# Patient Record
Sex: Male | Born: 1935 | Race: White | Hispanic: No | Smoking: Never smoker
Health system: Southern US, Community
[De-identification: ages and names within clinical notes are randomized; demographics above are authoritative.]

## PROBLEM LIST (undated history)

## (undated) DIAGNOSIS — Z951 Presence of aortocoronary bypass graft: Secondary | ICD-10-CM

## (undated) DIAGNOSIS — I639 Cerebral infarction, unspecified: Secondary | ICD-10-CM

## (undated) DIAGNOSIS — N400 Enlarged prostate without lower urinary tract symptoms: Secondary | ICD-10-CM

## (undated) DIAGNOSIS — M109 Gout, unspecified: Secondary | ICD-10-CM

## (undated) DIAGNOSIS — I4891 Unspecified atrial fibrillation: Secondary | ICD-10-CM

## (undated) DIAGNOSIS — I10 Essential (primary) hypertension: Secondary | ICD-10-CM

## (undated) DIAGNOSIS — K635 Polyp of colon: Secondary | ICD-10-CM

## (undated) DIAGNOSIS — M47812 Spondylosis without myelopathy or radiculopathy, cervical region: Secondary | ICD-10-CM

## (undated) DIAGNOSIS — W19XXXA Unspecified fall, initial encounter: Secondary | ICD-10-CM

## (undated) HISTORY — PX: CORONARY ARTERY BYPASS GRAFT: SHX141

## (undated) HISTORY — PX: TONSILLECTOMY: SUR1361

## (undated) HISTORY — PX: UPPER GASTROINTESTINAL ENDOSCOPY: SHX188

## (undated) HISTORY — PX: EYE MUSCLE SURGERY: SHX370

## (undated) HISTORY — PX: APPENDECTOMY: SHX54

## (undated) HISTORY — PX: COLONOSCOPY: SHX174

---

## 2001-07-04 ENCOUNTER — Inpatient Hospital Stay (HOSPITAL_COMMUNITY): Admission: EM | Admit: 2001-07-04 | Discharge: 2001-07-08 | Payer: Self-pay | Admitting: Emergency Medicine

## 2001-07-04 ENCOUNTER — Encounter: Payer: Self-pay | Admitting: Emergency Medicine

## 2013-12-19 ENCOUNTER — Encounter (HOSPITAL_COMMUNITY): Payer: Self-pay | Admitting: Emergency Medicine

## 2013-12-19 ENCOUNTER — Emergency Department (HOSPITAL_COMMUNITY): Payer: Medicare Other

## 2013-12-19 ENCOUNTER — Emergency Department (HOSPITAL_COMMUNITY)
Admission: EM | Admit: 2013-12-19 | Discharge: 2013-12-19 | Disposition: A | Discharge status: Home / Self Care | Payer: Medicare Other | Attending: Emergency Medicine | Admitting: Emergency Medicine

## 2013-12-19 ENCOUNTER — Inpatient Hospital Stay (HOSPITAL_COMMUNITY)
Admission: EM | Admit: 2013-12-19 | Discharge: 2013-12-23 | DRG: 378 | Disposition: A | Discharge status: Skilled Nursing Facility | Payer: Medicare Other | Attending: Internal Medicine | Admitting: Internal Medicine

## 2013-12-19 ENCOUNTER — Other Ambulatory Visit: Payer: Self-pay

## 2013-12-19 DIAGNOSIS — Z951 Presence of aortocoronary bypass graft: Secondary | ICD-10-CM | POA: Insufficient documentation

## 2013-12-19 DIAGNOSIS — R55 Syncope and collapse: Secondary | ICD-10-CM

## 2013-12-19 DIAGNOSIS — N400 Enlarged prostate without lower urinary tract symptoms: Secondary | ICD-10-CM | POA: Diagnosis present

## 2013-12-19 DIAGNOSIS — Z9089 Acquired absence of other organs: Secondary | ICD-10-CM

## 2013-12-19 DIAGNOSIS — Z860100 Personal history of colon polyps, unspecified: Secondary | ICD-10-CM

## 2013-12-19 DIAGNOSIS — R51 Headache: Secondary | ICD-10-CM | POA: Insufficient documentation

## 2013-12-19 DIAGNOSIS — I10 Essential (primary) hypertension: Secondary | ICD-10-CM | POA: Insufficient documentation

## 2013-12-19 DIAGNOSIS — W19XXXA Unspecified fall, initial encounter: Secondary | ICD-10-CM

## 2013-12-19 DIAGNOSIS — W010XXA Fall on same level from slipping, tripping and stumbling without subsequent striking against object, initial encounter: Secondary | ICD-10-CM | POA: Insufficient documentation

## 2013-12-19 DIAGNOSIS — E876 Hypokalemia: Secondary | ICD-10-CM | POA: Diagnosis not present

## 2013-12-19 DIAGNOSIS — I4891 Unspecified atrial fibrillation: Secondary | ICD-10-CM

## 2013-12-19 DIAGNOSIS — S0190XA Unspecified open wound of unspecified part of head, initial encounter: Secondary | ICD-10-CM | POA: Insufficient documentation

## 2013-12-19 DIAGNOSIS — I2510 Atherosclerotic heart disease of native coronary artery without angina pectoris: Secondary | ICD-10-CM | POA: Diagnosis present

## 2013-12-19 DIAGNOSIS — D62 Acute posthemorrhagic anemia: Secondary | ICD-10-CM | POA: Diagnosis present

## 2013-12-19 DIAGNOSIS — Z79899 Other long term (current) drug therapy: Secondary | ICD-10-CM | POA: Insufficient documentation

## 2013-12-19 DIAGNOSIS — Y92009 Unspecified place in unspecified non-institutional (private) residence as the place of occurrence of the external cause: Secondary | ICD-10-CM

## 2013-12-19 DIAGNOSIS — D689 Coagulation defect, unspecified: Secondary | ICD-10-CM | POA: Diagnosis present

## 2013-12-19 DIAGNOSIS — IMO0002 Reserved for concepts with insufficient information to code with codable children: Secondary | ICD-10-CM | POA: Diagnosis present

## 2013-12-19 DIAGNOSIS — E860 Dehydration: Secondary | ICD-10-CM | POA: Diagnosis present

## 2013-12-19 DIAGNOSIS — I959 Hypotension, unspecified: Secondary | ICD-10-CM | POA: Diagnosis present

## 2013-12-19 DIAGNOSIS — Y93E1 Activity, personal bathing and showering: Secondary | ICD-10-CM

## 2013-12-19 DIAGNOSIS — S0100XA Unspecified open wound of scalp, initial encounter: Secondary | ICD-10-CM | POA: Diagnosis present

## 2013-12-19 DIAGNOSIS — Z7901 Long term (current) use of anticoagulants: Secondary | ICD-10-CM

## 2013-12-19 DIAGNOSIS — Z888 Allergy status to other drugs, medicaments and biological substances status: Secondary | ICD-10-CM

## 2013-12-19 DIAGNOSIS — E785 Hyperlipidemia, unspecified: Secondary | ICD-10-CM | POA: Diagnosis present

## 2013-12-19 DIAGNOSIS — Z8673 Personal history of transient ischemic attack (TIA), and cerebral infarction without residual deficits: Secondary | ICD-10-CM

## 2013-12-19 DIAGNOSIS — K921 Melena: Principal | ICD-10-CM | POA: Diagnosis present

## 2013-12-19 DIAGNOSIS — M109 Gout, unspecified: Secondary | ICD-10-CM | POA: Diagnosis present

## 2013-12-19 DIAGNOSIS — S0191XA Laceration without foreign body of unspecified part of head, initial encounter: Secondary | ICD-10-CM

## 2013-12-19 DIAGNOSIS — D649 Anemia, unspecified: Secondary | ICD-10-CM

## 2013-12-19 DIAGNOSIS — K922 Gastrointestinal hemorrhage, unspecified: Secondary | ICD-10-CM

## 2013-12-19 DIAGNOSIS — E861 Hypovolemia: Secondary | ICD-10-CM | POA: Diagnosis present

## 2013-12-19 DIAGNOSIS — Z882 Allergy status to sulfonamides status: Secondary | ICD-10-CM

## 2013-12-19 DIAGNOSIS — Y9389 Activity, other specified: Secondary | ICD-10-CM | POA: Insufficient documentation

## 2013-12-19 HISTORY — DX: Benign prostatic hyperplasia without lower urinary tract symptoms: N40.0

## 2013-12-19 HISTORY — DX: Gout, unspecified: M10.9

## 2013-12-19 HISTORY — DX: Spondylosis without myelopathy or radiculopathy, cervical region: M47.812

## 2013-12-19 HISTORY — DX: Unspecified atrial fibrillation: I48.91

## 2013-12-19 HISTORY — DX: Presence of aortocoronary bypass graft: Z95.1

## 2013-12-19 HISTORY — DX: Essential (primary) hypertension: I10

## 2013-12-19 HISTORY — DX: Cerebral infarction, unspecified: I63.9

## 2013-12-19 HISTORY — DX: Polyp of colon: K63.5

## 2013-12-19 LAB — COMPREHENSIVE METABOLIC PANEL
ALK PHOS: 59 U/L (ref 39–117)
ALT: 15 U/L (ref 0–53)
AST: 17 U/L (ref 0–37)
Albumin: 2.9 g/dL — ABNORMAL LOW (ref 3.5–5.2)
BUN: 57 mg/dL — ABNORMAL HIGH (ref 6–23)
CHLORIDE: 106 meq/L (ref 96–112)
CO2: 21 mEq/L (ref 19–32)
Calcium: 8.4 mg/dL (ref 8.4–10.5)
Creatinine, Ser: 1.11 mg/dL (ref 0.50–1.35)
GFR calc Af Amer: 71 mL/min — ABNORMAL LOW (ref >90)
GFR calc non Af Amer: 61 mL/min — ABNORMAL LOW (ref >90)
GLUCOSE: 168 mg/dL — AB (ref 70–99)
POTASSIUM: 3.8 meq/L (ref 3.7–5.3)
Sodium: 142 mEq/L (ref 137–147)
TOTAL PROTEIN: 5.4 g/dL — AB (ref 6.0–8.3)
Total Bilirubin: 0.4 mg/dL (ref 0.3–1.2)

## 2013-12-19 LAB — POC OCCULT BLOOD, ED: FECAL OCCULT BLD: POSITIVE — AB

## 2013-12-19 LAB — I-STAT TROPONIN, ED: Troponin i, poc: 0.02 ng/mL (ref 0.00–0.08)

## 2013-12-19 LAB — URINALYSIS, ROUTINE W REFLEX MICROSCOPIC
Bilirubin Urine: NEGATIVE
Glucose, UA: NEGATIVE mg/dL
HGB URINE DIPSTICK: NEGATIVE
Ketones, ur: NEGATIVE mg/dL
Leukocytes, UA: NEGATIVE
Nitrite: NEGATIVE
Protein, ur: NEGATIVE mg/dL
SPECIFIC GRAVITY, URINE: 1.019 (ref 1.005–1.030)
Urobilinogen, UA: 0.2 mg/dL (ref 0.0–1.0)
pH: 5 (ref 5.0–8.0)

## 2013-12-19 LAB — CBC WITH DIFFERENTIAL/PLATELET
BASOS PCT: 0 % (ref 0–1)
Basophils Absolute: 0 10*3/uL (ref 0.0–0.1)
Eosinophils Absolute: 0 10*3/uL (ref 0.0–0.7)
Eosinophils Relative: 0 % (ref 0–5)
HCT: 13.9 % — ABNORMAL LOW (ref 39.0–52.0)
HEMOGLOBIN: 4.7 g/dL — AB (ref 13.0–17.0)
LYMPHS ABS: 0.5 10*3/uL — AB (ref 0.7–4.0)
Lymphocytes Relative: 5 % — ABNORMAL LOW (ref 12–46)
MCH: 32.6 pg (ref 26.0–34.0)
MCHC: 33.8 g/dL (ref 30.0–36.0)
MCV: 96.5 fL (ref 78.0–100.0)
Monocytes Absolute: 0.5 10*3/uL (ref 0.1–1.0)
Monocytes Relative: 6 % (ref 3–12)
NEUTROS ABS: 7.8 10*3/uL — AB (ref 1.7–7.7)
NEUTROS PCT: 89 % — AB (ref 43–77)
PLATELETS: 146 10*3/uL — AB (ref 150–400)
RBC: 1.44 MIL/uL — AB (ref 4.22–5.81)
RDW: 14.8 % (ref 11.5–15.5)
WBC: 8.8 10*3/uL (ref 4.0–10.5)

## 2013-12-19 LAB — MRSA PCR SCREENING: MRSA by PCR: NEGATIVE

## 2013-12-19 LAB — PROTIME-INR
INR: 1.51 — ABNORMAL HIGH (ref 0.00–1.49)
PROTHROMBIN TIME: 17.8 s — AB (ref 11.6–15.2)

## 2013-12-19 LAB — ABO/RH: ABO/RH(D): O POS

## 2013-12-19 LAB — PREPARE RBC (CROSSMATCH)

## 2013-12-19 MED ORDER — SODIUM CHLORIDE 0.9 % IV BOLUS (SEPSIS)
1000.0000 mL | Freq: Once | INTRAVENOUS | Status: AC
  Administered 2013-12-19: 1000 mL via INTRAVENOUS

## 2013-12-19 MED ORDER — TETANUS-DIPHTH-ACELL PERTUSSIS 5-2.5-18.5 LF-MCG/0.5 IM SUSP: 0.5000 mL | Freq: Once | INTRAMUSCULAR | Status: DC

## 2013-12-19 MED ORDER — PANTOPRAZOLE SODIUM 40 MG IV SOLR: 40.0000 mg | Freq: Two times a day (BID) | INTRAVENOUS | Status: DC

## 2013-12-19 MED ORDER — SODIUM CHLORIDE 0.9 % IV SOLN
80.0000 mg | Freq: Once | INTRAVENOUS | Status: AC
  Administered 2013-12-19: 80 mg via INTRAVENOUS
  Filled 2013-12-19: qty 80

## 2013-12-19 MED ORDER — SODIUM CHLORIDE 0.9 % IV SOLN
INTRAVENOUS | Status: DC
  Administered 2013-12-19: 19:00:00 via INTRAVENOUS

## 2013-12-19 MED ORDER — SODIUM CHLORIDE 0.9 % IV SOLN
8.0000 mg/h | INTRAVENOUS | Status: DC
  Administered 2013-12-19 – 2013-12-20 (×2): 8 mg/h via INTRAVENOUS
  Filled 2013-12-19 (×5): qty 80

## 2013-12-19 MED ORDER — PANTOPRAZOLE SODIUM 40 MG PO TBEC
80.0000 mg | DELAYED_RELEASE_TABLET | Freq: Once | ORAL | Status: DC
  Administered 2013-12-19: 80 mg via ORAL
  Filled 2013-12-19: qty 2

## 2013-12-19 NOTE — ED Notes (Signed)
Per ems, pt was seen here this fmorning for a fall, has four staples in the back of head. Per ems, pt was sitting on his walker, had a possible syncopal episode. Upon ems arrival, pt was pale, BP in the 70s. Upon arrival to ED, pt color vastly improved, denies pain, pt c/o genearlized weakness. Pt did not fall, but lost control of bladder. No EKG changes by ems. Pt is AAOX4. Dr. Silverio LayYao at bedside.

## 2013-12-19 NOTE — ED Notes (Signed)
Patient transported to CT 
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
## 2013-12-19 NOTE — ED Notes (Signed)
Dr. Silverio LayYao made aware pt had a black tarry stool and it was hemocculted.
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
## 2013-12-19 NOTE — ED Notes (Signed)
Assisted Mellody DanceKeith, EMT with placing pt on bedpan
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
## 2013-12-19 NOTE — Discharge Instructions (Signed)
Keep wound clean and dry.  Take tylenol for pain.   Staple removal in a week.   Follow up with your doctor.   Return to ER if you have severe headache, fever, purulent drainage from wound.

## 2013-12-19 NOTE — ED Notes (Signed)
GI at the bedside.

## 2013-12-19 NOTE — ED Notes (Signed)
Pt on bedpan attempting to have a BM

## 2013-12-19 NOTE — ED Notes (Signed)
Spoke with Dr.McQuaid for order clarification on blood and labs. Order to transfuse 2 units PRBCs and then draw labs.

## 2013-12-19 NOTE — ED Provider Notes (Signed)
CSN: [id number]     Arrival date & time 12/19/13  1336 History   First MD Initiated Contact with Patient 12/19/13 1345     Chief Complaint  Patient presents with  . Near Syncope     (Consider location/radiation/quality/duration/timing/severity/associated sxs/prior Treatment) The history is provided by the patient.  Mahlik Lenn is a 80 y.o. male hx of afib on eliquis, CAD s/p CABG, HTN here with near syncope. He was seen earlier by me for mechanical fall and had a scalp laceration that was stapled. He went home but didn't eat much. He was walking and suddenly appeared pale and almost passed out. He urinated on himself but does not have any seizure activity. He denies any chest pain or shortness of breath. Denies any fevers. Per EMS, his BP was in the 70s on arrival.    Past Medical History  Diagnosis Date  . Atrial fibrillation   . Hx of CABG   . Hypertension    Past Surgical History  Procedure Laterality Date  . Coronary artery bypass graft     No family history on file. History  Substance Use Topics  . Smoking status: Never Smoker   . Smokeless tobacco: Not on file  . Alcohol Use: No    Review of Systems  Cardiovascular: Positive for near-syncope.  Neurological: Positive for dizziness.       Near syncope   All other systems reviewed and are negative.      Allergies  Codeine and Sulfa antibiotics  Home Medications   Current Outpatient Rx  Name  Route  Sig  Dispense  Refill  . acetaminophen (TYLENOL) 650 MG CR tablet   Oral   Take 1,300 mg by mouth 2 (two) times daily.         Marland Kitchen alfuzosin (UROXATRAL) 10 MG 24 hr tablet   Oral   Take 10 mg by mouth daily with breakfast.         . allopurinol (ZYLOPRIM) 300 MG tablet   Oral   Take 300 mg by mouth daily.         Marland Kitchen apixaban (ELIQUIS) 5 MG TABS tablet   Oral   Take 5 mg by mouth 2 (two) times daily.         Marland Kitchen diltiazem (CARDIZEM CD) 240 MG 24 hr capsule   Oral   Take 240 mg by mouth daily.          . finasteride (PROSCAR) 5 MG tablet   Oral   Take 5 mg by mouth daily.         . Iron-FA-B Cmp-C-Biot-Probiotic (FUSION PLUS) CAPS   Oral   Take 1 capsule by mouth daily.         . montelukast (SINGULAIR) 10 MG tablet   Oral   Take 10 mg by mouth daily.         . Multiple Vitamin (MULTIVITAMIN WITH MINERALS) TABS tablet   Oral   Take 1 tablet by mouth daily.         . niacin (NIASPAN) 750 MG CR tablet   Oral   Take 1,500 mg by mouth at bedtime.         Marland Kitchen omeprazole (PRILOSEC) 40 MG capsule   Oral   Take 40 mg by mouth daily.         . ramipril (ALTACE) 5 MG capsule   Oral   Take 5 mg by mouth daily.          BP 91/35  Pulse 73  Temp(Src) 98.1 F (36.7 C) (Oral)  Resp 18  SpO2 100% Physical Exam  Nursing note and vitals reviewed. Constitutional:  Chronically ill, dehydrated   HENT:  Head: Normocephalic.  MM slightly dry   Eyes: Conjunctivae are normal. Pupils are equal, round, and reactive to light.  Neck: Normal range of motion. Neck supple.  Cardiovascular: Normal rate, regular rhythm and normal heart sounds.   Pulmonary/Chest: Effort normal and breath sounds normal. No respiratory distress. He has no wheezes. He has no rales.  Abdominal: Soft. Bowel sounds are normal. He exhibits no distension. There is no tenderness. There is no rebound.  Musculoskeletal: Normal range of motion. He exhibits no edema.  Neurological: He is alert.  4/5 strength throughout. CN 2-12 intact.   Skin: Skin is warm and dry.  L scalp laceration with staples in place. R elbow abrasion.   Psychiatric: He has a normal mood and affect. His behavior is normal. Judgment and thought content normal.    ED Course  Procedures (including critical care time)  CRITICAL CARE Performed by: Silverio LayYAO, Caswell Alvillar   Total critical care time: 30 min   Critical care time was exclusive of separately billable procedures and treating other patients.  Critical care was necessary to treat or  prevent imminent or life-threatening deterioration.  Critical care was time spent personally by me on the following activities: development of treatment plan with patient and/or surrogate as well as nursing, discussions with consultants, evaluation of patient's response to treatment, examination of patient, obtaining history from patient or surrogate, ordering and performing treatments and interventions, ordering and review of laboratory studies, ordering and review of radiographic studies, pulse oximetry and re-evaluation of patient's condition.   Labs Review Labs Reviewed  POC OCCULT BLOOD, ED - Abnormal; Notable for the following:    Fecal Occult Bld POSITIVE (*)    All other components within normal limits  URINALYSIS, ROUTINE W REFLEX MICROSCOPIC  CBC WITH DIFFERENTIAL  COMPREHENSIVE METABOLIC PANEL  PROTIME-INR  I-STAT TROPOININ, ED  TYPE AND SCREEN   Imaging Review Ct Head Wo Contrast  12/19/2013   CLINICAL DATA:  Fall with posterior head injury and occipital laceration. Also complaint of neck pain.  EXAM: CT HEAD WITHOUT CONTRAST  CT CERVICAL SPINE WITHOUT CONTRAST  TECHNIQUE: Multidetector CT imaging of the head and cervical spine was performed following the standard protocol without intravenous contrast. Multiplanar CT image reconstructions of the cervical spine were also generated.  COMPARISON:  None.  FINDINGS: CT HEAD FINDINGS  The brain shows evidence of mild atrophy and mild periventricular small vessel disease. The brain demonstrates no evidence of hemorrhage, infarction, edema, mass effect, extra-axial fluid collection, hydrocephalus or mass lesion. The skull is unremarkable and shows no evidence of fracture.  CT CERVICAL SPINE FINDINGS  The cervical spine shows normal alignment. There is no evidence of acute fracture or subluxation. No soft tissue swelling or hematoma is identified. Advanced degenerative changes are present throughout the cervical spine with near complete fused  disc spaces at C3-4, C4-5, C5-6 and C6-7. Associated proliferative changes are identified as well as probable significant bony foraminal stenosis at multiple levels. No bony or soft tissue lesions are seen. The visualized airway is normally patent.  IMPRESSION: 1. No acute findings by head CT. No evidence of skull fracture or intracranial hemorrhage. 2. Advanced degenerative changes of the cervical spine without evidence of cervical fracture or subluxation.   Electronically Signed   By: Irish LackGlenn  Yamagata M.D.   On: 12/19/2013  09:31   Ct Cervical Spine Wo Contrast  12/19/2013   CLINICAL DATA:  Fall with posterior head injury and occipital laceration. Also complaint of neck pain.  EXAM: CT HEAD WITHOUT CONTRAST  CT CERVICAL SPINE WITHOUT CONTRAST  TECHNIQUE: Multidetector CT imaging of the head and cervical spine was performed following the standard protocol without intravenous contrast. Multiplanar CT image reconstructions of the cervical spine were also generated.  COMPARISON:  None.  FINDINGS: CT HEAD FINDINGS  The brain shows evidence of mild atrophy and mild periventricular small vessel disease. The brain demonstrates no evidence of hemorrhage, infarction, edema, mass effect, extra-axial fluid collection, hydrocephalus or mass lesion. The skull is unremarkable and shows no evidence of fracture.  CT CERVICAL SPINE FINDINGS  The cervical spine shows normal alignment. There is no evidence of acute fracture or subluxation. No soft tissue swelling or hematoma is identified. Advanced degenerative changes are present throughout the cervical spine with near complete fused disc spaces at C3-4, C4-5, C5-6 and C6-7. Associated proliferative changes are identified as well as probable significant bony foraminal stenosis at multiple levels. No bony or soft tissue lesions are seen. The visualized airway is normally patent.  IMPRESSION: 1. No acute findings by head CT. No evidence of skull fracture or intracranial hemorrhage.  2. Advanced degenerative changes of the cervical spine without evidence of cervical fracture or subluxation.   Electronically Signed   By: Irish Lack M.D.   On: 12/19/2013 09:31   Dg Chest Portable 1 View  12/19/2013   CLINICAL DATA:  Near syncope.  EXAM: PORTABLE CHEST - 1 VIEW  COMPARISON:  None.  FINDINGS: Borderline enlarged cardiac silhouette. Post CABG changes. Clear lungs with normal vascularity. Mild central peribronchial thickening. Minimal scoliosis.  IMPRESSION: Borderline cardiomegaly and mild chronic bronchitic changes. No acute abnormality.   Electronically Signed   By: Gordan Payment M.D.   On: 12/19/2013 14:32     EKG Interpretation   Date/Time:  Saturday December 19 2013 13:53:49 EDT Ventricular Rate:  79 PR Interval:  148 QRS Duration: 84 QT Interval:  458 QTC Calculation: 525 R Axis:   39 Text Interpretation:  Sinus rhythm Abnrm T, consider ischemia,  anterolateral lds Prolonged QT interval TWI  new since previous Confirmed  by Cailynn Bodnar  MD, Brooke Payes ([id number]) on 12/19/2013 2:19:03 PM      MDM   Final diagnoses:  None   Sahil Milner is a 80 y.o. male here with near syncope, likely from dehydration. Will get labs, orthostatics. Will give IVF and likely need admission. No repeat head injury and will no do CT head again.   3 PM Patient has melena in the ED. Occ positive. Has hx of GI bleed and just moved from [place] so has no doctors here. Will call GI, transfuse if Hg low. Orthostatic and unable to stand up. Will hydrate and reassess.   3:27 PM Still hypotensive after 2.5 L. Critical care called and recommend starting protonix drip. Blood ordered. I called [facility] for consult.    Richardean Canal, MD 12/19/13 1556

## 2013-12-19 NOTE — ED Notes (Signed)
Patient returned from CT via CT tech, A. [employer]

## 2013-12-19 NOTE — ED Provider Notes (Addendum)
CSN: [id number]     Arrival date & time 12/19/13  [id number] History   First MD Initiated Contact with Patient 12/19/13 [phone]     Chief Complaint  Patient presents with  . Head Laceration  . Fall     (Consider location/radiation/quality/duration/timing/severity/associated sxs/prior Treatment) The history is provided by the patient and the EMS personnel.  Kristopher [name] is a 80 y.o. male hx of Afib on eliquis, CAD s/p CABG, HTN here with fall. He lives at independent living and this morning got up to go use the bathroom and slipped and fell. He says he hit the left side of his head on a And it and had a laceration afterwards. EMS noted that the bleeding stopped spontaneously. Has a mild headache. Denies syncope loss consciousness. He had some mild neck pain but he said he has chronic arthritis and is not getting worse. Denies any other extremity pain or chest pain or abdominal pain. Tetanus up to date.    Past Medical History  Diagnosis Date  . Atrial fibrillation   . Hx of CABG   . Hypertension    Past Surgical History  Procedure Laterality Date  . Coronary artery bypass graft     History reviewed. No pertinent family history. History  Substance Use Topics  . Smoking status: Never Smoker   . Smokeless tobacco: Not on file  . Alcohol Use: No    Review of Systems  Skin: Positive for wound.  Neurological: Positive for headaches.  All other systems reviewed and are negative.      Allergies  Codeine and Sulfa antibiotics  Home Medications  No current outpatient prescriptions on file. Pulse 85  Temp(Src) 98.4 F (36.9 C) (Oral)  Resp 21  SpO2 100% Physical Exam  Nursing note and vitals reviewed. Constitutional: He is oriented to person, place, and time.  Slightly uncomfortable   HENT:  Head: Normocephalic.  Mouth/Throat: Oropharynx is clear and moist.  2 cm laceration L scalp, no active bleeding. Well approximated  Eyes: Conjunctivae and EOM are normal. Pupils are equal,  round, and reactive to light.  Neck: Normal range of motion. Neck supple.  ? l sided neck tenderness   Cardiovascular: Normal rate, regular rhythm and normal heart sounds.   Pulmonary/Chest: Effort normal and breath sounds normal. No respiratory distress. He has no wheezes. He has no rales.  Abdominal: Soft. Bowel sounds are normal. He exhibits no distension. There is no tenderness. There is no rebound and no guarding.  Musculoskeletal:  Pelvis stable, no spinal tenderness. No extremity trauma   Neurological: He is alert and oriented to person, place, and time.  Skin: Skin is warm and dry.  Psychiatric: He has a normal mood and affect. His behavior is normal. Judgment and thought content normal.    ED Course  Procedures (including critical care time)  LACERATION REPAIR Performed by: Chaney MallingYAO, DAVID Authorized by: Chaney MallingYAO, DAVID Consent: Verbal consent obtained. Risks and benefits: risks, benefits and alternatives were discussed Consent given by: patient Patient identity confirmed: provided demographic data Prepped and Draped in normal sterile fashion Wound explored  Laceration Location: L scalp  Laceration Length: 2 cm  No Foreign Bodies seen or palpated  Anesthesia: none   Irrigation method: syringe Amount of cleaning: standard  Skin closure: staples  Number of staples- 4   Patient tolerance: Patient tolerated the procedure well with no immediate complications.   Labs Review Labs Reviewed - No data to display Imaging Review Ct Head Wo Contrast  12/19/2013  CLINICAL DATA:  Fall with posterior head injury and occipital laceration. Also complaint of neck pain.  EXAM: CT HEAD WITHOUT CONTRAST  CT CERVICAL SPINE WITHOUT CONTRAST  TECHNIQUE: Multidetector CT imaging of the head and cervical spine was performed following the standard protocol without intravenous contrast. Multiplanar CT image reconstructions of the cervical spine were also generated.  COMPARISON:  None.  FINDINGS:  CT HEAD FINDINGS  The brain shows evidence of mild atrophy and mild periventricular small vessel disease. The brain demonstrates no evidence of hemorrhage, infarction, edema, mass effect, extra-axial fluid collection, hydrocephalus or mass lesion. The skull is unremarkable and shows no evidence of fracture.  CT CERVICAL SPINE FINDINGS  The cervical spine shows normal alignment. There is no evidence of acute fracture or subluxation. No soft tissue swelling or hematoma is identified. Advanced degenerative changes are present throughout the cervical spine with near complete fused disc spaces at C3-4, C4-5, C5-6 and C6-7. Associated proliferative changes are identified as well as probable significant bony foraminal stenosis at multiple levels. No bony or soft tissue lesions are seen. The visualized airway is normally patent.  IMPRESSION: 1. No acute findings by head CT. No evidence of skull fracture or intracranial hemorrhage. 2. Advanced degenerative changes of the cervical spine without evidence of cervical fracture or subluxation.   Electronically Signed   By: Irish Lack M.D.   On: 12/19/2013 09:31   Ct Cervical Spine Wo Contrast  12/19/2013   CLINICAL DATA:  Fall with posterior head injury and occipital laceration. Also complaint of neck pain.  EXAM: CT HEAD WITHOUT CONTRAST  CT CERVICAL SPINE WITHOUT CONTRAST  TECHNIQUE: Multidetector CT imaging of the head and cervical spine was performed following the standard protocol without intravenous contrast. Multiplanar CT image reconstructions of the cervical spine were also generated.  COMPARISON:  None.  FINDINGS: CT HEAD FINDINGS  The brain shows evidence of mild atrophy and mild periventricular small vessel disease. The brain demonstrates no evidence of hemorrhage, infarction, edema, mass effect, extra-axial fluid collection, hydrocephalus or mass lesion. The skull is unremarkable and shows no evidence of fracture.  CT CERVICAL SPINE FINDINGS  The cervical  spine shows normal alignment. There is no evidence of acute fracture or subluxation. No soft tissue swelling or hematoma is identified. Advanced degenerative changes are present throughout the cervical spine with near complete fused disc spaces at C3-4, C4-5, C5-6 and C6-7. Associated proliferative changes are identified as well as probable significant bony foraminal stenosis at multiple levels. No bony or soft tissue lesions are seen. The visualized airway is normally patent.  IMPRESSION: 1. No acute findings by head CT. No evidence of skull fracture or intracranial hemorrhage. 2. Advanced degenerative changes of the cervical spine without evidence of cervical fracture or subluxation.   Electronically Signed   By: Irish Lack M.D.   On: 12/19/2013 09:31     EKG Interpretation None      Date: 12/19/2013  Rate: 85  Rhythm: normal sinus rhythm  QRS Axis: normal  Intervals: normal  ST/T Wave abnormalities: nonspecific ST changes  Conduction Disutrbances:none  Narrative Interpretation:   Old EKG Reviewed: none available    MDM   Final diagnoses:  None    Shiraz Bastyr is a 80 y.o. male here with fall. Will do CT given that he is on eliquis. Will staple laceration.   10:06 AM CT showed no bleed. Wound stapled. Will d/c home. Staple removal in a week.    Richardean Canal, MD 12/19/13 1007  Richardean Canal, MD 12/19/13 1018

## 2013-12-19 NOTE — ED Notes (Signed)
CCM at the bedside. °

## 2013-12-20 ENCOUNTER — Encounter (HOSPITAL_COMMUNITY)
Admission: EM | Disposition: A | Discharge status: Skilled Nursing Facility | Payer: Self-pay | Source: Home / Self Care | Attending: Pulmonary Disease

## 2013-12-20 DIAGNOSIS — I959 Hypotension, unspecified: Secondary | ICD-10-CM

## 2013-12-20 HISTORY — PX: ESOPHAGOGASTRODUODENOSCOPY: SHX5428

## 2013-12-20 LAB — CBC
HEMATOCRIT: 24.5 % — AB (ref 39.0–52.0)
Hemoglobin: 8.4 g/dL — ABNORMAL LOW (ref 13.0–17.0)
MCH: 31.8 pg (ref 26.0–34.0)
MCHC: 34.3 g/dL (ref 30.0–36.0)
MCV: 92.8 fL (ref 78.0–100.0)
PLATELETS: 107 10*3/uL — AB (ref 150–400)
RBC: 2.64 MIL/uL — ABNORMAL LOW (ref 4.22–5.81)
RDW: 15.8 % — AB (ref 11.5–15.5)
WBC: 7 10*3/uL (ref 4.0–10.5)

## 2013-12-20 LAB — HEMOGLOBIN AND HEMATOCRIT, BLOOD
HCT: 17.6 % — ABNORMAL LOW (ref 39.0–52.0)
Hemoglobin: 6.1 g/dL — CL (ref 13.0–17.0)

## 2013-12-20 LAB — COMPREHENSIVE METABOLIC PANEL
ALK PHOS: 65 U/L (ref 39–117)
ALT: 21 U/L (ref 0–53)
AST: 23 U/L (ref 0–37)
Albumin: 2.4 g/dL — ABNORMAL LOW (ref 3.5–5.2)
BUN: 41 mg/dL — ABNORMAL HIGH (ref 6–23)
CHLORIDE: 110 meq/L (ref 96–112)
CO2: 21 meq/L (ref 19–32)
Calcium: 7.7 mg/dL — ABNORMAL LOW (ref 8.4–10.5)
Creatinine, Ser: 1 mg/dL (ref 0.50–1.35)
GFR calc Af Amer: 80 mL/min — ABNORMAL LOW (ref >90)
GFR, EST NON AFRICAN AMERICAN: 69 mL/min — AB (ref >90)
GLUCOSE: 106 mg/dL — AB (ref 70–99)
POTASSIUM: 3.7 meq/L (ref 3.7–5.3)
SODIUM: 141 meq/L (ref 137–147)
Total Bilirubin: 0.6 mg/dL (ref 0.3–1.2)
Total Protein: 4.7 g/dL — ABNORMAL LOW (ref 6.0–8.3)

## 2013-12-20 LAB — BASIC METABOLIC PANEL
BUN: 35 mg/dL — AB (ref 6–23)
CO2: 19 mEq/L (ref 19–32)
CREATININE: 1.03 mg/dL (ref 0.50–1.35)
Calcium: 8 mg/dL — ABNORMAL LOW (ref 8.4–10.5)
Chloride: 109 mEq/L (ref 96–112)
GFR, EST AFRICAN AMERICAN: 78 mL/min — AB (ref >90)
GFR, EST NON AFRICAN AMERICAN: 67 mL/min — AB (ref >90)
Glucose, Bld: 89 mg/dL (ref 70–99)
Potassium: 3.6 mEq/L — ABNORMAL LOW (ref 3.7–5.3)
Sodium: 141 mEq/L (ref 137–147)

## 2013-12-20 LAB — LACTIC ACID, PLASMA: Lactic Acid, Venous: 0.9 mmol/L (ref 0.5–2.2)

## 2013-12-20 LAB — PREPARE RBC (CROSSMATCH)

## 2013-12-20 LAB — MAGNESIUM: Magnesium: 1.8 mg/dL (ref 1.5–2.5)

## 2013-12-20 LAB — PHOSPHORUS: Phosphorus: 3.1 mg/dL (ref 2.3–4.6)

## 2013-12-20 SURGERY — EGD (ESOPHAGOGASTRODUODENOSCOPY)
Anesthesia: Moderate Sedation

## 2013-12-20 MED ORDER — MIDAZOLAM HCL 5 MG/ML IJ SOLN
INTRAMUSCULAR | Status: AC
  Filled 2013-12-20: qty 1

## 2013-12-20 MED ORDER — BUTAMBEN-TETRACAINE-BENZOCAINE 2-2-14 % EX AERO
INHALATION_SPRAY | CUTANEOUS | Status: DC | PRN
  Administered 2013-12-20: 2 via TOPICAL

## 2013-12-20 MED ORDER — FENTANYL CITRATE 0.05 MG/ML IJ SOLN
INTRAMUSCULAR | Status: AC
  Filled 2013-12-20: qty 2

## 2013-12-20 MED ORDER — CHLORHEXIDINE GLUCONATE 0.12 % MT SOLN
15.0000 mL | Freq: Two times a day (BID) | OROMUCOSAL | Status: DC
  Administered 2013-12-20: 15 mL via OROMUCOSAL
  Filled 2013-12-20: qty 15

## 2013-12-20 MED ORDER — LIDOCAINE VISCOUS 2 % MT SOLN
15.0000 mL | Freq: Once | OROMUCOSAL | Status: AC
  Administered 2013-12-20: 15 mL via OROMUCOSAL
  Filled 2013-12-20: qty 15

## 2013-12-20 MED ORDER — ACETAMINOPHEN 325 MG PO TABS: 650.0000 mg | ORAL_TABLET | ORAL | Status: DC | PRN

## 2013-12-20 MED ORDER — POTASSIUM CHLORIDE 10 MEQ/100ML IV SOLN
10.0000 meq | INTRAVENOUS | Status: AC
  Administered 2013-12-20 (×4): 10 meq via INTRAVENOUS
  Filled 2013-12-20 (×4): qty 100

## 2013-12-20 MED ORDER — DIPHENHYDRAMINE HCL 50 MG/ML IJ SOLN: 25.0000 mg | Freq: Four times a day (QID) | INTRAMUSCULAR | Status: DC | PRN

## 2013-12-20 MED ORDER — MIDAZOLAM HCL 10 MG/2ML IJ SOLN
INTRAMUSCULAR | Status: DC | PRN
  Administered 2013-12-20 (×2): 2 mg via INTRAVENOUS

## 2013-12-20 MED ORDER — FENTANYL CITRATE 0.05 MG/ML IJ SOLN
INTRAMUSCULAR | Status: DC | PRN
  Administered 2013-12-20 (×2): 25 ug via INTRAVENOUS

## 2013-12-20 MED ORDER — BIOTENE DRY MOUTH MT LIQD
15.0000 mL | Freq: Two times a day (BID) | OROMUCOSAL | Status: DC
  Administered 2013-12-20: 15 mL via OROMUCOSAL

## 2013-12-20 MED ORDER — PANTOPRAZOLE SODIUM 40 MG PO TBEC
40.0000 mg | DELAYED_RELEASE_TABLET | Freq: Every day | ORAL | Status: DC
  Administered 2013-12-21 – 2013-12-23 (×3): 40 mg via ORAL
  Filled 2013-12-20 (×3): qty 1

## 2013-12-20 NOTE — Op Note (Signed)
[facility] [address] [place], [id number]   ENDOSCOPY PROCEDURE REPORT  PATIENT: Kristopher CitizenBecton, Lamere  MR#: [id number] BIRTHDATE: 1932-08-18 , 81  yrs. old GENDER: Male ENDOSCOPIST: Iva Booparl E Chelcey Caputo, MD, [facility] PROCEDURE DATE:  12/20/2013 PROCEDURE:  EGD, diagnostic ASA CLASS:     Class III INDICATIONS:  GI bleed on apixaban - melena. MEDICATIONS: Fentanyl 50 mcg IV and Versed 4 mg IV TOPICAL ANESTHETIC: Cetacaine Spray  DESCRIPTION OF PROCEDURE: After the risks benefits and alternatives of the procedure were thoroughly explained, informed consent was obtained.  The Pentax Gastroscope [id number] endoscope was introduced through the mouth and advanced to the second portion of the duodenum. Without limitations.  The instrument was slowly withdrawn as the mucosa was fully examined.      The upper, middle and distal third of the esophagus were carefully inspected and no abnormalities were noted.  The z-line was well seen at the GEJ.  The endoscope was pushed into the fundus which was normal including a retroflexed view.  The antrum, gastric body, first and second part of the duodenum were unremarkable. Retroflexed views revealed no abnormalities.     The scope was then withdrawn from the patient and the procedure completed.  COMPLICATIONS: There were no complications. ENDOSCOPIC IMPRESSION: Normal EGD  RECOMMENDATIONS: Feed [facility] when he had a "Coumadin bleed" If colonoscopy was truly negative then would do a capsule endoscopy of small bowel as opposed to repeating colonoscopy Stop PPI infusion - oral PPI reasonable Stay off anti-coagulant for now   eSigned:  Iva Booparl E Jasneet Schobert, MD, [facility] 12/20/2013 11:26 AM

## 2013-12-20 NOTE — Progress Notes (Signed)
eLink Physician-Brief Progress Note Patient Name: Eulah Citizendwin Boice DOB: 12-Jun-1932 MRN: [id number]  Date of Service  12/20/2013   HPI/Events of Note   Suspected upper GI hemorrhage on Eliquis Transfused 2 units PRBC  Recent Labs Lab 12/19/13 1401 12/20/13 0025  HGB 4.7* 6.1*     eICU Interventions   Transfuse 2 more units PRBC Place NG tube    Intervention Category Major Interventions: Hemorrhage - evaluation and management  Lorri Fukuhara 12/20/2013, 1:34 AM

## 2013-12-20 NOTE — Progress Notes (Signed)
Attempted to insert NGT using lidocaine viscous by two RNs without success. Pt did not tolerate well and refused another attempt. Educated pt on the need for NGT, pt continues to refuse at this time.

## 2013-12-20 NOTE — Progress Notes (Signed)
PULMONARY / CRITICAL CARE MEDICINE   Name: Kristopher [name] MRN: [id number] DOB: 01-Jul-1932    ADMISSION DATE:  12/19/2013 CONSULTATION DATE:  3/281/5   REFERRING MD :  EDP  PRIMARY SERVICE: PCCM   CHIEF COMPLAINT:  GIB and Hypotension   BRIEF PATIENT DESCRIPTION: 80 yo [facility] pt brought to ER for syncope found to be severely anemia w/ GIB (black tarry stools) on Eliquis for chronic atrial fib. PCCM 3/28 asked to admit .   SIGNIFICANT EVENTS / STUDIES:  3/28 GI consult for GIB  LINES / TUBES: PIV  CULTURES: None  ANTIBIOTICS: None  SUBJECTIVE: No events overnight.  Black stool.  VITAL SIGNS: Temp:  [97.9 F (36.6 C)-99.4 F (37.4 C)] 98.4 F (36.9 C) (03/29 0718) Pulse Rate:  [67-89] 67 (03/29 0700) Resp:  [12-24] 15 (03/29 0700) BP: (79-137)/(34-98) 127/58 mmHg (03/29 0700) SpO2:  [91 %-100 %] 91 % (03/29 0700) Weight:  [165 lb 12.6 oz (75.2 kg)-166 lb 3.6 oz (75.4 kg)] 166 lb 3.6 oz (75.4 kg) (03/29 0245) HEMODYNAMICS:   VENTILATOR SETTINGS:   INTAKE / OUTPUT: Intake/Output     03/28 0701 - 03/29 0700 03/29 0701 - 03/30 0700   I.V. (mL/kg) 14318 (189.9)    Blood 1299    Total Intake(mL/kg) 15617 (207.1)    Urine (mL/kg/hr) 1555    Stool 1    Total Output 1556     Net +14061           PHYSICAL EXAMINATION: General:  Frail , thin elderly male , very pale  Neuro:  Intact , a/o x 3 , f/c  HEENT:  Pale and dry mucosa Cardiovascular: RRR , no m/r/g Lungs:  CTA  Abdomen: mild gen tenderness, no guarding or rebound  Musculoskeletal:  inatct  Skin:  Pale , no rash   LABS:  CBC  Recent Labs Lab 12/19/13 1401 12/20/13 0025 12/20/13 0730  WBC 8.8  --  7.0  HGB 4.7* 6.1* 8.4*  HCT 13.9* 17.6* 24.5*  PLT 146*  --  107*   Coag's  Recent Labs Lab 12/19/13 1401  INR 1.51*   BMET  Recent Labs Lab 12/19/13 1401 12/20/13 0025 12/20/13 0730  NA 142 141 141  K 3.8 3.7 3.6*  CL 106 110 109  CO2 21 21 19   BUN 57* 41* 35*  CREATININE 1.11 1.00 1.03   GLUCOSE 168* 106* 89   Electrolytes  Recent Labs Lab 12/19/13 1401 12/20/13 0025 12/20/13 0730  CALCIUM 8.4 7.7* 8.0*  MG  --  1.8  --   PHOS  --  3.1  --    Sepsis Markers  Recent Labs Lab 12/20/13 0025  LATICACIDVEN 0.9   ABG No results found for this basename: PHART, PCO2ART, PO2ART,  in the last 168 hours Liver Enzymes  Recent Labs Lab 12/19/13 1401 12/20/13 0025  AST 17 23  ALT 15 21  ALKPHOS 59 65  BILITOT 0.4 0.6  ALBUMIN 2.9* 2.4*   Cardiac Enzymes No results found for this basename: TROPONINI, PROBNP,  in the last 168 hours Glucose No results found for this basename: GLUCAP,  in the last 168 hours  Imaging Dg Pelvis 1-2 Views  12/19/2013   CLINICAL DATA:  Bilateral hip pain following a fall today. Chronic bilateral hip pain, worse on the left.  EXAM: PELVIS - 1-2 VIEW  COMPARISON:  None.  FINDINGS: Marked superior left hip joint space narrowing and lateral subluxation of the femoral head with associated subarticular sclerosis and  cystic changes on both sides of the joint space. No fracture or dislocation is seen. Diffuse osteopenia. Lower lumbar spine degenerative changes and mild scoliosis.  IMPRESSION: 1. No fracture or dislocation. 2. Marked chronic left hip degenerative changes and subluxation.   Electronically Signed   By: Gordan Payment M.D.   On: 12/19/2013 14:35   Ct Head Wo Contrast  12/19/2013   CLINICAL DATA:  Fall with posterior head injury and occipital laceration. Also complaint of neck pain.  EXAM: CT HEAD WITHOUT CONTRAST  CT CERVICAL SPINE WITHOUT CONTRAST  TECHNIQUE: Multidetector CT imaging of the head and cervical spine was performed following the standard protocol without intravenous contrast. Multiplanar CT image reconstructions of the cervical spine were also generated.  COMPARISON:  None.  FINDINGS: CT HEAD FINDINGS  The brain shows evidence of mild atrophy and mild periventricular small vessel disease. The brain demonstrates no evidence of  hemorrhage, infarction, edema, mass effect, extra-axial fluid collection, hydrocephalus or mass lesion. The skull is unremarkable and shows no evidence of fracture.  CT CERVICAL SPINE FINDINGS  The cervical spine shows normal alignment. There is no evidence of acute fracture or subluxation. No soft tissue swelling or hematoma is identified. Advanced degenerative changes are present throughout the cervical spine with near complete fused disc spaces at C3-4, C4-5, C5-6 and C6-7. Associated proliferative changes are identified as well as probable significant bony foraminal stenosis at multiple levels. No bony or soft tissue lesions are seen. The visualized airway is normally patent.  IMPRESSION: 1. No acute findings by head CT. No evidence of skull fracture or intracranial hemorrhage. 2. Advanced degenerative changes of the cervical spine without evidence of cervical fracture or subluxation.   Electronically Signed   By: Irish Lack M.D.   On: 12/19/2013 09:31   Ct Cervical Spine Wo Contrast  12/19/2013   CLINICAL DATA:  Fall with posterior head injury and occipital laceration. Also complaint of neck pain.  EXAM: CT HEAD WITHOUT CONTRAST  CT CERVICAL SPINE WITHOUT CONTRAST  TECHNIQUE: Multidetector CT imaging of the head and cervical spine was performed following the standard protocol without intravenous contrast. Multiplanar CT image reconstructions of the cervical spine were also generated.  COMPARISON:  None.  FINDINGS: CT HEAD FINDINGS  The brain shows evidence of mild atrophy and mild periventricular small vessel disease. The brain demonstrates no evidence of hemorrhage, infarction, edema, mass effect, extra-axial fluid collection, hydrocephalus or mass lesion. The skull is unremarkable and shows no evidence of fracture.  CT CERVICAL SPINE FINDINGS  The cervical spine shows normal alignment. There is no evidence of acute fracture or subluxation. No soft tissue swelling or hematoma is identified. Advanced  degenerative changes are present throughout the cervical spine with near complete fused disc spaces at C3-4, C4-5, C5-6 and C6-7. Associated proliferative changes are identified as well as probable significant bony foraminal stenosis at multiple levels. No bony or soft tissue lesions are seen. The visualized airway is normally patent.  IMPRESSION: 1. No acute findings by head CT. No evidence of skull fracture or intracranial hemorrhage. 2. Advanced degenerative changes of the cervical spine without evidence of cervical fracture or subluxation.   Electronically Signed   By: Irish Lack M.D.   On: 12/19/2013 09:31   Dg Chest Portable 1 View  12/19/2013   CLINICAL DATA:  Near syncope.  EXAM: PORTABLE CHEST - 1 VIEW  COMPARISON:  None.  FINDINGS: Borderline enlarged cardiac silhouette. Post CABG changes. Clear lungs with normal vascularity. Mild central peribronchial  thickening. Minimal scoliosis.  IMPRESSION: Borderline cardiomegaly and mild chronic bronchitic changes. No acute abnormality.   Electronically Signed   By: Gordan PaymentSteve  Reid M.D.   On: 12/19/2013 14:32   CXR: Borderline cardiomegaly and mild chronic bronchitic changes. No  acute abnormality.    ASSESSMENT / PLAN:  PULMONARY A:No apparent acute issues /Sats adequate on RA and CXR -nad   P:   Cont to monitor. D/C O2.  CARDIOVASCULAR A: Hypotension secondary to hypovolemia/GIB  Atrial Fib  P:  Hold HTN rx as b/p low  KVO IVF D/C pressors. SCD  Hold eliquis   RENAL A:  BPH  P:   Hold proscar /uroxatral as may lower b/p  Replace K  GASTROINTESTINAL A:  GIB on Eliquis  P:   GI consulted, ?EGD this AM. Cont PPI drip x72 hr..  HEMATOLOGIC A:  Severe Anemia with GIB   P:  Transfused and HG is now stable. D/C H&H q6. NPO may eat after EGD. CBC q6.  INFECTIOUS A:  No apparent infection  P:   Tr wbc/temp   ENDOCRINE A:   P:   Monitor labs closely , add SSI if hyperglycemia occurs   NEUROLOGIC A:  Fall  CT head  3/28 w/ no acute findings   P:   Monitor closely as fall this am .   TODAY'S SUMMARY: UGI bleed on eliquis.  Hg stable.  GI saw yesterday, to determine ?EGD or not.  Continue PPI.  If no EGD then begin diet or begin after EGD will defer to GI.  [facility], [facility]M will sign off, please call back if needed.  I have personally obtained a history, examined the patient, evaluated laboratory and imaging results, formulated the assessment and plan and placed orders.  Alyson ReedyWesam G. Samanthia Howland, M.D. [facility]. Pager: [phone]. After hours pager: [phone].

## 2013-12-21 ENCOUNTER — Encounter (HOSPITAL_COMMUNITY): Payer: Self-pay | Admitting: Internal Medicine

## 2013-12-21 DIAGNOSIS — I4891 Unspecified atrial fibrillation: Secondary | ICD-10-CM

## 2013-12-21 LAB — BASIC METABOLIC PANEL
BUN: 20 mg/dL (ref 6–23)
CO2: 22 mEq/L (ref 19–32)
Calcium: 8.2 mg/dL — ABNORMAL LOW (ref 8.4–10.5)
Chloride: 109 mEq/L (ref 96–112)
Creatinine, Ser: 0.92 mg/dL (ref 0.50–1.35)
GFR, EST NON AFRICAN AMERICAN: 78 mL/min — AB (ref >90)
Glucose, Bld: 97 mg/dL (ref 70–99)
POTASSIUM: 3.8 meq/L (ref 3.7–5.3)
Sodium: 141 mEq/L (ref 137–147)

## 2013-12-21 LAB — CBC
HCT: 26.9 % — ABNORMAL LOW (ref 39.0–52.0)
Hemoglobin: 9.1 g/dL — ABNORMAL LOW (ref 13.0–17.0)
MCH: 31.7 pg (ref 26.0–34.0)
MCHC: 33.8 g/dL (ref 30.0–36.0)
MCV: 93.7 fL (ref 78.0–100.0)
PLATELETS: 113 10*3/uL — AB (ref 150–400)
RBC: 2.87 MIL/uL — ABNORMAL LOW (ref 4.22–5.81)
RDW: 16.9 % — AB (ref 11.5–15.5)
WBC: 8.1 10*3/uL (ref 4.0–10.5)

## 2013-12-21 LAB — MAGNESIUM: Magnesium: 2 mg/dL (ref 1.5–2.5)

## 2013-12-21 LAB — PHOSPHORUS: PHOSPHORUS: 3.2 mg/dL (ref 2.3–4.6)

## 2013-12-21 MED ORDER — MONTELUKAST SODIUM 10 MG PO TABS
10.0000 mg | ORAL_TABLET | Freq: Every day | ORAL | Status: DC
  Administered 2013-12-21 – 2013-12-23 (×3): 10 mg via ORAL
  Filled 2013-12-21 (×3): qty 1

## 2013-12-21 MED ORDER — PEG-KCL-NACL-NASULF-NA ASC-C 100 G PO SOLR
0.5000 | Freq: Once | ORAL | Status: AC
  Administered 2013-12-21: 100 g via ORAL
  Filled 2013-12-21: qty 1

## 2013-12-21 MED ORDER — PEG-KCL-NACL-NASULF-NA ASC-C 100 G PO SOLR: 1.0000 | Freq: Once | ORAL | Status: DC

## 2013-12-21 MED ORDER — PEG-KCL-NACL-NASULF-NA ASC-C 100 G PO SOLR
0.5000 | Freq: Once | ORAL | Status: AC
  Administered 2013-12-22: 100 g via ORAL
  Filled 2013-12-21: qty 1

## 2013-12-21 MED ORDER — ALLOPURINOL 300 MG PO TABS
300.0000 mg | ORAL_TABLET | Freq: Every day | ORAL | Status: DC
  Administered 2013-12-21 – 2013-12-23 (×3): 300 mg via ORAL
  Filled 2013-12-21 (×3): qty 1

## 2013-12-21 MED ORDER — FINASTERIDE 5 MG PO TABS
5.0000 mg | ORAL_TABLET | Freq: Every day | ORAL | Status: DC
  Administered 2013-12-21 – 2013-12-23 (×3): 5 mg via ORAL
  Filled 2013-12-21 (×3): qty 1

## 2013-12-21 MED ORDER — ALFUZOSIN HCL ER 10 MG PO TB24
10.0000 mg | ORAL_TABLET | Freq: Every day | ORAL | Status: DC
  Administered 2013-12-23: 10 mg via ORAL
  Filled 2013-12-21 (×3): qty 1

## 2013-12-21 NOTE — Progress Notes (Signed)
Report called to 5W RN, [name]. Pt transferred to 5W-06 via ambulation (13800ft) and wheelchair. Belongings, family at bedside. Meds in chart, bedside handoff to [name]. No questions or complaints at this time.  [name]
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
## 2013-12-21 NOTE — Progress Notes (Signed)
Daily Rounding Note  12/21/2013, 10:50 AM  LOS: 2 days   SUBJECTIVE:       BM last night was dark.  4 today have been loose and brown.  No blood.  Tolerating solids.  No abdominal pain or n/V.  Sinus rhythm since arrival to 2300.   OBJECTIVE:         Vital signs in last 24 hours:    Temp:  [97.6 F (36.4 C)-98.7 F (37.1 C)] 97.9 F (36.6 C) (03/30 0721) Pulse Rate:  [54-87] 80 (03/30 1000) Resp:  [13-24] 24 (03/30 1000) BP: (102-154)/(44-89) 136/77 mmHg (03/30 1000) SpO2:  [90 %-100 %] 97 % (03/30 1000) Weight:  [75.2 kg (165 lb 12.6 oz)] 75.2 kg (165 lb 12.6 oz) (03/30 0500) Last BM Date: 12/20/13 General: looks pale and weak.  comfortable   Heart: RRR Chest: clear bil.  No dyspnea Abdomen: soft, NT, ND.  Active BS  Extremities: no CCE Neuro/Psych:  Pleasant, oriented x 3.  No limb weakness.   Intake/Output from previous day: 03/29 0701 - 03/30 0700 In: 775 [P.O.:240; I.V.:135; IV Piggyback:400] Out: 2500 [Urine:2500]  Intake/Output this shift: Total I/O In: -  Out: 200 [Urine:200]  Lab Results:  Recent Labs  12/19/13 1401 12/20/13 0025 12/20/13 0730 12/21/13 0245  WBC 8.8  --  7.0 8.1  HGB 4.7* 6.1* 8.4* 9.1*  HCT 13.9* 17.6* 24.5* 26.9*  PLT 146*  --  107* 113*   BMET  Recent Labs  12/20/13 0025 12/20/13 0730 12/21/13 0813  NA 141 141 141  K 3.7 3.6* 3.8  CL 110 109 109  CO2 21 19 22   GLUCOSE 106* 89 97  BUN 41* 35* 20  CREATININE 1.00 1.03 0.92  CALCIUM 7.7* 8.0* 8.2*   LFT  Recent Labs  12/19/13 1401 12/20/13 0025  PROT 5.4* 4.7*  ALBUMIN 2.9* 2.4*  AST 17 23  ALT 15 21  ALKPHOS 59 65  BILITOT 0.4 0.6   PT/INR  Recent Labs  12/19/13 1401  LABPROT 17.8*  INR 1.51*   Hepatitis Panel No results found for this basename: HEPBSAG, HCVAB, HEPAIGM, HEPBIGM,  in the last 72 hours  Studies/Results: Dg Pelvis 1-2 Views 12/19/2013   FINDINGS: Marked superior left hip  joint space narrowing and lateral subluxation of the femoral head with associated subarticular sclerosis and cystic changes on both sides of the joint space. No fracture or dislocation is seen. Diffuse osteopenia. Lower lumbar spine degenerative changes and mild scoliosis.  IMPRESSION: 1. No fracture or dislocation. 2. Marked chronic left hip degenerative changes and subluxation.   Electronically Signed   By: Gordan Payment M.D.   On: 12/19/2013 14:35   Dg Chest Portable 1 View 12/19/2013   IMPRESSION: Borderline cardiomegaly and mild chronic bronchitic changes. No acute abnormality.   Electronically Signed   By: Gordan Payment M.D.   On: 12/19/2013 14:32   Scheduled Meds: . pantoprazole  40 mg Oral QAC breakfast   Continuous Infusions:  PRN Meds:.acetaminophen, diphenhydrAMINE  GI records for [facility] 09/2012.   Workup for iron def anemia. Required transfusion and switched from Coumadin to Eliquis then.   Colonoscopy 10/20/2012: by Dr Demetrius Charity Anne Shutter.  Cecal telangectasia, internal hemorrhoids. No polyps. 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    ASSESMENT:   * GI bleed with Heme + stool in setting of chronic Apixaban.  EGD 3/29: normal. On daily  Omeprazole 40 mg at home. Currently on daily oral Protonix.  Colonoscopy 09/2012:  Cecal telangectasia and int rrhoids.   *  ABL anemia. Normocytic.  S/p PRBC x 4. Hgb improved and stable.  On po iron supplement at admission for hx IDA anemia.    *  Atrial Fib.  Chronic anticoagulation.  Abixaban on hold.      PLAN   *  Next step for eval of anemia may be capsule endo an/or repeat colonoscopy with goal of ablating cecal AVMs?      Jennye MoccasinSarah Gribbin  12/21/2013, 10:50 AM Pager: [phone]  ________________________________________________________________________  [facility] MD note:  I personally examined the patient, reviewed the data and agree with the assessment and plan described  above.  He has had 4 units blood transufion.  Appears to have stopped bleeding.  Cecal AVM described on outside colonoscoyp 09/2012, I would like to repeat colonoscopy tomorrow to treat AVMs, these may have caused his bleeding (eloquix of course, contributes).     Rob Buntinganiel Justun Anaya, MD [facility] Pager [phone]

## 2013-12-22 ENCOUNTER — Encounter (HOSPITAL_COMMUNITY)
Admission: EM | Disposition: A | Discharge status: Skilled Nursing Facility | Payer: Self-pay | Source: Home / Self Care | Attending: Pulmonary Disease

## 2013-12-22 ENCOUNTER — Encounter (HOSPITAL_COMMUNITY): Payer: Medicare Other | Admitting: Anesthesiology

## 2013-12-22 ENCOUNTER — Inpatient Hospital Stay (HOSPITAL_COMMUNITY): Payer: Medicare Other | Admitting: Anesthesiology

## 2013-12-22 ENCOUNTER — Encounter (HOSPITAL_COMMUNITY): Payer: Self-pay | Admitting: *Deleted

## 2013-12-22 HISTORY — PX: COLONOSCOPY: SHX5424

## 2013-12-22 HISTORY — PX: GIVENS CAPSULE STUDY: SHX5432

## 2013-12-22 LAB — BASIC METABOLIC PANEL
BUN: 14 mg/dL (ref 6–23)
CO2: 21 meq/L (ref 19–32)
CREATININE: 0.91 mg/dL (ref 0.50–1.35)
Calcium: 8.4 mg/dL (ref 8.4–10.5)
Chloride: 108 mEq/L (ref 96–112)
GFR calc Af Amer: >90 mL/min (ref >90)
GFR calc non Af Amer: 78 mL/min — ABNORMAL LOW (ref >90)
Glucose, Bld: 84 mg/dL (ref 70–99)
Potassium: 3.5 mEq/L — ABNORMAL LOW (ref 3.7–5.3)
Sodium: 143 mEq/L (ref 137–147)

## 2013-12-22 LAB — CBC
HEMATOCRIT: 28.1 % — AB (ref 39.0–52.0)
Hemoglobin: 9.4 g/dL — ABNORMAL LOW (ref 13.0–17.0)
MCH: 31.5 pg (ref 26.0–34.0)
MCHC: 33.5 g/dL (ref 30.0–36.0)
MCV: 94.3 fL (ref 78.0–100.0)
Platelets: 136 10*3/uL — ABNORMAL LOW (ref 150–400)
RBC: 2.98 MIL/uL — AB (ref 4.22–5.81)
RDW: 15.9 % — AB (ref 11.5–15.5)
WBC: 6 10*3/uL (ref 4.0–10.5)

## 2013-12-22 LAB — GLUCOSE, CAPILLARY: Glucose-Capillary: 95 mg/dL (ref 70–99)

## 2013-12-22 SURGERY — COLONOSCOPY
Anesthesia: General

## 2013-12-22 SURGERY — IMAGING PROCEDURE, GI TRACT, INTRALUMINAL, VIA CAPSULE
Anesthesia: LOCAL

## 2013-12-22 MED ORDER — ONDANSETRON HCL 4 MG/2ML IJ SOLN: 4.0000 mg | Freq: Once | INTRAMUSCULAR | Status: AC | PRN

## 2013-12-22 MED ORDER — POTASSIUM CHLORIDE 10 MEQ/100ML IV SOLN
10.0000 meq | INTRAVENOUS | Status: AC
  Administered 2013-12-22 (×2): 10 meq via INTRAVENOUS
  Filled 2013-12-22 (×2): qty 100

## 2013-12-22 MED ORDER — PROPOFOL INFUSION 10 MG/ML OPTIME
INTRAVENOUS | Status: DC | PRN
  Administered 2013-12-22: 50 ug/kg/min via INTRAVENOUS

## 2013-12-22 MED ORDER — PROPOFOL 10 MG/ML IV BOLUS
INTRAVENOUS | Status: DC | PRN
  Administered 2013-12-22 (×2): 20 mg via INTRAVENOUS

## 2013-12-22 MED ORDER — FENTANYL CITRATE 0.05 MG/ML IJ SOLN: 25.0000 ug | INTRAMUSCULAR | Status: DC | PRN

## 2013-12-22 MED ORDER — SODIUM CHLORIDE 0.9 % IV SOLN
INTRAVENOUS | Status: DC
  Administered 2013-12-22: 250 mL via INTRAVENOUS

## 2013-12-22 MED ORDER — POTASSIUM CHLORIDE 10 MEQ/100ML IV SOLN
10.0000 meq | INTRAVENOUS | Status: AC
  Administered 2013-12-22: 10 meq via INTRAVENOUS
  Filled 2013-12-22 (×3): qty 100

## 2013-12-22 MED ORDER — APIXABAN 5 MG PO TABS
5.0000 mg | ORAL_TABLET | Freq: Two times a day (BID) | ORAL | Status: DC
  Administered 2013-12-22 – 2013-12-23 (×2): 5 mg via ORAL
  Filled 2013-12-22 (×3): qty 1

## 2013-12-22 MED ORDER — SODIUM CHLORIDE 0.9 % IV SOLN
INTRAVENOUS | Status: DC | PRN
  Administered 2013-12-22: 14:00:00 via INTRAVENOUS

## 2013-12-22 SURGICAL SUPPLY — 1 items: TOWEL COTTON PACK 4EA (MISCELLANEOUS) ×4 IMPLANT

## 2013-12-22 NOTE — Anesthesia Preprocedure Evaluation (Addendum)
Anesthesia Evaluation  Patient identified by MRN, date of birth, ID band Patient awake    Reviewed: Allergy & Precautions, H&P , NPO status , Patient's Chart, lab work & pertinent test results  Airway Mallampati: I TM Distance: >3 FB Neck ROM: Full    Dental  (+) Upper Dentures, Lower Dentures, Dental Advisory Given   Pulmonary  breath sounds clear to auscultation        Cardiovascular hypertension, Pt. on medications + dysrhythmias Atrial Fibrillation Rhythm:Regular     Neuro/Psych    GI/Hepatic   Endo/Other    Renal/GU      Musculoskeletal   Abdominal   Peds  Hematology   Anesthesia Other Findings Hx of CABG in past. No angina or SOB now.   Reproductive/Obstetrics                         Anesthesia Physical Anesthesia Plan  ASA: III  Anesthesia Plan: General   Post-op Pain Management:    Induction: Intravenous  Airway Management Planned: LMA  Additional Equipment:   Intra-op Plan:   Post-operative Plan: Extubation in OR  Informed Consent:   Dental advisory given  Plan Discussed with: CRNA, Anesthesiologist and Surgeon  Anesthesia Plan Comments:         Anesthesia Quick Evaluation

## 2013-12-22 NOTE — Progress Notes (Signed)
          Daily Rounding Note  12/22/2013, 8:55 AM  LOS: 3 days   SUBJECTIVE:       Completed prep, no problems except the taste.  Up with assistance to bathroom, sink.  Not dizzy, feels well  OBJECTIVE:         Vital signs in last 24 hours:    Temp:  [98 F (36.7 C)-100.1 F (37.8 C)] 98.5 F (36.9 C) (03/31 0625) Pulse Rate:  [66-82] 66 (03/31 0625) Resp:  [17-24] 20 (03/31 0625) BP: (135-178)/(57-77) 152/70 mmHg (03/31 0625) SpO2:  [92 %-97 %] 92 % (03/31 0625) Weight:  [69.174 kg (152 lb 8 oz)] 69.174 kg (152 lb 8 oz) (03/31 0609) Last BM Date: 12/21/13 General: looks better, comfortable and not ill appearig   Heart: RRR Chest: clear bil.  Abdomen: soft, NT, ND, active BS  Extremities: no CCE Neuro/Psych:  Oriented x 3.  No tremor, no limb weakness.  In good spirits.   Intake/Output from previous day: 03/30 0701 - 03/31 0700 In: 960 [P.O.:960] Out: 1350 [Urine:1350]  Intake/Output this shift: Total I/O In: -  Out: 100 [Urine:100]  Lab Results:  Recent Labs  12/20/13 0730 12/21/13 0245 12/22/13 0600  WBC 7.0 8.1 6.0  HGB 8.4* 9.1* 9.4*  HCT 24.5* 26.9* 28.1*  PLT 107* 113* 136*   BMET  Recent Labs  12/20/13 0730 12/21/13 0813 12/22/13 0600  NA 141 141 143  K 3.6* 3.8 3.5*  CL 109 109 108  CO2 19 22 21   GLUCOSE 89 97 84  BUN 35* 20 14  CREATININE 1.03 0.92 0.91  CALCIUM 8.0* 8.2* 8.4   LFT  Recent Labs  12/19/13 1401 12/20/13 0025  PROT 5.4* 4.7*  ALBUMIN 2.9* 2.4*  AST 17 23  ALT 15 21  ALKPHOS 59 65  BILITOT 0.4 0.6   PT/INR  Recent Labs  12/19/13 1401  LABPROT 17.8*  INR 1.51*   Hepatitis Panel No results found for this basename: HEPBSAG, HCVAB, HEPAIGM, HEPBIGM,  in the last 72 hours  Studies/Results: No results found.  ASSESMENT:   * GI bleed with Heme + stool in setting of chronic Apixaban.  EGD 3/29: normal. On daily Omeprazole 40 mg at home. Currently on daily  oral Protonix.  Colonoscopy 09/2012: Cecal telangectasia and int rrhoids.  * ABL anemia. Normocytic. S/p PRBC x 4. Hgb improved and stable. On po iron supplement at admission for hx IDA anemia.  * Atrial Fib. Chronic anticoagulation. Abixaban on hold.      PLAN   *  Colonoscopy today. No problems with Prep    Kristopher [name]  12/22/2013, 8:55 AM Pager: [phone]

## 2013-12-22 NOTE — Op Note (Signed)
[facility] [address] [place], [id number]   COLONOSCOPY PROCEDURE REPORT  PATIENT: Kristopher CitizenBecton, Elonzo  MR#: [id number] BIRTHDATE: 04-28-1932 , 81  yrs. old GENDER: Male ENDOSCOPIST: Rachael Feeaniel P Jacobs, MD PROCEDURE DATE:  12/22/2013 PROCEDURE:   Colonoscopy, diagnostic First Screening Colonoscopy - Avg.  risk and is 50 yrs.  old or older - No.  Prior Negative Screening - Now for repeat screening. N/A  History of Adenoma - Now for follow-up colonoscopy & has been > or = to 3 yrs.  N/A  Polyps Removed Today? No.  Recommend repeat exam, <10 yrs? No. ASA CLASS:   Class III INDICATIONS:overt GI bleeding, anemia; EGD Dr.  Leone PayorGessner 2 days ago was normal; [facility] report stated AVM in cecum; on eloquis prior to admission. MEDICATIONS: MAC sedation, administered by CRNA  DESCRIPTION OF PROCEDURE:   After the risks benefits and alternatives of the procedure were thoroughly explained, informed consent was obtained.  A digital rectal exam revealed no abnormalities of the rectum.   The Pentax Ped Colon [id number] endoscope was introduced through the anus and advanced to the cecum, which was identified by both the appendix and ileocecal valve. No adverse events experienced.   The quality of the prep was good.  The instrument was then slowly withdrawn as the colon was fully examined.   COLON FINDINGS: There were small internal hemorhroids.  The examination was otherwise normal.  Thoroughly examined cecum shows no AVMs.  Retroflexed views revealed no abnormalities. The time to cecum=3 minutes 00 seconds.  Withdrawal time=8 minutes 00 seconds. The scope was withdrawn and the procedure completed. COMPLICATIONS: There were no complications.  ENDOSCOPIC IMPRESSION: There were small internal hemorhroids.  The examination was otherwise normal.  Thoroughly examined cecum shows no AVMs.  RECOMMENDATIONS: Will restart eloquis today.  Will set up small bowel  capsule study to be done tomorrow.   eSigned:  Rachael Feeaniel P Jacobs, MD 12/22/2013 2:29 PM

## 2013-12-22 NOTE — Interval H&P Note (Signed)
History and Physical Interval Note:  12/22/2013 1:41 PM  Kristopher [name]  has presented today for surgery, with the diagnosis of anemia.  The various methods of treatment have been discussed with the patient and family. After consideration of risks, benefits and other options for treatment, the patient has consented to  Procedure(s): COLONOSCOPY (N/A) as a surgical intervention .  The patient's history has been reviewed, patient examined, no change in status, stable for surgery.  I have reviewed the patient's chart and labs.  Questions were answered to the patient's satisfaction.     Rachael FeeJacobs, Daniel P
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
## 2013-12-22 NOTE — Preoperative (Signed)
Beta Blockers   Reason not to administer Beta Blockers:Not Applicable 

## 2013-12-22 NOTE — H&P (View-Only) (Signed)
Daily Rounding Note  12/21/2013, 10:50 AM  LOS: 2 days   SUBJECTIVE:       BM last night was dark.  4 today have been loose and brown.  No blood.  Tolerating solids.  No abdominal pain or n/V.  Sinus rhythm since arrival to 2300.   OBJECTIVE:         Vital signs in last 24 hours:    Temp:  [97.6 F (36.4 C)-98.7 F (37.1 C)] 97.9 F (36.6 C) (03/30 0721) Pulse Rate:  [54-87] 80 (03/30 1000) Resp:  [13-24] 24 (03/30 1000) BP: (102-154)/(44-89) 136/77 mmHg (03/30 1000) SpO2:  [90 %-100 %] 97 % (03/30 1000) Weight:  [75.2 kg (165 lb 12.6 oz)] 75.2 kg (165 lb 12.6 oz) (03/30 0500) Last BM Date: 12/20/13 General: looks pale and weak.  comfortable   Heart: RRR Chest: clear bil.  No dyspnea Abdomen: soft, NT, ND.  Active BS  Extremities: no CCE Neuro/Psych:  Pleasant, oriented x 3.  No limb weakness.   Intake/Output from previous day: 03/29 0701 - 03/30 0700 In: 775 [P.O.:240; I.V.:135; IV Piggyback:400] Out: 2500 [Urine:2500]  Intake/Output this shift: Total I/O In: -  Out: 200 [Urine:200]  Lab Results:  Recent Labs  12/19/13 1401 12/20/13 0025 12/20/13 0730 12/21/13 0245  WBC 8.8  --  7.0 8.1  HGB 4.7* 6.1* 8.4* 9.1*  HCT 13.9* 17.6* 24.5* 26.9*  PLT 146*  --  107* 113*   BMET  Recent Labs  12/20/13 0025 12/20/13 0730 12/21/13 0813  NA 141 141 141  K 3.7 3.6* 3.8  CL 110 109 109  CO2 21 19 22   GLUCOSE 106* 89 97  BUN 41* 35* 20  CREATININE 1.00 1.03 0.92  CALCIUM 7.7* 8.0* 8.2*   LFT  Recent Labs  12/19/13 1401 12/20/13 0025  PROT 5.4* 4.7*  ALBUMIN 2.9* 2.4*  AST 17 23  ALT 15 21  ALKPHOS 59 65  BILITOT 0.4 0.6   PT/INR  Recent Labs  12/19/13 1401  LABPROT 17.8*  INR 1.51*   Hepatitis Panel No results found for this basename: HEPBSAG, HCVAB, HEPAIGM, HEPBIGM,  in the last 72 hours  Studies/Results: Dg Pelvis 1-2 Views 12/19/2013   FINDINGS: Marked superior left hip  joint space narrowing and lateral subluxation of the femoral head with associated subarticular sclerosis and cystic changes on both sides of the joint space. No fracture or dislocation is seen. Diffuse osteopenia. Lower lumbar spine degenerative changes and mild scoliosis.  IMPRESSION: 1. No fracture or dislocation. 2. Marked chronic left hip degenerative changes and subluxation.   Electronically Signed   By: Gordan Payment M.D.   On: 12/19/2013 14:35   Dg Chest Portable 1 View 12/19/2013   IMPRESSION: Borderline cardiomegaly and mild chronic bronchitic changes. No acute abnormality.   Electronically Signed   By: Gordan Payment M.D.   On: 12/19/2013 14:32   Scheduled Meds: . pantoprazole  40 mg Oral QAC breakfast   Continuous Infusions:  PRN Meds:.acetaminophen, diphenhydrAMINE  GI records for [facility] 09/2012.   Workup for iron def anemia. Required transfusion and switched from Coumadin to Eliquis then.   Colonoscopy 10/20/2012: by Dr Demetrius Charity Anne Shutter.  Cecal telangectasia, internal hemorrhoids. No polyps. 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    ASSESMENT:   * GI bleed with Heme + stool in setting of chronic Apixaban.  EGD 3/29: normal. On daily  Omeprazole 40 mg at home. Currently on daily oral Protonix.  Colonoscopy 09/2012:  Cecal telangectasia and int rrhoids.   *  ABL anemia. Normocytic.  S/p PRBC x 4. Hgb improved and stable.  On po iron supplement at admission for hx IDA anemia.    *  Atrial Fib.  Chronic anticoagulation.  Abixaban on hold.      PLAN   *  Next step for eval of anemia may be capsule endo an/or repeat colonoscopy with goal of ablating cecal AVMs?      Jennye MoccasinSarah Gribbin  12/21/2013, 10:50 AM Pager: [phone]  ________________________________________________________________________  [facility] MD note:  I personally examined the patient, reviewed the data and agree with the assessment and plan described  above.  He has had 4 units blood transufion.  Appears to have stopped bleeding.  Cecal AVM described on outside colonoscoyp 09/2012, I would like to repeat colonoscopy tomorrow to treat AVMs, these may have caused his bleeding (eloquix of course, contributes).     Rob Buntinganiel Jacobs, MD [facility] Pager [phone]

## 2013-12-22 NOTE — Transfer of Care (Signed)
Immediate Anesthesia Transfer of Care Note  Patient: Kristopher [name]  Procedure(s) Performed: Procedure(s): COLONOSCOPY (N/A)  Patient Location: PACU and Endoscopy Unit  Anesthesia Type:MAC  Level of Consciousness: lethargic and responds to stimulation  Airway & Oxygen Therapy: Patient Spontanous Breathing and Patient connected to nasal cannula oxygen  Post-op Assessment: Report given to PACU RN and Post -op Vital signs reviewed and stable  Post vital signs: Reviewed and stable  Complications: No apparent anesthesia complications

## 2013-12-22 NOTE — Anesthesia Postprocedure Evaluation (Signed)
  Anesthesia Post-op Note  Patient: Kristopher [name]  Procedure(s) Performed: Procedure(s): COLONOSCOPY (N/A)  Patient Location: PACU  Anesthesia Type:MAC  Level of Consciousness: awake  Airway and Oxygen Therapy: Patient Spontanous Breathing  Post-op Pain: mild  Post-op Assessment: Post-op Vital signs reviewed  Post-op Vital Signs: Reviewed  Complications: No apparent anesthesia complications

## 2013-12-23 ENCOUNTER — Encounter (HOSPITAL_COMMUNITY): Payer: Self-pay | Admitting: Gastroenterology

## 2013-12-23 ENCOUNTER — Telehealth: Payer: Self-pay

## 2013-12-23 DIAGNOSIS — D649 Anemia, unspecified: Secondary | ICD-10-CM

## 2013-12-23 LAB — CBC
HEMATOCRIT: 26.4 % — AB (ref 39.0–52.0)
Hemoglobin: 8.9 g/dL — ABNORMAL LOW (ref 13.0–17.0)
MCH: 31.4 pg (ref 26.0–34.0)
MCHC: 33.7 g/dL (ref 30.0–36.0)
MCV: 93.3 fL (ref 78.0–100.0)
Platelets: 140 10*3/uL — ABNORMAL LOW (ref 150–400)
RBC: 2.83 MIL/uL — ABNORMAL LOW (ref 4.22–5.81)
RDW: 15.4 % (ref 11.5–15.5)
WBC: 6 10*3/uL (ref 4.0–10.5)

## 2013-12-23 LAB — TYPE AND SCREEN
ABO/RH(D): O POS
ANTIBODY SCREEN: NEGATIVE
UNIT DIVISION: 0
UNIT DIVISION: 0
UNIT DIVISION: 0
Unit division: 0
Unit division: 0

## 2013-12-23 LAB — BASIC METABOLIC PANEL
BUN: 13 mg/dL (ref 6–23)
CALCIUM: 7.8 mg/dL — AB (ref 8.4–10.5)
CO2: 23 mEq/L (ref 19–32)
Chloride: 104 mEq/L (ref 96–112)
Creatinine, Ser: 1.02 mg/dL (ref 0.50–1.35)
GFR calc Af Amer: 79 mL/min — ABNORMAL LOW (ref >90)
GFR calc non Af Amer: 68 mL/min — ABNORMAL LOW (ref >90)
GLUCOSE: 91 mg/dL (ref 70–99)
Potassium: 3.9 mEq/L (ref 3.7–5.3)
Sodium: 139 mEq/L (ref 137–147)

## 2013-12-23 NOTE — Progress Notes (Addendum)
Kristopher [name]    Since last GI [name]: Colonoscopy yesterday, no AVMs in cecum as was described on colonoscopy report 2014.  No blood in colon. He consented for SB Capsule study afterwards and that was done.  Has had no overt GI bleeding since admission,   Objective: Vital signs in last 24 hours: Temp:  [98 F (36.7 C)-98.5 F (36.9 C)] 98.2 F (36.8 C) (04/01 0620) Pulse Rate:  [65-80] 69 (04/01 0620) Resp:  [15-23] 20 (04/01 0620) BP: (108-171)/(45-91) 160/69 mmHg (04/01 0620) SpO2:  [91 %-100 %] 95 % (04/01 0620) Weight:  [147 lb 3.2 oz (66.769 kg)] 147 lb 3.2 oz (66.769 kg) (04/01 0703) Last BM Date: 12/22/13 General: alert and oriented times 3 Heart: regular rate and rythm Abdomen: soft, non-tender, non-distended, normal bowel sounds   Lab Results:  Recent Labs  12/21/13 0245 12/22/13 0600 12/23/13 0525  WBC 8.1 6.0 6.0  HGB 9.1* 9.4* 8.9*  PLT 113* 136* 140*  MCV 93.7 94.3 93.3    Recent Labs  12/21/13 0813 12/22/13 0600 12/23/13 0525  NA 141 143 139  K 3.8 3.5* 3.9  CL 109 108 104  CO2 22 21 23   GLUCOSE 97 84 91  BUN 20 14 13   CREATININE 0.92 0.91 1.02  CALCIUM 8.2* 8.4 7.8*   Medications: Scheduled Meds: . alfuzosin  10 mg Oral Q breakfast  . allopurinol  300 mg Oral Daily  . apixaban  5 mg Oral Q12H  . finasteride  5 mg Oral Daily  . montelukast  10 mg Oral Daily  . pantoprazole  40 mg Oral QAC breakfast   Continuous Infusions: . sodium chloride 250 mL (12/22/13 1815)   PRN Meds:.acetaminophen, diphenhydrAMINE, fentaNYL    Assessment/Plan: 80 y.o. male GI bleed; unclear source despite EGD, colonoscopy this admission  He received 4 units PRBC initially and has not required more, Hb stable, HD stable and no recurrent overt bleeding. We restarted his eloquis yesterday.  [facility]should have [facility] report by early afternoon today).  As long as there is no active bleeding noted, he is OK to d/c  home on his usual blood thinner.   Rachael FeeJacobs, Kristopher P, MD  12/23/2013, 7:17 AM [facility] Pager [phone]    SB capsule preliminary reading: "No active bleeding, no blood in lumen... There is an area in MID small bowel with inflammation and small ulcers, ? Ischemia." He restarted eloquis yesterday and has had NO overt rebleeding.  I think it is safe for him to go home today.  He knows to call my ofifce if he has overt bleeding again, dark stools and to also stop eloquis immediately.  IF that occurs, would have to decide about ever restarting the blood thinner vs. Sending for small bowel endoscopic evaluation [facility]  My office will get in touch with him about return visit in 6-7 weeks to see how he is doing, will plan to check cbc the day prior as well.
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
## 2013-12-23 NOTE — Clinical Social Work Placement (Signed)
Clinical Social Work Department CLINICAL SOCIAL WORK PLACEMENT NOTE 12/23/2013  Patient:  Kristopher [name],Esgar  Account Number:  [id number] Admit date:  12/19/2013  Clinical Social Worker:  Cherre BlancJOSEPH BRYANT [name], [place]  Date/time:  12/23/2013 07:44 PM  Clinical Social Work is seeking post-discharge placement for this patient at the following level of care:   SKILLED NURSING   (*CSW will update this form in Epic as items are completed)   12/23/2013  Patient/family provided with [facility] Department of Clinical Social Work's list of facilities offering this level of care within the geographic area requested by the patient (or if unable, by the patient's family).  12/23/2013  Patient/family informed of their freedom to choose among providers that offer the needed level of care, that participate in Medicare, Medicaid or managed care program needed by the patient, have an available bed and are willing to accept the patient.  12/23/2013  Patient/family informed of [facility]' ownership interest in [facility], as well as of the fact that they are under no obligation to receive care at this facility.  PASARR submitted to EDS on 12/23/2013 PASARR number received from EDS on 12/23/2013  FL2 transmitted to all facilities in geographic area requested by pt/family on  12/23/2013 FL2 transmitted to all facilities within larger geographic area on   Patient informed that his/her managed care company has contracts with or will negotiate with  certain facilities, including the following:     Patient/family informed of bed offers received:  12/23/2013 [facility] Physician recommends and patient chooses bed at    [facility]  12/23/2013 Patient to be transferred to facility by Daughter's vehicle  The following physician request were entered in Epic:   Additional Comments: Per MD patient ready to DC  to [facility]. RN, patient, and facility aware of DC. Patient's daughter will transport patient to facility. DC packet given to RN along with number for report. CSW signing off this time.   Roddie McBryant Thaddius Manes MSW, [place], [place], [phone]

## 2013-12-23 NOTE — Telephone Encounter (Signed)
Message copied by Donata DuffLEWIS, Joran Kallal L on Wed Dec 23, 2013  2:32 PM ------      Message from: Rob BuntingJACOBS, DANIEL P      Created: Wed Dec 23, 2013  2:18 PM       He needs rov with me in 6-7 weeks, cbc the day prior thanks            [facility] today or tomorrow likely ------

## 2013-12-23 NOTE — Care Management Note (Signed)
    Page 1 of 1   12/23/2013     3:33:04 PM   CARE MANAGEMENT NOTE 12/23/2013  Patient:  Kristopher [name],Kristopher [name]   Account Number:  [id number]  Date Initiated:  12/23/2013  Documentation initiated by:  Letha CapeAYLOR,Emmakate Hypes  Subjective/Objective Assessment:   dx afib  admit- from [facility].     Action/Plan:   Anticipated DC Date:  12/23/2013   Anticipated DC Plan:  SKILLED NURSING FACILITY  In-house referral  Clinical Social Worker      DC Planning Services  CM consult      Choice offered to / List presented to:             Status of service:  Completed, signed off Medicare Important Message given?   (If response is "NO", the following Medicare IM given date fields will be blank) Date Medicare IM given:   Date Additional Medicare IM given:    Discharge Disposition:  SKILLED NURSING FACILITY  Per UR Regulation:  Reviewed for med. necessity/level of care/duration of stay  If discussed at Long Length of Stay Meetings, dates discussed:    Comments:

## 2013-12-23 NOTE — Telephone Encounter (Signed)
Letter mailed to the pt. 

## 2013-12-23 NOTE — Clinical Social Work Psychosocial (Signed)
Clinical Social Work Department BRIEF PSYCHOSOCIAL ASSESSMENT 12/23/2013  Patient:  Kristopher [name], Kristopher [name]     Account Number:  [id number]     Admit date:  12/19/2013  Clinical Social Worker:  Lovey Newcomer  Date/Time:  12/23/2013 02:00 PM  Referred by:  Physician  Date Referred:  12/23/2013 Referred for  SNF Placement   Other Referral:   Interview type:  Patient Other interview type:   Patient alert and oriented at time of assessment.    PSYCHOSOCIAL DATA Living Status:  ALONE Admitted from facility:   Level of care:  Independent Living Primary support name:  Judeen Hammans and Benjamine Mola Primary support relationship to patient:  CHILD, ADULT Degree of support available:   Support is good.    CURRENT CONCERNS Current Concerns  Post-Acute Placement   Other Concerns:    SOCIAL WORK ASSESSMENT / PLAN CSW received call from [facility] admission coordinator Claiborne Billings stating that patient is supposed to be coming to SNF at [facility]. Claiborne Billings stated that patient's daughter has "set it up."  CSW met with patient at bedside. Patient stated that he was not aware of this and that his daughter had not discussed this with him. CSW explained that patient could return to his independent living with HHPT or could go to SNF for rehab and that the choice is his to make. Patient states that he just moved to [place] [facility] and likes it. Patient stated that he needed some time to think about it and discuss SNF at [facility] with his daughter before making his decision. CSW explained that CSW would go ahead and complete necessary documents and send to [facility] in case he decides to go for short term rehab. Patient was agreeable to this.   Assessment/plan status:  Psychosocial Support/Ongoing Assessment of Needs Other assessment/ plan:   Complete FL2, Fax, PASRR   Information/referral to community resources:   CSW contact information given to patient.    PATIENT'S/FAMILY'S RESPONSE TO  PLAN OF CARE: Patient seems indifferent about going to [facility]. He states, "I'm not opposed to going, I just haven't thought about it." Patient was pleasant, appropriate, and appreciative of CSW contact. CSW will assist with DC to SNF if patient chooses this.          Liz Beach MSW, [place], [place], [phone]

## 2013-12-23 NOTE — Progress Notes (Signed)
Patient was discharged to SNF by MD order; discharged instructions  review and give to patient and his daughter with care notes and prescriptions; IV DIC; skin intact; [facility] facility called and given report;  patient will be escorted to the car by nurse tech via wheelchair.

## 2014-01-10 ENCOUNTER — Emergency Department (HOSPITAL_COMMUNITY): Payer: Medicare Other

## 2014-01-10 ENCOUNTER — Encounter (HOSPITAL_COMMUNITY): Payer: Self-pay | Admitting: Emergency Medicine

## 2014-01-10 ENCOUNTER — Emergency Department (HOSPITAL_COMMUNITY)
Admission: EM | Admit: 2014-01-10 | Discharge: 2014-01-10 | Disposition: A | Discharge status: Home / Self Care | Payer: Medicare Other | Attending: Emergency Medicine | Admitting: Emergency Medicine

## 2014-01-10 DIAGNOSIS — Z8673 Personal history of transient ischemic attack (TIA), and cerebral infarction without residual deficits: Secondary | ICD-10-CM | POA: Insufficient documentation

## 2014-01-10 DIAGNOSIS — Z860100 Personal history of colon polyps, unspecified: Secondary | ICD-10-CM | POA: Insufficient documentation

## 2014-01-10 DIAGNOSIS — N400 Enlarged prostate without lower urinary tract symptoms: Secondary | ICD-10-CM | POA: Insufficient documentation

## 2014-01-10 DIAGNOSIS — Z79899 Other long term (current) drug therapy: Secondary | ICD-10-CM | POA: Insufficient documentation

## 2014-01-10 DIAGNOSIS — I10 Essential (primary) hypertension: Secondary | ICD-10-CM | POA: Insufficient documentation

## 2014-01-10 DIAGNOSIS — K5900 Constipation, unspecified: Secondary | ICD-10-CM

## 2014-01-10 DIAGNOSIS — I4891 Unspecified atrial fibrillation: Secondary | ICD-10-CM | POA: Insufficient documentation

## 2014-01-10 DIAGNOSIS — Z7902 Long term (current) use of antithrombotics/antiplatelets: Secondary | ICD-10-CM | POA: Insufficient documentation

## 2014-01-10 DIAGNOSIS — I2510 Atherosclerotic heart disease of native coronary artery without angina pectoris: Secondary | ICD-10-CM | POA: Insufficient documentation

## 2014-01-10 DIAGNOSIS — M109 Gout, unspecified: Secondary | ICD-10-CM | POA: Insufficient documentation

## 2014-01-10 DIAGNOSIS — Z951 Presence of aortocoronary bypass graft: Secondary | ICD-10-CM | POA: Insufficient documentation

## 2014-01-10 DIAGNOSIS — M5030 Other cervical disc degeneration, unspecified cervical region: Secondary | ICD-10-CM | POA: Insufficient documentation

## 2014-01-10 DIAGNOSIS — R319 Hematuria, unspecified: Secondary | ICD-10-CM | POA: Insufficient documentation

## 2014-01-10 LAB — COMPREHENSIVE METABOLIC PANEL
ALT: 17 U/L (ref 0–53)
AST: 21 U/L (ref 0–37)
Albumin: 3.5 g/dL (ref 3.5–5.2)
Alkaline Phosphatase: 112 U/L (ref 39–117)
BUN: 11 mg/dL (ref 6–23)
CALCIUM: 9.5 mg/dL (ref 8.4–10.5)
CHLORIDE: 101 meq/L (ref 96–112)
CO2: 27 meq/L (ref 19–32)
Creatinine, Ser: 1.04 mg/dL (ref 0.50–1.35)
GFR calc Af Amer: 77 mL/min — ABNORMAL LOW (ref >90)
GFR, EST NON AFRICAN AMERICAN: 66 mL/min — AB (ref >90)
Glucose, Bld: 119 mg/dL — ABNORMAL HIGH (ref 70–99)
Potassium: 4 mEq/L (ref 3.7–5.3)
Sodium: 141 mEq/L (ref 137–147)
Total Bilirubin: 0.7 mg/dL (ref 0.3–1.2)
Total Protein: 7.1 g/dL (ref 6.0–8.3)

## 2014-01-10 LAB — URINALYSIS, ROUTINE W REFLEX MICROSCOPIC
Bilirubin Urine: NEGATIVE
Glucose, UA: NEGATIVE mg/dL
Ketones, ur: NEGATIVE mg/dL
Leukocytes, UA: NEGATIVE
Nitrite: NEGATIVE
PROTEIN: NEGATIVE mg/dL
SPECIFIC GRAVITY, URINE: 1.01 (ref 1.005–1.030)
UROBILINOGEN UA: 0.2 mg/dL (ref 0.0–1.0)
pH: 6 (ref 5.0–8.0)

## 2014-01-10 LAB — CBC WITH DIFFERENTIAL/PLATELET
BASOS PCT: 0 % (ref 0–1)
Basophils Absolute: 0 10*3/uL (ref 0.0–0.1)
EOS PCT: 0 % (ref 0–5)
Eosinophils Absolute: 0 10*3/uL (ref 0.0–0.7)
HEMATOCRIT: 31 % — AB (ref 39.0–52.0)
HEMOGLOBIN: 10 g/dL — AB (ref 13.0–17.0)
Lymphocytes Relative: 11 % — ABNORMAL LOW (ref 12–46)
Lymphs Abs: 0.7 10*3/uL (ref 0.7–4.0)
MCH: 30 pg (ref 26.0–34.0)
MCHC: 32.3 g/dL (ref 30.0–36.0)
MCV: 93.1 fL (ref 78.0–100.0)
MONOS PCT: 5 % (ref 3–12)
Monocytes Absolute: 0.3 10*3/uL (ref 0.1–1.0)
NEUTROS ABS: 5.4 10*3/uL (ref 1.7–7.7)
Neutrophils Relative %: 84 % — ABNORMAL HIGH (ref 43–77)
Platelets: 237 10*3/uL (ref 150–400)
RBC: 3.33 MIL/uL — ABNORMAL LOW (ref 4.22–5.81)
RDW: 15.1 % (ref 11.5–15.5)
WBC: 6.4 10*3/uL (ref 4.0–10.5)

## 2014-01-10 LAB — URINE MICROSCOPIC-ADD ON

## 2014-01-10 LAB — TROPONIN I

## 2014-01-10 MED ORDER — CEPHALEXIN 500 MG PO CAPS: 500.0000 mg | ORAL_CAPSULE | Freq: Four times a day (QID) | ORAL | Status: DC

## 2014-01-10 MED ORDER — IOHEXOL 300 MG/ML  SOLN
50.0000 mL | Freq: Once | INTRAMUSCULAR | Status: AC | PRN
  Administered 2014-01-10: 50 mL via ORAL

## 2014-01-10 MED ORDER — POLYETHYLENE GLYCOL 3350 17 G PO PACK: 17.0000 g | PACK | Freq: Every day | ORAL | Status: DC | PRN

## 2014-01-10 MED ORDER — IOHEXOL 300 MG/ML  SOLN
100.0000 mL | Freq: Once | INTRAMUSCULAR | Status: AC | PRN
  Administered 2014-01-10: 100 mL via INTRAVENOUS

## 2014-01-10 NOTE — Discharge Instructions (Signed)
1. Medications: keflex is your antibiotic, Miralax for constipation, usual home medications 2. Treatment: rest, drink plenty of fluids,  3. Follow Up: Please followup with Dr. Pete GlatterStoneking tomorrow for discussion of your diagnoses and further evaluation after today's visit; and the urologist for further evaluation of your prostate  Constipation, Adult Constipation is when a person has fewer than 3 bowel movements a week; has difficulty having a bowel movement; or has stools that are dry, hard, or larger than normal. As people grow older, constipation is more common. If you try to fix constipation with medicines that make you have a bowel movement (laxatives), the problem may get worse. Long-term laxative use may cause the muscles of the colon to become weak. A low-fiber diet, not taking in enough fluids, and taking certain medicines may make constipation worse. CAUSES   Certain medicines, such as antidepressants, pain medicine, iron supplements, antacids, and water pills.   Certain diseases, such as diabetes, irritable bowel syndrome (IBS), thyroid disease, or depression.   Not drinking enough water.   Not eating enough fiber-rich foods.   Stress or travel.  Lack of physical activity or exercise.  Not going to the restroom when there is the urge to have a bowel movement.  Ignoring the urge to have a bowel movement.  Using laxatives too much. SYMPTOMS   Having fewer than 3 bowel movements a week.   Straining to have a bowel movement.   Having hard, dry, or larger than normal stools.   Feeling full or bloated.   Pain in the lower abdomen.  Not feeling relief after having a bowel movement. DIAGNOSIS  Your caregiver will take a medical history and perform a physical exam. Further testing may be done for severe constipation. Some tests may include:   A barium enema X-ray to examine your rectum, colon, and sometimes, your small intestine.  A sigmoidoscopy to examine your  lower colon.  A colonoscopy to examine your entire colon. TREATMENT  Treatment will depend on the severity of your constipation and what is causing it. Some dietary treatments include drinking more fluids and eating more fiber-rich foods. Lifestyle treatments may include regular exercise. If these diet and lifestyle recommendations do not help, your caregiver may recommend taking over-the-counter laxative medicines to help you have bowel movements. Prescription medicines may be prescribed if over-the-counter medicines do not work.  HOME CARE INSTRUCTIONS   Increase dietary fiber in your diet, such as fruits, vegetables, whole grains, and beans. Limit high-fat and processed sugars in your diet, such as Jamaica[place] fries, hamburgers, cookies, candies, and soda.   A fiber supplement may be added to your diet if you cannot get enough fiber from foods.   Drink enough fluids to keep your urine clear or pale yellow.   Exercise regularly or as directed by your caregiver.   Go to the restroom when you have the urge to go. Do not hold it.  Only take medicines as directed by your caregiver. Do not take other medicines for constipation without talking to your caregiver first. SEEK IMMEDIATE MEDICAL CARE IF:   You have bright red blood in your stool.   Your constipation lasts for more than 4 days or gets worse.   You have abdominal or rectal pain.   You have thin, pencil-like stools.  You have unexplained weight loss. MAKE SURE YOU:   Understand these instructions.  Will watch your condition.  Will get help right away if you are not doing well or get worse. Document  Released: 06/08/2004 Document Revised: 12/03/2011 Document Reviewed: 06/22/2013 [facility] Patient Information 2014 [place], [place].

## 2014-01-10 NOTE — ED Provider Notes (Signed)
CSN: [id number]     Arrival date & time 01/10/14  1611 History   First MD Initiated Contact with Patient 01/10/14 1614     Chief Complaint  Patient presents with  . Abdominal Pain     (Consider location/radiation/quality/duration/timing/severity/associated sxs/prior Treatment) Patient is a 80 y.o. male presenting with abdominal pain. The history is provided by the patient, medical records and a relative. No language interpreter was used.  Abdominal Pain Associated symptoms: nausea and vomiting   Associated symptoms: no chest pain, no constipation, no cough, no diarrhea, no dysuria, no fatigue, no fever, no hematuria and no shortness of breath     Eulah Citizendwin Cuny is a [age] y.o. male  with a hx of afib on eliquis, CAD s/p CABG, HTN presents to the Emergency Department complaining of gradual, persistent, progressively worsening mid abdominal pain with associated nausea and vomiting x1 onset this AM.  He reports the symptoms began with the urge to defecate, but he was only able to have a very small BM.  He denies melena or hematochezia with the BM.  He reports he drank a glass of ice water this afternoon which caused the episode of vomiting, but has otherwise had no associated symptoms.  Pt reports Dr. Pete GlatterStoneking recommended that he come to the ED for evaluation.  Pt denies taking OTC medications for the pain.  He reports he does take iron supplements for many years that often cause constipation and sometimes diarrhea, but never abd pain.  He reports Hx of appendectomy, but no other abd surgeries.  Pt denies fever, chills, headache, neck pain, chest pain, SOB, diarrhea, weakness, dizziness, syncope, dysuria, hematuria.    Pt's daughter in the room reports that he was admitted to the hospital 2 weeks ago with severe anemia requiring a blood transfusion.  She reports he subsequently underwent, EGD, colonoscopy and camera evaluation of the bowels without evidence of a GI bleed.  He did not receive abx during  thsi visit and has been well at home ever since.      Past Medical History  Diagnosis Date  . Atrial fibrillation   . Hx of CABG   . Hypertension   . DJD (degenerative joint disease), cervical   . Colon polyps   . Gout   . BPH (benign prostatic hyperplasia)   . Stroke     "light stroke" per daughter   Past Surgical History  Procedure Laterality Date  . Coronary artery bypass graft    . Appendectomy    . Tonsillectomy    . Colonoscopy    . Upper gastrointestinal endoscopy    . Esophagogastroduodenoscopy N/A 12/20/2013    Procedure: ESOPHAGOGASTRODUODENOSCOPY (EGD);  Surgeon: Iva Booparl E Gessner, MD;  [facility];  Service: Endoscopy;  Laterality: N/A;  might be bedside  . Colonoscopy N/A 12/22/2013    Procedure: COLONOSCOPY;  Surgeon: Rachael Feeaniel P Jacobs, MD;  [facility];  Service: Endoscopy;  Laterality: N/A;  . Givens capsule study N/A 12/22/2013    Procedure: GIVENS CAPSULE STUDY;  Surgeon: Rachael Feeaniel P Jacobs, MD;  [facility];  Service: Endoscopy;  Laterality: N/A;   No family history on file. History  Substance Use Topics  . Smoking status: Never Smoker   . Smokeless tobacco: Not on file  . Alcohol Use: No    Review of Systems  Constitutional: Negative for fever, diaphoresis, appetite change, fatigue and unexpected weight change.  HENT: Negative for mouth sores and trouble swallowing.   Respiratory: Negative for cough, chest tightness,  shortness of breath, wheezing and stridor.   Cardiovascular: Negative for chest pain and palpitations.  Gastrointestinal: Positive for nausea, vomiting and abdominal pain. Negative for diarrhea, constipation, blood in stool, abdominal distention and rectal pain.  Genitourinary: Negative for dysuria, urgency, frequency, hematuria, flank pain and difficulty urinating.  Musculoskeletal: Negative for back pain, neck pain and neck stiffness.  Skin: Negative for rash.  Neurological: Negative for weakness.  Hematological:  Negative for adenopathy.  Psychiatric/Behavioral: Negative for confusion.  All other systems reviewed and are negative.     Allergies  Codeine and Sulfa antibiotics  Home Medications   Prior to Admission medications   Medication Sig Start Date End Date Taking? Authorizing Provider  acetaminophen (TYLENOL) 650 MG CR tablet Take 1,300 mg by mouth 2 (two) times daily.    Historical Provider, MD  alfuzosin (UROXATRAL) 10 MG 24 hr tablet Take 10 mg by mouth daily with breakfast.    Historical Provider, MD  allopurinol (ZYLOPRIM) 300 MG tablet Take 300 mg by mouth daily.    Historical Provider, MD  apixaban (ELIQUIS) 5 MG TABS tablet Take 5 mg by mouth 2 (two) times daily.    Historical Provider, MD  diltiazem (CARDIZEM CD) 240 MG 24 hr capsule Take 240 mg by mouth daily.    Historical Provider, MD  finasteride (PROSCAR) 5 MG tablet Take 5 mg by mouth daily. 10/15/13   Historical Provider, MD  Iron-FA-B Cmp-C-Biot-Probiotic (FUSION PLUS) CAPS Take 1 capsule by mouth daily.    Historical Provider, MD  montelukast (SINGULAIR) 10 MG tablet Take 10 mg by mouth daily. 09/29/13   Historical Provider, MD  Multiple Vitamin (MULTIVITAMIN WITH MINERALS) TABS tablet Take 1 tablet by mouth daily.    Historical Provider, MD  niacin (NIASPAN) 750 MG CR tablet Take 1,500 mg by mouth at bedtime.    Historical Provider, MD  omeprazole (PRILOSEC) 40 MG capsule Take 40 mg by mouth daily.    Historical Provider, MD  ramipril (ALTACE) 5 MG capsule Take 5 mg by mouth daily. 10/30/13   Historical Provider, MD   BP 147/7  Pulse 69  Temp(Src) 98.2 F (36.8 C) (Oral)  Resp 20  SpO2 95% Physical Exam  Nursing note and vitals reviewed. Constitutional: He is oriented to person, place, and time. He appears well-developed and well-nourished.  HENT:  Head: Normocephalic and atraumatic.  Mouth/Throat: Oropharynx is clear and moist.  Eyes: Conjunctivae are normal. No scleral icterus.  Cardiovascular: Normal rate, regular  rhythm and intact distal pulses.   Pulmonary/Chest: Effort normal and breath sounds normal. No respiratory distress. He has no wheezes.  Abdominal: Soft. Normal appearance and bowel sounds are normal. He exhibits no distension and no mass. There is tenderness in the suprapubic area and left lower quadrant. There is no rebound, no guarding and no CVA tenderness.  LLQ and suprapubic abd discomfort without guarding or rebound NO CVA tenderness NO abd distension or evidence of ascites  Neurological: He is alert and oriented to person, place, and time. He exhibits normal muscle tone. Coordination normal.  Skin: Skin is warm and dry.  Psychiatric: He has a normal mood and affect.    ED Course  Procedures (including critical care time) Labs Review Labs Reviewed  CBC WITH DIFFERENTIAL - Abnormal; Notable for the following:    RBC 3.33 (*)    Hemoglobin 10.0 (*)    HCT 31.0 (*)    Neutrophils Relative % 84 (*)    Lymphocytes Relative 11 (*)    All  other components within normal limits  COMPREHENSIVE METABOLIC PANEL - Abnormal; Notable for the following:    Glucose, Bld 119 (*)    GFR calc non Af Amer 66 (*)    GFR calc Af Amer 77 (*)    All other components within normal limits  URINALYSIS, ROUTINE W REFLEX MICROSCOPIC - Abnormal; Notable for the following:    Hgb urine dipstick LARGE (*)    All other components within normal limits  URINE CULTURE  TROPONIN I  URINE MICROSCOPIC-ADD ON    Imaging Review Ct Abdomen Pelvis W Contrast  01/10/2014   CLINICAL DATA:  Lower abdominal pain  EXAM: CT ABDOMEN AND PELVIS WITH CONTRAST  TECHNIQUE: Multidetector CT imaging of the abdomen and pelvis was performed using the standard protocol following bolus administration of intravenous contrast. Oral contrast was also administered.  CONTRAST:  OMNIPAQUE IOHEXOL 300 MG/ML  SOLN  COMPARISON:  None.  FINDINGS: There is scarring in the lung bases. There is extensive coronary artery calcification.  There is mild left base atelectasis.  No focal liver lesions are identified. There is no biliary duct dilatation. Gallbladder wall does not appear thickened.  Spleen, pancreas, and adrenals appear normal.  There is a 2 mm nonobstructing calculus in the upper pole the left kidney. A second 2 mm calculus is noted on the right in the upper to midpole region. A 2 mm calculus is noted in the midpole region more laterally. There is a 4 mm calculus in the lower pole the right kidney. In the left kidney, there is a 3 mm calculus with a nearby 7 mm calculus in the upper pole region. There are several tiny calculi in the mid kidney. There is a 1 mm calculus in the mid to lower pole region as well as two, 2 mm calculi in the lower pole region. There is no renal mass or hydronephrosis on either side. There is no appreciable ureteral calculus or ureterectasis on either side.  In the pelvis, the prostate is enlarged and irregular, impressing on the inferior aspect of the urinary bladder. The urinary bladder wall is mildly thickened. There is no pelvic mass outside of the prostate. There is no pelvic fluid collection. There are sigmoid diverticula without diverticulitis.  Appendix is absent.  There is dilatation of the at ascending and transverse colon regions without a well-defined focus of obstruction. Suspect a degree of colonic ileus. There is no small bowel obstruction. No free air or portal venous air.  There is no ascites, adenopathy, or abscess in the abdomen or pelvis. The aorta is tortuous. There is mild aneurysmal dilatation of the distal abdominal aorta with a maximum transverse diameter of 3.0 x 3.1 cm. There is no periaortic fluid.  There is extensive lumbar arthropathy. There is advanced osteoarthritis in the left hip joint region. There are no well-defined blastic or lytic bone lesions.  IMPRESSION: The prostate gland is enlarged and irregular in contour with impression on the urinary bladder inferiorly. The  appearance of the prostate raises concern for prostate neoplasia. Appropriate clinical and laboratory evaluation advised.  There may be mild cystitis.  No appreciable adenopathy.  Small distal abdominal aortic aneurysm.  Evidence of a degree of colonic ileus. No bowel obstruction. No abscess. No mesenteric inflammation.  Advanced osteoarthritis in left hip joint. Moderate generalized osteoarthritis in the lumbar spine. Bones are somewhat osteoporotic, particularly in the periarticular regions.  Multiple nonobstructing calculi in each kidney. No hydronephrosis. No ureteral calculus.  Extensive atherosclerotic change with  extensive coronary artery calcification noted.   Electronically Signed   By: Bretta BangWilliam  Woodruff M.D.   On: 01/10/2014 18:46     EKG Interpretation None      ECG:  Date: 01/10/2014  Rate: 70  Rhythm: normal sinus rhythm  QRS Axis: normal  Intervals: normal  ST/T Wave abnormalities: normal  Conduction Disutrbances:none  Narrative Interpretation: Patient to normal sinus rhythm today, nonischemic, unchanged from previous Old EKG Reviewed: unchanged    MDM   Final diagnoses:  Hematuria  Enlarged prostate  Constipation   Eulah CitizenEdwin Wartman presents with LLQ abd pain, 1 episode of nausea and vomiting and c/o constipation this afternoon.  Pt with soft and nondistended abd.  NO rigidity or guarding. Pt refusing pain medication at this time.  Pt denies CP, SOB, diaphoresis.  Will obtain labs, ECG and imaging.    8:12 PM Patient has remained alert, oriented, nontoxic and nonseptic appearing. Emergency department. He has not required any pain medicine for his abdominal pain.  Urinalysis with large amount hemoglobin but no other evidence of urinary tract infection. CBC with anemia at 10.0 up from previous in the 8 range. CMP reassuring.  Troponin negative and ECG nonischemic without evidence of A. fib today.  CT scan with enlarged and irregular prostate gland as well as possible cystitis.  Patient also with dilation of the ascending and transverse colon without evidence of obstruction. Possible small non-of ileus.  Discussed all these findings with the patient and family. We will begin patient on an antibiotic for urinary tract infection and send urine culture. Recommend close followup by primary care with reevaluation tomorrow. Also recommend close followup with urology for patient's prostate findings.  Patient is to return to the emergency department for intractable vomiting, high fevers or increasing abdominal pain.  He is tolerating by mouth here in the Department without emesis or difficulty.  It has been determined that no acute conditions requiring further emergency intervention are present at this time. The patient/guardian have been advised of the diagnosis and plan. We have discussed signs and symptoms that warrant return to the ED, such as changes or worsening in symptoms.   Vital signs are stable at discharge.   BP 147/7  Pulse 69  Temp(Src) 98.2 F (36.8 C) (Oral)  Resp 20  SpO2 95%  Patient/guardian has voiced understanding and agreed to follow-up with the PCP or specialist.  The patient was discussed with and seen by Dr. Deretha EmoryZackowski who agrees with the treatment plan.       Dahlia ClientHannah Kahari Critzer, PA-C 01/10/14 2016

## 2014-01-10 NOTE — ED Notes (Signed)
Pt sent by Dr. Pete GlatterStoneking.  Pt c/o mid abd pain, vomiting since this morning.  No diarrhea.

## 2014-01-10 NOTE — ED Provider Notes (Signed)
Medical screening examination/treatment/procedure(s) were conducted as a shared visit with non-physician practitioner(s) and myself.  I personally evaluated the patient during the encounter.   EKG Interpretation None      Results for orders placed during the hospital encounter of 01/10/14  CBC WITH DIFFERENTIAL      Result Value Ref Range   WBC 6.4  4.0 - 10.5 K/uL   RBC 3.33 (*) 4.22 - 5.81 MIL/uL   Hemoglobin 10.0 (*) 13.0 - 17.0 g/dL   HCT 16.1 (*) 09.6 - 04.5 %   MCV 93.1  78.0 - 100.0 fL   MCH 30.0  26.0 - 34.0 pg   MCHC 32.3  30.0 - 36.0 g/dL   RDW 40.9  81.1 - 91.4 %   Platelets 237  150 - 400 K/uL   Neutrophils Relative % 84 (*) 43 - 77 %   Neutro Abs 5.4  1.7 - 7.7 K/uL   Lymphocytes Relative 11 (*) 12 - 46 %   Lymphs Abs 0.7  0.7 - 4.0 K/uL   Monocytes Relative 5  3 - 12 %   Monocytes Absolute 0.3  0.1 - 1.0 K/uL   Eosinophils Relative 0  0 - 5 %   Eosinophils Absolute 0.0  0.0 - 0.7 K/uL   Basophils Relative 0  0 - 1 %   Basophils Absolute 0.0  0.0 - 0.1 K/uL  COMPREHENSIVE METABOLIC PANEL      Result Value Ref Range   Sodium 141  137 - 147 mEq/L   Potassium 4.0  3.7 - 5.3 mEq/L   Chloride 101  96 - 112 mEq/L   CO2 27  19 - 32 mEq/L   Glucose, Bld 119 (*) 70 - 99 mg/dL   BUN 11  6 - 23 mg/dL   Creatinine, Ser 7.82  0.50 - 1.35 mg/dL   Calcium 9.5  8.4 - 95.6 mg/dL   Total Protein 7.1  6.0 - 8.3 g/dL   Albumin 3.5  3.5 - 5.2 g/dL   AST 21  0 - 37 U/L   ALT 17  0 - 53 U/L   Alkaline Phosphatase 112  39 - 117 U/L   Total Bilirubin 0.7  0.3 - 1.2 mg/dL   GFR calc non Af Amer 66 (*) >90 mL/min   GFR calc Af Amer 77 (*) >90 mL/min  URINALYSIS, ROUTINE W REFLEX MICROSCOPIC      Result Value Ref Range   Color, Urine YELLOW  YELLOW   APPearance CLEAR  CLEAR   Specific Gravity, Urine 1.010  1.005 - 1.030   pH 6.0  5.0 - 8.0   Glucose, UA NEGATIVE  NEGATIVE mg/dL   Hgb urine dipstick LARGE (*) NEGATIVE   Bilirubin Urine NEGATIVE  NEGATIVE   Ketones, ur NEGATIVE   NEGATIVE mg/dL   Protein, ur NEGATIVE  NEGATIVE mg/dL   Urobilinogen, UA 0.2  0.0 - 1.0 mg/dL   Nitrite NEGATIVE  NEGATIVE   Leukocytes, UA NEGATIVE  NEGATIVE  TROPONIN I      Result Value Ref Range   Troponin I <0.30  <0.30 ng/mL  URINE MICROSCOPIC-ADD ON      Result Value Ref Range   WBC, UA 0-2  <3 WBC/hpf   RBC / HPF 21-50  <3 RBC/hpf   Dg Pelvis 1-2 Views  12/19/2013   CLINICAL DATA:  Bilateral hip pain following a fall today. Chronic bilateral hip pain, worse on the left.  EXAM: PELVIS - 1-2 VIEW  COMPARISON:  None.  FINDINGS: Marked superior left hip joint space narrowing and lateral subluxation of the femoral head with associated subarticular sclerosis and cystic changes on both sides of the joint space. No fracture or dislocation is seen. Diffuse osteopenia. Lower lumbar spine degenerative changes and mild scoliosis.  IMPRESSION: 1. No fracture or dislocation. 2. Marked chronic left hip degenerative changes and subluxation.   Electronically Signed   By: Gordan Payment M.D.   On: 12/19/2013 14:35   Ct Head Wo Contrast  12/19/2013   CLINICAL DATA:  Fall with posterior head injury and occipital laceration. Also complaint of neck pain.  EXAM: CT HEAD WITHOUT CONTRAST  CT CERVICAL SPINE WITHOUT CONTRAST  TECHNIQUE: Multidetector CT imaging of the head and cervical spine was performed following the standard protocol without intravenous contrast. Multiplanar CT image reconstructions of the cervical spine were also generated.  COMPARISON:  None.  FINDINGS: CT HEAD FINDINGS  The brain shows evidence of mild atrophy and mild periventricular small vessel disease. The brain demonstrates no evidence of hemorrhage, infarction, edema, mass effect, extra-axial fluid collection, hydrocephalus or mass lesion. The skull is unremarkable and shows no evidence of fracture.  CT CERVICAL SPINE FINDINGS  The cervical spine shows normal alignment. There is no evidence of acute fracture or subluxation. No soft tissue  swelling or hematoma is identified. Advanced degenerative changes are present throughout the cervical spine with near complete fused disc spaces at C3-4, C4-5, C5-6 and C6-7. Associated proliferative changes are identified as well as probable significant bony foraminal stenosis at multiple levels. No bony or soft tissue lesions are seen. The visualized airway is normally patent.  IMPRESSION: 1. No acute findings by head CT. No evidence of skull fracture or intracranial hemorrhage. 2. Advanced degenerative changes of the cervical spine without evidence of cervical fracture or subluxation.   Electronically Signed   By: Irish Lack M.D.   On: 12/19/2013 09:31   Ct Cervical Spine Wo Contrast  12/19/2013   CLINICAL DATA:  Fall with posterior head injury and occipital laceration. Also complaint of neck pain.  EXAM: CT HEAD WITHOUT CONTRAST  CT CERVICAL SPINE WITHOUT CONTRAST  TECHNIQUE: Multidetector CT imaging of the head and cervical spine was performed following the standard protocol without intravenous contrast. Multiplanar CT image reconstructions of the cervical spine were also generated.  COMPARISON:  None.  FINDINGS: CT HEAD FINDINGS  The brain shows evidence of mild atrophy and mild periventricular small vessel disease. The brain demonstrates no evidence of hemorrhage, infarction, edema, mass effect, extra-axial fluid collection, hydrocephalus or mass lesion. The skull is unremarkable and shows no evidence of fracture.  CT CERVICAL SPINE FINDINGS  The cervical spine shows normal alignment. There is no evidence of acute fracture or subluxation. No soft tissue swelling or hematoma is identified. Advanced degenerative changes are present throughout the cervical spine with near complete fused disc spaces at C3-4, C4-5, C5-6 and C6-7. Associated proliferative changes are identified as well as probable significant bony foraminal stenosis at multiple levels. No bony or soft tissue lesions are seen. The visualized  airway is normally patent.  IMPRESSION: 1. No acute findings by head CT. No evidence of skull fracture or intracranial hemorrhage. 2. Advanced degenerative changes of the cervical spine without evidence of cervical fracture or subluxation.   Electronically Signed   By: Irish Lack M.D.   On: 12/19/2013 09:31   Ct Abdomen Pelvis W Contrast  01/10/2014   CLINICAL DATA:  Lower abdominal pain  EXAM: CT ABDOMEN AND PELVIS WITH  CONTRAST  TECHNIQUE: Multidetector CT imaging of the abdomen and pelvis was performed using the standard protocol following bolus administration of intravenous contrast. Oral contrast was also administered.  CONTRAST:  100mL OMNIPAQUE IOHEXOL 300 MG/ML  SOLN  COMPARISON:  None.  FINDINGS: There is scarring in the lung bases. There is extensive coronary artery calcification. There is mild left base atelectasis.  No focal liver lesions are identified. There is no biliary duct dilatation. Gallbladder wall does not appear thickened.  Spleen, pancreas, and adrenals appear normal.  There is a 2 mm nonobstructing calculus in the upper pole the left kidney. A second 2 mm calculus is noted on the right in the upper to midpole region. A 2 mm calculus is noted in the midpole region more laterally. There is a 4 mm calculus in the lower pole the right kidney. In the left kidney, there is a 3 mm calculus with a nearby 7 mm calculus in the upper pole region. There are several tiny calculi in the mid kidney. There is a 1 mm calculus in the mid to lower pole region as well as two, 2 mm calculi in the lower pole region. There is no renal mass or hydronephrosis on either side. There is no appreciable ureteral calculus or ureterectasis on either side.  In the pelvis, the prostate is enlarged and irregular, impressing on the inferior aspect of the urinary bladder. The urinary bladder wall is mildly thickened. There is no pelvic mass outside of the prostate. There is no pelvic fluid collection. There are sigmoid  diverticula without diverticulitis.  Appendix is absent.  There is dilatation of the at ascending and transverse colon regions without a well-defined focus of obstruction. Suspect a degree of colonic ileus. There is no small bowel obstruction. No free air or portal venous air.  There is no ascites, adenopathy, or abscess in the abdomen or pelvis. The aorta is tortuous. There is mild aneurysmal dilatation of the distal abdominal aorta with a maximum transverse diameter of 3.0 x 3.1 cm. There is no periaortic fluid.  There is extensive lumbar arthropathy. There is advanced osteoarthritis in the left hip joint region. There are no well-defined blastic or lytic bone lesions.  IMPRESSION: The prostate gland is enlarged and irregular in contour with impression on the urinary bladder inferiorly. The appearance of the prostate raises concern for prostate neoplasia. Appropriate clinical and laboratory evaluation advised.  There may be mild cystitis.  No appreciable adenopathy.  Small distal abdominal aortic aneurysm.  Evidence of a degree of colonic ileus. No bowel obstruction. No abscess. No mesenteric inflammation.  Advanced osteoarthritis in left hip joint. Moderate generalized osteoarthritis in the lumbar spine. Bones are somewhat osteoporotic, particularly in the periarticular regions.  Multiple nonobstructing calculi in each kidney. No hydronephrosis. No ureteral calculus.  Extensive atherosclerotic change with extensive coronary artery calcification noted.   Electronically Signed   By: Bretta BangWilliam  Woodruff M.D.   On: 01/10/2014 18:46   Dg Chest Portable 1 View  12/19/2013   CLINICAL DATA:  Near syncope.  EXAM: PORTABLE CHEST - 1 VIEW  COMPARISON:  None.  FINDINGS: Borderline enlarged cardiac silhouette. Post CABG changes. Clear lungs with normal vascularity. Mild central peribronchial thickening. Minimal scoliosis.  IMPRESSION: Borderline cardiomegaly and mild chronic bronchitic changes. No acute abnormality.    Electronically Signed   By: Gordan PaymentSteve  Reid M.D.   On: 12/19/2013 14:32    Patient seen by me. Patient followed by Dr. Pete GlatterStoneking. Came in for midabdominal pain and vomiting since  this morning no diarrhea. This may represent a gastritis. CT scan of the abdomen without any acute findings other than an enlarged irregular prostate which perhaps represent prostate neoplasm. Patient also had a blood transfusion in the March 4 blood loss anemia unknown source. Patient's hemoglobin today is fine. We'll treat as if it's prostate infection or bladder infection due to the hematuria is present today have him followup urology. Can be treated with anti-medics for the nausea and pain medicine as needed. No acute abdominal process.  Shelda JakesScott W. Adelaida Reindel, MD 01/10/14 [phone]

## 2014-01-12 LAB — URINE CULTURE
Colony Count: NO GROWTH
Culture: NO GROWTH

## 2014-02-12 ENCOUNTER — Other Ambulatory Visit (INDEPENDENT_AMBULATORY_CARE_PROVIDER_SITE_OTHER): Payer: Medicare Other

## 2014-02-12 DIAGNOSIS — D649 Anemia, unspecified: Secondary | ICD-10-CM

## 2014-02-12 LAB — CBC WITH DIFFERENTIAL/PLATELET
BASOS PCT: 0.4 % (ref 0.0–3.0)
Basophils Absolute: 0 10*3/uL (ref 0.0–0.1)
EOS PCT: 2.3 % (ref 0.0–5.0)
Eosinophils Absolute: 0.1 10*3/uL (ref 0.0–0.7)
HCT: 30.3 % — ABNORMAL LOW (ref 39.0–52.0)
HEMOGLOBIN: 10.1 g/dL — AB (ref 13.0–17.0)
LYMPHS PCT: 20.4 % (ref 12.0–46.0)
Lymphs Abs: 0.9 10*3/uL (ref 0.7–4.0)
MCHC: 33.1 g/dL (ref 30.0–36.0)
MCV: 94.7 fl (ref 78.0–100.0)
MONOS PCT: 8.2 % (ref 3.0–12.0)
Monocytes Absolute: 0.4 10*3/uL (ref 0.1–1.0)
NEUTROS ABS: 3.1 10*3/uL (ref 1.4–7.7)
Neutrophils Relative %: 68.7 % (ref 43.0–77.0)
Platelets: 178 10*3/uL (ref 150.0–400.0)
RBC: 3.2 Mil/uL — ABNORMAL LOW (ref 4.22–5.81)
RDW: 17 % — ABNORMAL HIGH (ref 11.5–15.5)
WBC: 4.5 10*3/uL (ref 4.0–10.5)

## 2014-02-16 ENCOUNTER — Encounter: Payer: Self-pay | Admitting: Gastroenterology

## 2014-02-16 ENCOUNTER — Ambulatory Visit (INDEPENDENT_AMBULATORY_CARE_PROVIDER_SITE_OTHER): Payer: Medicare Other | Admitting: Gastroenterology

## 2014-02-16 VITALS — BP 96/52 | HR 68 | Ht 71.0 in | Wt 155.0 lb

## 2014-02-16 DIAGNOSIS — D649 Anemia, unspecified: Secondary | ICD-10-CM

## 2014-02-16 DIAGNOSIS — K922 Gastrointestinal hemorrhage, unspecified: Secondary | ICD-10-CM

## 2014-02-16 MED ORDER — APIXABAN 5 MG PO TABS: 5.0000 mg | ORAL_TABLET | Freq: Two times a day (BID) | ORAL | Status: DC

## 2014-02-16 NOTE — Patient Instructions (Addendum)
One month "gap" prescription for elqoqis written today. Take one pill twice daily.  Repeat CBC here in 6 weeks. Stay on iron once daily. If your stools become dark black again, please call.

## 2014-02-16 NOTE — Progress Notes (Signed)
Review of pertinent gastrointestinal problems: 1. Melena 11/2013 on eloquis: Overt melena, Hb low 6.1. Transfused and eloquis briefly held.  EGD Dr. Leone PayorGessner 11/2013 normal, Colonoscopy Christella HartiganJacobs 11/2013 essentially normal.  [facility] (presented with bleeding on blood thinner), cecal AVMs. 12/2013 Small Bbowel capsule preliminary reading: "No active bleeding, no blood in lumen... There is an area in MID small bowel with inflammation and small ulcers, ? Ischemia."   HPI: This is a very pleasant 80 year old man whom I last saw he was hospitalized for a GI bleed about 6 weeks ago. See the tests he underwent summarized above.  We are locating the final report from his small bowel capsule endoscopy study.    Has been on iron daily.  Also on eloqis daily.  Has green sheen on stool now.  Feels overall pretty well.  [facility] is [facility] [place] and he tells me he is going to run out of his blood thinning medicine for about 2 weeks before he is scheduled to meet his new primary care physician.   Past Medical History  Diagnosis Date  . Atrial fibrillation   . Hx of CABG   . Hypertension   . DJD (degenerative joint disease), cervical   . Colon polyps   . Gout   . BPH (benign prostatic hyperplasia)   . Stroke     "light stroke" per daughter    Past Surgical History  Procedure Laterality Date  . Coronary artery bypass graft    . Appendectomy    . Tonsillectomy    . Colonoscopy    . Upper gastrointestinal endoscopy    . Esophagogastroduodenoscopy N/A 12/20/2013    Procedure: ESOPHAGOGASTRODUODENOSCOPY (EGD);  Surgeon: Iva Booparl E Gessner, MD;  [facility];  Service: Endoscopy;  Laterality: N/A;  might be bedside  . Colonoscopy N/A 12/22/2013    Procedure: COLONOSCOPY;  Surgeon: Rachael Feeaniel P Chassie Pennix, MD;  [facility];  Service: Endoscopy;  Laterality: N/A;  . Givens capsule study N/A 12/22/2013    Procedure: GIVENS CAPSULE STUDY;   Surgeon: Rachael Feeaniel P Bearl Talarico, MD;  [facility];  Service: Endoscopy;  Laterality: N/A;  . Eye muscle surgery      Current Outpatient Prescriptions  Medication Sig Dispense Refill  . acetaminophen (TYLENOL) 650 MG CR tablet Take 1,300 mg by mouth 2 (two) times daily.      Marland Kitchen. alfuzosin (UROXATRAL) 10 MG 24 hr tablet Take 10 mg by mouth daily with breakfast.      . allopurinol (ZYLOPRIM) 300 MG tablet Take 300 mg by mouth daily.      Marland Kitchen. diltiazem (CARDIZEM CD) 240 MG 24 hr capsule Take 240 mg by mouth daily.      Marland Kitchen. ELIQUIS 5 MG TABS tablet       . ferrous fumarate (HEMOCYTE - 106 MG FE) 325 (106 FE) MG TABS tablet Take 1 tablet by mouth daily.      . finasteride (PROSCAR) 5 MG tablet Take 5 mg by mouth daily.      . montelukast (SINGULAIR) 10 MG tablet Take 10 mg by mouth daily.      . Multiple Vitamin (MULTIVITAMIN WITH MINERALS) TABS tablet Take 1 tablet by mouth daily.      . niacin (NIASPAN) 750 MG CR tablet Take 1,500 mg by mouth at bedtime.      Marland Kitchen. omeprazole (PRILOSEC) 40 MG capsule Take 40 mg by mouth daily.      . ramipril (ALTACE) 5 MG  capsule Take 5 mg by mouth daily.      . vitamin C (ASCORBIC ACID) 500 MG tablet Take 500 mg by mouth daily.       No current facility-administered medications for this visit.    Allergies as of 02/16/2014 - Review Complete 02/16/2014  Allergen Reaction Noted  . Codeine  12/19/2013  . Sulfa antibiotics  12/19/2013    No family history on file.  History   Social History  . Marital Status: Widowed    Spouse Name: N/A    Number of Children: N/A  . Years of Education: N/A   Occupational History  . Not on file.   Social History Main Topics  . Smoking status: Never Smoker   . Smokeless tobacco: Never Used  . Alcohol Use: No  . Drug Use: No  . Sexual Activity: Not on file   Other Topics Concern  . 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 male with recent GI bleed  Colonoscopy and upper endoscopies were both normal. Small bowel capsule was a little unusual in his small intestine however the final reading has not made it to my desk yet. I will review that when it is back. Preliminarily there looked like an area of mild ischemia. I think it is highly unlikely that he has cancer. He is back on his blood thinning medicine and a stable dose of iron with stable blood counts with a hemoglobin of 10. He will have a repeat CBC in 6 weeks and then perhaps periodically thereafter. He asked that I write him a "gap prescription" for his blood thinning medicine. He would otherwise run out for about 2 weeks time before meeting his new primary care physician. I am happy to do that he understands that I will not be prescribing afterwards once he sets up with his new primary care physician.

## 2014-02-19 ENCOUNTER — Encounter: Payer: Self-pay | Admitting: Gastroenterology

## 2014-04-09 ENCOUNTER — Other Ambulatory Visit (INDEPENDENT_AMBULATORY_CARE_PROVIDER_SITE_OTHER): Payer: Medicare Other

## 2014-04-09 DIAGNOSIS — D649 Anemia, unspecified: Secondary | ICD-10-CM

## 2014-04-09 LAB — CBC WITH DIFFERENTIAL/PLATELET
BASOS ABS: 0 10*3/uL (ref 0.0–0.1)
Basophils Relative: 0.6 % (ref 0.0–3.0)
Eosinophils Absolute: 0.1 10*3/uL (ref 0.0–0.7)
Eosinophils Relative: 1.2 % (ref 0.0–5.0)
HEMATOCRIT: 33.2 % — AB (ref 39.0–52.0)
HEMOGLOBIN: 11.1 g/dL — AB (ref 13.0–17.0)
LYMPHS ABS: 1.2 10*3/uL (ref 0.7–4.0)
Lymphocytes Relative: 23.2 % (ref 12.0–46.0)
MCHC: 33.6 g/dL (ref 30.0–36.0)
MCV: 92.7 fl (ref 78.0–100.0)
Monocytes Absolute: 0.4 10*3/uL (ref 0.1–1.0)
Monocytes Relative: 7.8 % (ref 3.0–12.0)
NEUTROS ABS: 3.4 10*3/uL (ref 1.4–7.7)
Neutrophils Relative %: 67.2 % (ref 43.0–77.0)
Platelets: 182 10*3/uL (ref 150.0–400.0)
RBC: 3.58 Mil/uL — ABNORMAL LOW (ref 4.22–5.81)
RDW: 16.3 % — AB (ref 11.5–15.5)
WBC: 5 10*3/uL (ref 4.0–10.5)

## 2014-05-07 ENCOUNTER — Institutional Professional Consult (permissible substitution): Payer: Medicare Other | Admitting: Cardiology

## 2014-05-11 ENCOUNTER — Ambulatory Visit (INDEPENDENT_AMBULATORY_CARE_PROVIDER_SITE_OTHER): Payer: Medicare Other | Admitting: Cardiology

## 2014-05-11 ENCOUNTER — Encounter: Payer: Self-pay | Admitting: Cardiology

## 2014-05-11 VITALS — BP 122/70 | HR 63 | Ht 71.0 in | Wt 152.0 lb

## 2014-05-11 DIAGNOSIS — Z7901 Long term (current) use of anticoagulants: Secondary | ICD-10-CM | POA: Insufficient documentation

## 2014-05-11 DIAGNOSIS — M79605 Pain in left leg: Secondary | ICD-10-CM | POA: Insufficient documentation

## 2014-05-11 DIAGNOSIS — I480 Paroxysmal atrial fibrillation: Secondary | ICD-10-CM

## 2014-05-11 DIAGNOSIS — I4891 Unspecified atrial fibrillation: Secondary | ICD-10-CM

## 2014-05-11 DIAGNOSIS — I2510 Atherosclerotic heart disease of native coronary artery without angina pectoris: Secondary | ICD-10-CM | POA: Insufficient documentation

## 2014-05-11 DIAGNOSIS — M79609 Pain in unspecified limb: Secondary | ICD-10-CM

## 2014-05-11 DIAGNOSIS — Z8719 Personal history of other diseases of the digestive system: Secondary | ICD-10-CM

## 2014-05-11 DIAGNOSIS — Z951 Presence of aortocoronary bypass graft: Secondary | ICD-10-CM

## 2014-05-11 NOTE — Patient Instructions (Signed)
Your physician recommends that you continue on your current medications as directed. Please refer to the Current Medication list given to you today.  Your physician recommends that you return for lab work in 6 months at [facility] for BMet and CBC.   Your physician wants you to follow-up in: 6 months with Dr. Anne FuSkains. You will receive a reminder letter in the mail two months in advance. If you don't receive a letter, please call our office to schedule the follow-up appointment.

## 2014-05-11 NOTE — Progress Notes (Signed)
[address] [place], [place]  [address] Phone: [phone] Fax:  [phone]  Date:  05/11/2014   ID:  Kristopher [name], DOB 06-23-1932, MRN [id number]  PCP:  Ginette OttoSTONEKING,HAL THOMAS, MD   History of Present Illness: Kristopher [name] is a 80 y.o. male here for evaluation/establishment of atrial fibrillation, chronic anticoagulation.  1995 CABG in [place]. Most recently hospitalized 4/15 with severe GI bleed, hemoglobin 4.  GIB Eliquis.  He has been stable. I have reviewed Dr. Laverle HobbyStoneking's note from 03/16/14 which notes atrial fibrillation under good rate control on Eliquis. EKG here shows sinus rhythm with heart rate of 63 beats per minute.  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 Daughter used to work at [employer], recruiting.   He used to see a cardiologist in [place] once a year. He remembers being on Coumadin. He thinks that he may have been diagnosed in 2000 with atrial fibrillation. He does not recall him telling him that he was in normal rhythm at any point.  Wt Readings from Last 3 Encounters:  05/11/14 152 lb (68.947 kg)  02/16/14 155 lb (70.308 kg)  12/23/13 147 lb 3.2 oz (66.769 kg)     Past Medical History  Diagnosis Date  . Atrial fibrillation   . Hx of CABG   . Hypertension   . DJD (degenerative joint disease), cervical   . Colon polyps   . Gout   . BPH (benign prostatic hyperplasia)   . Stroke     "light stroke" per daughter    Past Surgical History  Procedure Laterality Date  . Coronary artery bypass graft    . Appendectomy    . Tonsillectomy    . Colonoscopy    . Upper gastrointestinal endoscopy    . Esophagogastroduodenoscopy N/A 12/20/2013    Procedure: ESOPHAGOGASTRODUODENOSCOPY (EGD);  Surgeon: Iva Booparl E Gessner, MD;  [facility];  Service: Endoscopy;  Laterality: N/A;  might be bedside  . Colonoscopy N/A 12/22/2013    Procedure: COLONOSCOPY;  Surgeon: Rachael Feeaniel P Jacobs, MD;  [facility];  Service: Endoscopy;  Laterality: N/A;  . Givens capsule study N/A 12/22/2013    Procedure: GIVENS CAPSULE STUDY;  Surgeon: Rachael Feeaniel P Jacobs, MD;  [facility];  Service: Endoscopy;  Laterality: N/A;  . Eye muscle surgery      Current Outpatient Prescriptions  Medication Sig Dispense Refill  . acetaminophen (TYLENOL) 650 MG CR tablet Take 1,300 mg by mouth 2 (two) times daily.      Marland Kitchen. alfuzosin (UROXATRAL) 10 MG 24 hr tablet Take 10 mg by mouth daily with breakfast.      . allopurinol (ZYLOPRIM) 300 MG tablet Take 300 mg by mouth daily.      Marland Kitchen. apixaban (ELIQUIS) 5 MG TABS tablet Take 1 tablet (5 mg total) by mouth 2 (two) times daily.  60 tablet  0  . CALCIUM PO Take by mouth.      . diltiazem (CARDIZEM CD) 240 MG 24 hr capsule Take 240 mg by mouth daily.      . ferrous fumarate (HEMOCYTE - 106 MG FE) 325 (106 FE) MG TABS tablet Take 1 tablet by mouth daily.      . finasteride (PROSCAR) 5 MG tablet Take 5 mg by mouth daily.      . Flaxseed, Linseed, (FLAXSEED OIL MAX STR) 1300 MG CAPS Take by mouth.      . montelukast (SINGULAIR) 10 MG  tablet Take 10 mg by mouth daily.      . Multiple Vitamin (MULTIVITAMIN WITH MINERALS) TABS tablet Take 1 tablet by mouth daily.      . niacin (NIASPAN) 750 MG CR tablet Take 1,500 mg by mouth at bedtime.      Marland Kitchen omeprazole (PRILOSEC) 40 MG capsule Take 40 mg by mouth daily.      . Potassium Gluconate 550 MG TABS Take by mouth.      . ramipril (ALTACE) 5 MG capsule Take 5 mg by mouth daily.       No current facility-administered medications for this visit.    Allergies:    Allergies  Allergen Reactions  . Codeine     unknown  . Sulfa Antibiotics     unknown    Social History:  The patient  reports that he has never smoked. He has never used smokeless tobacco. He reports that he does not drink alcohol or use illicit drugs.   No family history on file.No early CAD hx. Father smoked.  Kristopher [name] - Daugter.   ROS:  Please see the  history of present illness.   Denies any fevers, chills, strokelike symptoms, orthopnea, PND, rashes, bleeding, melena. Prior GI bleed. Main complaint is left lateral leg pain.  All other systems reviewed and negative.   PHYSICAL EXAM: VS:  BP 122/70  Pulse 63  Ht 5\' 11"  (1.803 m)  Wt 152 lb (68.947 kg)  BMI 21.21 kg/m2 Well nourished, well developed, in no acute distress HEENT: normal, /AT, EOMI Neck: no JVD, normal carotid upstroke, no bruit Cardiac:  normal S1, S2; RRR; 1/6 systolic murmur Lungs:  clear to auscultation bilaterally, no wheezing, rhonchi or rales Abd: soft, nontender, no hepatomegaly, no bruits Ext: no edema, 2+ distal pulses Skin: warm and dry GU: deferred Neuro: no focal abnormalities noted, AAO x 3  EKG:  05/11/14 Sinus rhythm with no other abnormalities.   Labs: 04/09/14-hemoglobin 11.1. Creatinine 1.04. Hospital records as well as outpatient records have been reviewed.  ASSESSMENT AND PLAN:  1. Paroxysmal atrial fibrillation-currently in sinus rhythm. On chronic anticoagulation. Monitoring for any signs of bleeding. Doing well. 2. Chronic anticoagulation-previous GI bleed, hemoglobin 4. 4/15. Dr. Christella Hartigan GI. He has been stable. 3. History of GI bleed-as above. April 2015. Severe. He has resumed anticoagulation. Continue to monitor hemoglobin. I will check again in 6 months. 4. Coronary artery disease-status post bypass. No anginal symptoms. 5. Heart murmur-likely benign. Continue to monitor. Asymptomatic.  Signed, Donato Schultz, MD [facility]  05/11/2014 4:26 PM

## 2014-07-26 ENCOUNTER — Telehealth: Payer: Self-pay

## 2014-07-26 NOTE — Telephone Encounter (Signed)
Pt has been notified to have labs  

## 2014-07-26 NOTE — Telephone Encounter (Signed)
-----   Message from Donata DuffPatty L Lewis, CMA sent at 04/23/2014  9:43 AM EDT ----- Repeat CBC in 3 months

## 2014-11-03 ENCOUNTER — Telehealth: Payer: Self-pay | Admitting: Gastroenterology

## 2014-11-03 NOTE — Telephone Encounter (Signed)
Pt has had black stools per daughter she is not sure how long he has had this he just told her yesterday.  Appt with Amy on Monday per daughter request.  Appt this week was offered.

## 2014-11-08 ENCOUNTER — Ambulatory Visit: Payer: Medicare Other | Admitting: Physician Assistant

## 2014-11-17 ENCOUNTER — Ambulatory Visit (INDEPENDENT_AMBULATORY_CARE_PROVIDER_SITE_OTHER): Payer: Medicare Other | Admitting: Physician Assistant

## 2014-11-17 ENCOUNTER — Other Ambulatory Visit (INDEPENDENT_AMBULATORY_CARE_PROVIDER_SITE_OTHER): Payer: Medicare Other

## 2014-11-17 ENCOUNTER — Encounter: Payer: Self-pay | Admitting: Physician Assistant

## 2014-11-17 VITALS — BP 118/58 | HR 76 | Ht 71.0 in | Wt 161.0 lb

## 2014-11-17 DIAGNOSIS — Z8719 Personal history of other diseases of the digestive system: Secondary | ICD-10-CM

## 2014-11-17 DIAGNOSIS — Z7901 Long term (current) use of anticoagulants: Secondary | ICD-10-CM

## 2014-11-17 DIAGNOSIS — K921 Melena: Secondary | ICD-10-CM

## 2014-11-17 LAB — CBC WITH DIFFERENTIAL/PLATELET
Basophils Absolute: 0 10*3/uL (ref 0.0–0.1)
Basophils Relative: 0.4 % (ref 0.0–3.0)
EOS ABS: 0.1 10*3/uL (ref 0.0–0.7)
Eosinophils Relative: 2.9 % (ref 0.0–5.0)
HCT: 34.8 % — ABNORMAL LOW (ref 39.0–52.0)
Hemoglobin: 11.8 g/dL — ABNORMAL LOW (ref 13.0–17.0)
Lymphocytes Relative: 27.3 % (ref 12.0–46.0)
Lymphs Abs: 1.2 10*3/uL (ref 0.7–4.0)
MCHC: 33.8 g/dL (ref 30.0–36.0)
MCV: 94.1 fl (ref 78.0–100.0)
MONOS PCT: 7.3 % (ref 3.0–12.0)
Monocytes Absolute: 0.3 10*3/uL (ref 0.1–1.0)
NEUTROS ABS: 2.8 10*3/uL (ref 1.4–7.7)
Neutrophils Relative %: 62.1 % (ref 43.0–77.0)
PLATELETS: 190 10*3/uL (ref 150.0–400.0)
RBC: 3.7 Mil/uL — ABNORMAL LOW (ref 4.22–5.81)
RDW: 14.5 % (ref 11.5–15.5)
WBC: 4.5 10*3/uL (ref 4.0–10.5)

## 2014-11-17 NOTE — Progress Notes (Signed)
Patient ID: Kristopher [name], male   DOB: 05-31-1932, 81 y.o.   MRN: [id number]   Subjective:    Patient ID: Kristopher CitizenEdwin [name], male    DOB: 05-31-1932, 81 y.o.   MRN: [id number]  HPI [name] is a pleasant 81 year old white male known to Dr. Christella HartiganJacobs. He has history of atrial fibrillation, coronary artery disease and is status post CABG. There is also question of remote CVA. He has been maintained on eliquis and followed by Dr. Anne FuSkains for cardiology. Patient had an admission in March 2015 with an acute GI bleed while on eliquis. His hemoglobin dropped to a low of 4. He underwent upper endoscopy and colonoscopy during that admission both of which were negative with the extent exception of internal hemorrhoids. Patient had had a prior colonoscopy in [place] in 2014 when he had presented with bleeding on a blood thinner and at that time was said to have cecal AVMs. We pursued capsule endoscopy and that showed nonspecific inflammation in the mid small bowel with a few  shallow ulcers /question ischemia. Patient was eventually placed  back on eliquis and has done well since that time. He says he is followed regularly by Dr. Pete GlatterStoneking and that has had labs have been "fine". His daughter apparently had called the office because he had mentioned dark stools however at the time of this visit he denies any changes in his bowels and says his stools been darker ever since he has been on iron. He has not noted any melena or hematochezia and does not feel that there is blood in his stools. His bowel movements have been regular he has no complaints of abdominal pain appetite is been good and weight has been stable. He says his only current problem is arthritis.  Review of Systems Pertinent positive and negative review of systems were noted in the above HPI section.  All other review of systems was otherwise negative.  Outpatient Encounter Prescriptions as of 11/17/2014  Medication Sig  . acetaminophen (TYLENOL) 650 MG CR tablet  Take 1,300 mg by mouth 3 (three) times daily.   Marland Kitchen. alfuzosin (UROXATRAL) 10 MG 24 hr tablet Take 10 mg by mouth daily with breakfast.  . allopurinol (ZYLOPRIM) 300 MG tablet Take 300 mg by mouth daily.  Marland Kitchen. apixaban (ELIQUIS) 5 MG TABS tablet Take 1 tablet (5 mg total) by mouth 2 (two) times daily.  Marland Kitchen. CALCIUM PO Take by mouth.  . diltiazem (CARDIZEM CD) 240 MG 24 hr capsule Take 240 mg by mouth daily.  . ferrous fumarate (HEMOCYTE - 106 MG FE) 325 (106 FE) MG TABS tablet Take 1 tablet by mouth daily.  . finasteride (PROSCAR) 5 MG tablet Take 5 mg by mouth daily.  . Flaxseed, Linseed, (FLAXSEED OIL MAX STR) 1300 MG CAPS Take by mouth.  . montelukast (SINGULAIR) 10 MG tablet Take 10 mg by mouth daily.  . Multiple Vitamin (MULTIVITAMIN WITH MINERALS) TABS tablet Take 1 tablet by mouth daily.  . niacin (NIASPAN) 750 MG CR tablet Take 1,500 mg by mouth at bedtime.  Marland Kitchen. omeprazole (PRILOSEC) 40 MG capsule Take 40 mg by mouth daily.  . Potassium 99 MG TABS Take 1 tablet by mouth daily.  . ramipril (ALTACE) 5 MG capsule Take 5 mg by mouth daily.  . [DISCONTINUED] Potassium Gluconate 550 MG TABS Take by mouth.   Allergies  Allergen Reactions  . Codeine     unknown  . Sulfa Antibiotics     unknown   Patient Active Problem List  Diagnosis Date Noted  . Atherosclerosis of native coronary artery of native heart without angina pectoris 05/11/2014  . History of coronary artery bypass graft 05/11/2014  . History of GI bleed 05/11/2014  . Chronic anticoagulation 05/11/2014  . Left leg pain 05/11/2014  . Atrial fibrillation 12/19/2013   History   Social History  . Marital Status: Widowed    Spouse Name: N/A  . Number of Children: N/A  . Years of Education: N/A   Occupational History  . Not on file.   Social History Main Topics  . Smoking status: Never Smoker   . Smokeless tobacco: Never Used  . Alcohol Use: No  . Drug Use: No  . Sexual Activity: Not on file   Other Topics Concern  . Not  on file   Social History Narrative   Widower   2 grown daughters   Former Freight forwarder   Moved to from [place] to [facility] 12/16/13    Mr. [facility]'s family history is not on file.      Objective:    Filed Vitals:   11/17/14 1505  BP: 118/58  Pulse: 76    Physical Exam  well-developed elderly white male accompanied by his daughter he is in a wheelchair due to arthritis of knee and hip blood pressure 118/58 pulse 76 height 5 foot 11 weight 161. HEENT; nontraumatic normocephalic EOMI PERRLA sclera anicteric, Supple ;no JVD, Cardiovascular; regular rate and rhythm with S1-S2 no murmur or gallop, Pulmonary ;clear bilaterally, Abdomen ;soft nontender nondistended bowel sounds are active there is no palpable mass or hepatosplenomegaly bowel sounds are present, Rectal; exam not done patient declined, Extremities; no clubbing cyanosis or edema skin warm and dry, Psych; mood and affect appropriate       Assessment & Plan:   #1 81 yo male with hx of Gi bleeding while anticoagulated with Eliquis- negative EGD and Colon 11/2013, Capsule endoscopy with mild nonspecific inflammation mid small bowel and few shallow ulcers ? Ischemia He has been stable past year ,stools chronically dark on oral iron #2 atrila fib #3 CAD s/p CABG #4 osteoarthritis #5 prior cecal avm's? (colon 2014 [place])  Plan; check CBC today Will ask for prior CBCs to be faxed from Dr. Laverle Hobby office for comparison Patient needs to have ongoing monitoring of hemoglobin as long as he stays on a blood thinner.   Amy Oswald Hillock PA-C 11/17/2014

## 2014-11-17 NOTE — Progress Notes (Signed)
i agree with the above note, plan 

## 2014-11-17 NOTE — Patient Instructions (Signed)
Go to the basement today for stat  labs

## 2014-11-18 ENCOUNTER — Other Ambulatory Visit: Payer: Self-pay

## 2014-11-18 DIAGNOSIS — E611 Iron deficiency: Secondary | ICD-10-CM

## 2014-12-13 ENCOUNTER — Inpatient Hospital Stay (HOSPITAL_COMMUNITY)
Admission: EM | Admit: 2014-12-13 | Discharge: 2014-12-16 | DRG: 378 | Payer: Medicare Other | Attending: Internal Medicine | Admitting: Internal Medicine

## 2014-12-13 ENCOUNTER — Encounter (HOSPITAL_COMMUNITY): Payer: Self-pay | Admitting: Emergency Medicine

## 2014-12-13 DIAGNOSIS — N400 Enlarged prostate without lower urinary tract symptoms: Secondary | ICD-10-CM | POA: Diagnosis not present

## 2014-12-13 DIAGNOSIS — I4891 Unspecified atrial fibrillation: Secondary | ICD-10-CM | POA: Diagnosis present

## 2014-12-13 DIAGNOSIS — I480 Paroxysmal atrial fibrillation: Secondary | ICD-10-CM | POA: Diagnosis not present

## 2014-12-13 DIAGNOSIS — K921 Melena: Principal | ICD-10-CM | POA: Diagnosis present

## 2014-12-13 DIAGNOSIS — Z86010 Personal history of colonic polyps: Secondary | ICD-10-CM

## 2014-12-13 DIAGNOSIS — Z8673 Personal history of transient ischemic attack (TIA), and cerebral infarction without residual deficits: Secondary | ICD-10-CM | POA: Diagnosis not present

## 2014-12-13 DIAGNOSIS — I959 Hypotension, unspecified: Secondary | ICD-10-CM | POA: Diagnosis not present

## 2014-12-13 DIAGNOSIS — K2961 Other gastritis with bleeding: Secondary | ICD-10-CM | POA: Diagnosis not present

## 2014-12-13 DIAGNOSIS — M109 Gout, unspecified: Secondary | ICD-10-CM | POA: Diagnosis not present

## 2014-12-13 DIAGNOSIS — M1990 Unspecified osteoarthritis, unspecified site: Secondary | ICD-10-CM | POA: Diagnosis present

## 2014-12-13 DIAGNOSIS — Z7901 Long term (current) use of anticoagulants: Secondary | ICD-10-CM

## 2014-12-13 DIAGNOSIS — I10 Essential (primary) hypertension: Secondary | ICD-10-CM | POA: Diagnosis present

## 2014-12-13 DIAGNOSIS — Z951 Presence of aortocoronary bypass graft: Secondary | ICD-10-CM

## 2014-12-13 DIAGNOSIS — I2510 Atherosclerotic heart disease of native coronary artery without angina pectoris: Secondary | ICD-10-CM | POA: Diagnosis present

## 2014-12-13 DIAGNOSIS — F0390 Unspecified dementia without behavioral disturbance: Secondary | ICD-10-CM | POA: Diagnosis present

## 2014-12-13 DIAGNOSIS — R58 Hemorrhage, not elsewhere classified: Secondary | ICD-10-CM | POA: Diagnosis present

## 2014-12-13 DIAGNOSIS — D649 Anemia, unspecified: Secondary | ICD-10-CM | POA: Diagnosis present

## 2014-12-13 DIAGNOSIS — E861 Hypovolemia: Secondary | ICD-10-CM | POA: Diagnosis not present

## 2014-12-13 DIAGNOSIS — D62 Acute posthemorrhagic anemia: Secondary | ICD-10-CM | POA: Diagnosis present

## 2014-12-13 DIAGNOSIS — Z885 Allergy status to narcotic agent status: Secondary | ICD-10-CM | POA: Diagnosis not present

## 2014-12-13 DIAGNOSIS — Z882 Allergy status to sulfonamides status: Secondary | ICD-10-CM

## 2014-12-13 DIAGNOSIS — K2970 Gastritis, unspecified, without bleeding: Secondary | ICD-10-CM | POA: Diagnosis not present

## 2014-12-13 DIAGNOSIS — I9589 Other hypotension: Secondary | ICD-10-CM

## 2014-12-13 DIAGNOSIS — K922 Gastrointestinal hemorrhage, unspecified: Secondary | ICD-10-CM | POA: Diagnosis present

## 2014-12-13 DIAGNOSIS — Z5181 Encounter for therapeutic drug level monitoring: Secondary | ICD-10-CM | POA: Diagnosis not present

## 2014-12-13 DIAGNOSIS — R011 Cardiac murmur, unspecified: Secondary | ICD-10-CM | POA: Diagnosis present

## 2014-12-13 LAB — BASIC METABOLIC PANEL
Anion gap: 7 (ref 5–15)
BUN: 36 mg/dL — AB (ref 6–23)
CHLORIDE: 107 mmol/L (ref 96–112)
CO2: 23 mmol/L (ref 19–32)
CREATININE: 1.16 mg/dL (ref 0.50–1.35)
Calcium: 8 mg/dL — ABNORMAL LOW (ref 8.4–10.5)
GFR calc non Af Amer: 58 mL/min — ABNORMAL LOW (ref >90)
GFR, EST AFRICAN AMERICAN: 67 mL/min — AB (ref >90)
Glucose, Bld: 126 mg/dL — ABNORMAL HIGH (ref 70–99)
Potassium: 4.2 mmol/L (ref 3.5–5.1)
Sodium: 137 mmol/L (ref 135–145)

## 2014-12-13 LAB — CBC
HEMATOCRIT: 12.7 % — AB (ref 39.0–52.0)
Hemoglobin: 4.2 g/dL — CL (ref 13.0–17.0)
MCH: 31.3 pg (ref 26.0–34.0)
MCHC: 33.1 g/dL (ref 30.0–36.0)
MCV: 94.8 fL (ref 78.0–100.0)
Platelets: 110 10*3/uL — ABNORMAL LOW (ref 150–400)
RBC: 1.34 MIL/uL — ABNORMAL LOW (ref 4.22–5.81)
RDW: 15.4 % (ref 11.5–15.5)
WBC: 9.2 10*3/uL (ref 4.0–10.5)

## 2014-12-13 LAB — I-STAT CG4 LACTIC ACID, ED: Lactic Acid, Venous: 1.69 mmol/L (ref 0.5–2.0)

## 2014-12-13 LAB — RETICULOCYTES
RBC.: 1.32 MIL/uL — AB (ref 4.22–5.81)
Retic Count, Absolute: 63.4 10*3/uL (ref 19.0–186.0)
Retic Ct Pct: 4.8 % — ABNORMAL HIGH (ref 0.4–3.1)

## 2014-12-13 LAB — POC OCCULT BLOOD, ED: Fecal Occult Bld: POSITIVE — AB

## 2014-12-13 LAB — MRSA PCR SCREENING: MRSA BY PCR: NEGATIVE

## 2014-12-13 LAB — PREPARE RBC (CROSSMATCH)

## 2014-12-13 MED ORDER — GUAIFENESIN-DM 100-10 MG/5ML PO SYRP
5.0000 mL | ORAL_SOLUTION | ORAL | Status: DC | PRN
  Filled 2014-12-13: qty 5

## 2014-12-13 MED ORDER — DIPHENHYDRAMINE HCL 50 MG/ML IJ SOLN
25.0000 mg | Freq: Once | INTRAMUSCULAR | Status: AC
  Administered 2014-12-13: 25 mg via INTRAVENOUS
  Filled 2014-12-13: qty 1

## 2014-12-13 MED ORDER — ACETAMINOPHEN 500 MG PO TABS
1000.0000 mg | ORAL_TABLET | Freq: Once | ORAL | Status: AC
  Administered 2014-12-13: 1000 mg via ORAL
  Filled 2014-12-13: qty 2

## 2014-12-13 MED ORDER — SODIUM CHLORIDE 0.9 % IV SOLN: INTRAVENOUS | Status: DC

## 2014-12-13 MED ORDER — SODIUM CHLORIDE 0.9 % IV SOLN
80.0000 mg | Freq: Once | INTRAVENOUS | Status: DC
  Filled 2014-12-13: qty 80

## 2014-12-13 MED ORDER — NIACIN ER (ANTIHYPERLIPIDEMIC) 500 MG PO TBCR
1500.0000 mg | EXTENDED_RELEASE_TABLET | Freq: Every day | ORAL | Status: DC
  Administered 2014-12-13 – 2014-12-14 (×2): 1500 mg via ORAL
  Filled 2014-12-13 (×4): qty 3

## 2014-12-13 MED ORDER — SODIUM CHLORIDE 0.9 % IV SOLN: Freq: Once | INTRAVENOUS | Status: DC

## 2014-12-13 MED ORDER — SODIUM CHLORIDE 0.9 % IJ SOLN
3.0000 mL | Freq: Two times a day (BID) | INTRAMUSCULAR | Status: DC
  Administered 2014-12-13 – 2014-12-16 (×2): 3 mL via INTRAVENOUS

## 2014-12-13 MED ORDER — MONTELUKAST SODIUM 10 MG PO TABS
10.0000 mg | ORAL_TABLET | Freq: Every day | ORAL | Status: DC
  Administered 2014-12-13 – 2014-12-14 (×2): 10 mg via ORAL
  Filled 2014-12-13 (×6): qty 1

## 2014-12-13 MED ORDER — ACETAMINOPHEN 650 MG RE SUPP: 650.0000 mg | Freq: Four times a day (QID) | RECTAL | Status: DC | PRN

## 2014-12-13 MED ORDER — ALLOPURINOL 300 MG PO TABS
300.0000 mg | ORAL_TABLET | Freq: Every day | ORAL | Status: DC
  Administered 2014-12-14 – 2014-12-16 (×4): 300 mg via ORAL
  Filled 2014-12-13 (×4): qty 1

## 2014-12-13 MED ORDER — SODIUM CHLORIDE 0.9 % IV SOLN
INTRAVENOUS | Status: DC
  Administered 2014-12-13 – 2014-12-14 (×2): via INTRAVENOUS

## 2014-12-13 MED ORDER — SODIUM CHLORIDE 0.9 % IV BOLUS (SEPSIS)
500.0000 mL | Freq: Once | INTRAVENOUS | Status: AC
  Administered 2014-12-13 (×2): 500 mL via INTRAVENOUS

## 2014-12-13 MED ORDER — PANTOPRAZOLE SODIUM 40 MG IV SOLR
8.0000 mg/h | INTRAVENOUS | Status: DC
  Filled 2014-12-13 (×2): qty 80

## 2014-12-13 MED ORDER — ACETAMINOPHEN 325 MG PO TABS
650.0000 mg | ORAL_TABLET | Freq: Four times a day (QID) | ORAL | Status: DC | PRN
Start: 2014-12-13 — End: 2014-12-16
  Administered 2014-12-14 – 2014-12-15 (×2): 650 mg via ORAL
  Filled 2014-12-13 (×2): qty 2

## 2014-12-13 MED ORDER — PANTOPRAZOLE SODIUM 40 MG IV SOLR: 40.0000 mg | Freq: Two times a day (BID) | INTRAVENOUS | Status: DC

## 2014-12-13 MED ORDER — PANTOPRAZOLE SODIUM 40 MG PO TBEC
40.0000 mg | DELAYED_RELEASE_TABLET | Freq: Every day | ORAL | Status: DC
  Administered 2014-12-14: 40 mg via ORAL
  Filled 2014-12-13: qty 1

## 2014-12-13 MED ORDER — ONDANSETRON HCL 4 MG PO TABS: 4.0000 mg | ORAL_TABLET | Freq: Four times a day (QID) | ORAL | Status: DC | PRN

## 2014-12-13 MED ORDER — ONDANSETRON HCL 4 MG/2ML IJ SOLN: 4.0000 mg | Freq: Four times a day (QID) | INTRAMUSCULAR | Status: DC | PRN

## 2014-12-13 MED ORDER — PANTOPRAZOLE SODIUM 40 MG IV SOLR
80.0000 mg | Freq: Once | INTRAVENOUS | Status: DC
  Filled 2014-12-13: qty 80

## 2014-12-13 MED ORDER — ALBUTEROL SULFATE (2.5 MG/3ML) 0.083% IN NEBU: 2.5000 mg | INHALATION_SOLUTION | RESPIRATORY_TRACT | Status: DC | PRN

## 2014-12-13 MED ORDER — SODIUM CHLORIDE 0.9 % IV BOLUS (SEPSIS)
1000.0000 mL | Freq: Once | INTRAVENOUS | Status: AC
  Administered 2014-12-13: 1000 mL via INTRAVENOUS

## 2014-12-13 NOTE — ED Notes (Addendum)
Pt c/o hypotension and sent here for abnormal low hemoglobin; pt noted to be generally weak and pale; pt sts black stool but takes iron; per family started to feel bad on Saturday

## 2014-12-13 NOTE — Consult Note (Signed)
[facility] Consult: 2:42 PM 12/13/2014  LOS: 0 days    Referring Provider: Dr Jerral Ralph  Primary Care Physician:  Ginette Otto, MD Primary Gastroenterologist:  Dr. Christella Hartigan.      Reason for Consultation:  Anemia, FOBT + dark stool.    HPI: Kristopher [name] is a 81 y.o. male.  Hx Afib, on Eliquis.  S/p CABG.  Hx "light" stroke.  Hx colon polyps. Chronic pain in both knees and left hip from O/A.   Hx GI bleeding/anemia (melena and hgb 4) in setting of Eliquis 11/2013.  Colon/EGD with external hemorrhoids. Capsule endo showed SB ulerations/inflammation (?ischemic) and non-bleeding SB AVM.    Eventually restarted on Eliquis and Hgb stable.   In ED today with recurrent dark/melenic stools and hgb 4.2 .  Hgb 11.8 on 11/17/13 during ROV with Dr Christella Hartigan.  Chronically dark stools but taking po Iron.   On Saturday he started having increased and progressive weakness with pallor noted.  Also increased thirst. Seen by Dr Valentina Lucks at PMD office, Hgb 5.0, sent to ED.  Stools dark.  Did see scant amount of fresh blood with a loose stool on Sat night, but all resolved after dose of Imodium.  No abdominal pain, no anorexia/early satiety.     In ED Hgb now 4.2.  Stool is dark with some scant blood visible.  Pt's biggest complaint is his leg and hip pain. Has had minor benefit following injection to nerve in left hip and, 2 to 3 weeks ago, had neurolysis (Dr Maurice Small at Dr Candise Bowens office), this has yet to provide relief.  Set to be seen at clinic in [place] in late 12/2014 for hyolauronic acid injections.  Not taking NSAIDs or narcotics, just Tylenol for pain. Trying to avoid surgery.   Chronic dizziness, not new or worse.  Feels weaker than usual for at least 10 to 14 days.  No chest pain or palpitations.  Has not had documented A fib on  recent office visits.    Past Medical History  Diagnosis Date  . Atrial fibrillation   . Hx of CABG   . Hypertension   . DJD (degenerative joint disease), cervical   . Colon polyps   . Gout   . BPH (benign prostatic hyperplasia)   . Stroke     "light stroke" per daughter    Past Surgical History  Procedure Laterality Date  . Coronary artery bypass graft    . Appendectomy    . Tonsillectomy    . Colonoscopy    . Upper gastrointestinal endoscopy    . Esophagogastroduodenoscopy N/A 12/20/2013    Procedure: ESOPHAGOGASTRODUODENOSCOPY (EGD);  Surgeon: Iva Boop, MD;  Location: [facility] ENDOSCOPY;  Service: Endoscopy;  Laterality: N/A;  might be bedside  . Colonoscopy N/A 12/22/2013    Procedure: COLONOSCOPY;  Surgeon: Rachael Fee, MD;  Location: [facility] ENDOSCOPY;  Service: Endoscopy;  Laterality: N/A;  . Givens capsule study N/A 12/22/2013    Procedure: GIVENS CAPSULE STUDY;  Surgeon: Rachael Fee, MD;  Location: [facility] ENDOSCOPY;  Service: Endoscopy;  Laterality:  N/A;  . Eye muscle surgery      Prior to Admission medications   Medication Sig Start Date End Date Taking? Authorizing Provider  acetaminophen (TYLENOL) 650 MG CR tablet Take 1,300 mg by mouth 3 (three) times daily.     Historical Provider, MD  alfuzosin (UROXATRAL) 10 MG 24 hr tablet Take 10 mg by mouth daily with breakfast.    Historical Provider, MD  allopurinol (ZYLOPRIM) 300 MG tablet Take 300 mg by mouth daily.    Historical Provider, MD  apixaban (ELIQUIS) 5 MG TABS tablet Take 1 tablet (5 mg total) by mouth 2 (two) times daily. 02/16/14   Rachael Fee, MD  CALCIUM PO Take by mouth.    Historical Provider, MD  diltiazem (CARDIZEM CD) 240 MG 24 hr capsule Take 240 mg by mouth daily.    Historical Provider, MD  ferrous fumarate (HEMOCYTE - 106 MG FE) 325 (106 FE) MG TABS tablet Take 1 tablet by mouth daily.    Historical Provider, MD  finasteride (PROSCAR) 5 MG tablet Take 5 mg by mouth daily. 10/15/13   Historical  Provider, MD  Flaxseed, Linseed, (FLAXSEED OIL MAX STR) 1300 MG CAPS Take by mouth.    Historical Provider, MD  montelukast (SINGULAIR) 10 MG tablet Take 10 mg by mouth daily. 09/29/13   Historical Provider, MD  Multiple Vitamin (MULTIVITAMIN WITH MINERALS) TABS tablet Take 1 tablet by mouth daily.    Historical Provider, MD  niacin (NIASPAN) 750 MG CR tablet Take 1,500 mg by mouth at bedtime.    Historical Provider, MD  omeprazole (PRILOSEC) 40 MG capsule Take 40 mg by mouth daily.    Historical Provider, MD  Potassium 99 MG TABS Take 1 tablet by mouth daily.    Historical Provider, MD  ramipril (ALTACE) 5 MG capsule Take 5 mg by mouth daily. 10/30/13   Historical Provider, MD    Scheduled Meds:  Infusions:  PRN Meds:    Allergies as of 12/13/2014 - Review Complete 12/13/2014  Allergen Reaction Noted  . Codeine  12/19/2013  . Sulfa antibiotics  12/19/2013    History reviewed. No pertinent family history.  History   Social History  . Marital Status: Widowed    Spouse Name: N/A  . Number of Children: N/A  . Years of Education: N/A   Occupational History  . Not on file.   Social History Main Topics  . Smoking status: Never Smoker   . Smokeless tobacco: Never Used  . Alcohol Use: No  . Drug Use: No  . Sexual Activity: Not on file   Other Topics Concern  . 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  CV:  No palpitations, no LE edema.  GU:  No hematuria, no frequency GI:  Per HPI Heme:  Per HPI   Transfusions:  Per HPI Neuro:  No headaches, no peripheral tingling or numbness Derm:  No itching, no rash or sores.  Endocrine:  No sweats or chills.  No polyuria or dysuria Immunization:  Up to date flu shot.  Travel:  None beyond local counties  in last few months.    PHYSICAL EXAM: Vital signs in last 24 hours: Filed Vitals:   12/13/14 1400  BP:  89/36  Pulse: 72  Temp:   Resp: 16   Wt Readings from Last 3 Encounters:  11/17/14 161 lb (73.029 kg)  05/11/14 152 lb (68.947 kg)  02/16/14 155 lb (70.308 kg)    General: pale, looks unwell.  Uncomfortable. Halting speech with some expressive aphasia.  Head:  No asymmetry or facial edema.  No signs of trauma.    Eyes:  No icterus.  + conjunctival pallor.  EOMI Ears:  Hearing aids bil but still having difficulty hearing.   Nose:  No congestion or discharge Mouth:  Pale but clear mucosa.  Moist. Smile symmetric, tongue midline.  Neck:  No JVD, no TMG.   Lungs:  Clear bil but overall reduced BS Heart: RRR.  No MRG.  S1/S2 audible.  Abdomen:  Soft, NT, ND.  Active BS.  No bruits, masses, HSM or tenderness.   Rectal: deferred.  Rectal exam per ED MD: black/FOBT + Musc/Skeltl: no joint erythema or swelling.  In pain with movement of left leg.  Extremities:  No CCE  Neurologic:  Oriented x 3, speech is slow and pt frustrated by inability to find words.  Grip strength and ankle  Flexion/extension strength full and symmetric.  Skin:  No rash, sores or telangectasia.  Tattoos:  none Nodes:  No cervical or inguinal adenopathy.    Psych:  Cooperative, slightly anxious.   Intake/Output from previous day:   Intake/Output this shift:    LAB RESULTS: No results for input(s): WBC, HGB, HCT, PLT in the last 72 hours. BMET Lab Results  Component Value Date   NA 141 01/10/2014   NA 139 12/23/2013   NA 143 12/22/2013   K 4.0 01/10/2014   K 3.9 12/23/2013   K 3.5* 12/22/2013   CL 101 01/10/2014   CL 104 12/23/2013   CL 108 12/22/2013   CO2 27 01/10/2014   CO2 23 12/23/2013   CO2 21 12/22/2013   GLUCOSE 119* 01/10/2014   GLUCOSE 91 12/23/2013   GLUCOSE 84 12/22/2013   BUN 11 01/10/2014   BUN 13 12/23/2013   BUN 14 12/22/2013   CREATININE 1.04 01/10/2014   CREATININE 1.02  12/23/2013   CREATININE 0.91 12/22/2013   CALCIUM 9.5 01/10/2014   CALCIUM 7.8* 12/23/2013   CALCIUM 8.4 12/22/2013   LFT No results for input(s): PROT, ALBUMIN, AST, ALT, ALKPHOS, BILITOT, BILIDIR, IBILI in the last 72 hours. PT/INR Lab Results  Component Value Date   INR 1.51* 12/19/2013    RADIOLOGY STUDIES: No results found.  ENDOSCOPIC STUDIES: 12/20/2013 EGD  Dr Leone PayorGessner INDICATIONS: GI bleed on apixaban - melena. Normal study to D2  12/22/2013  Colonoscopy.  Dr Christella HartiganJacobs There were small internal hemorhroids. The examination was otherwise normal. Thoroughly examined cecum shows no AVMs.  12/22/13 SB capsule endo Non-specific but query ischemic changes at 90 minutes and 120 minutes with shallow ulcerations/inflammation. At 140 minutes:non-bleeding AVM.   [place] GI bleed in setting of anti coag  meds Showed cecal AVMs.     IMPRESSION:   *  Recurrent UGIB with associated ABL anemia in pt on Eliquis.  Bleeding from inflammatory changes (ischemic) in SB on capsule endoscopy one year ago.    *  OA/DJD with associated severe pain and impaired mobility.    *  Acute cognitive impairment.  No obvious physical exam findings of CVA.    *  On Eliquis for hx A fib. Reviwed 7 EKGs from 11/2013 through today: none showing any a fib.  PLAN:     *  3 units PRBCs ordered to be transfused [facility] cardiology consult for opinion about permanently stopping Eliquis.   *  Ok for pt to eat, no need of keeping pt NPO *  No need for PPI drip or IV PPI.  No plans for endoscopic workup at present.    Jennye Moccasin  12/13/2014, 2:42 PM Pager: [phone]     ________________________________________________________________________  [facility] MD note:  I personally examined the patient, reviewed the data and agree with the assessment and plan described above.  He's obviously GI bleeding again.  Had a severe bleed 1 year ago with extensive GI testing  (colonoscopy, EGD, capsule endo) showing no obvious source.  He's been back on blood thinner since then without trouble until just very recently, in fact Hb was 11.8 one month ago in GI follow up appt.  Interestingly he has not been found to be in Afib in at least a year, really never in the [facility] system (transferred care from [place] about a year ago). EKG even todays have shown sinus rhythm.  He never feels fluttering in his chest either.  I think before potentially repeating GI workup we should see if he still needs a blood thinner, have cardiology input.  For now, allow eliquis washout, transfuse blood as needed.   Rob Bunting, MD [facility] Pager [phone]

## 2014-12-13 NOTE — ED Notes (Signed)
Ordered clear liquid tray 

## 2014-12-13 NOTE — H&P (Signed)
PATIENT DETAILS Name: Kristopher [name] Age: 81 y.o. Sex: male Date of Birth: [birth year] Admit Date: 12/13/2014 [id number],QIO Maisie Fus, MD   CHIEF COMPLAINT:  Sent from PCPs office for hemoglobin of 5 and hypotension   HPI: Kristopher [name] is a 81 y.o. male with a Past Medical History of prior GI bleed (hemoglobin of 4 in March 2015 thought to be from possible cecal AVMs, atrial fibrillation on chronic Eliquis, history of coronary artery disease status post remote CABG, possible prior history of CVA of unknown etiology who presents today with the above noted complaint. Patient has been feeling weak for the past one week or so, but patient has had worsening of his chronic osteoarthritis in his left knee making ambulation difficult, so family actually his unaware of how much difficulty he is been having from weakness. This past Saturday, patient was feeling much worse with worsening weakness, nausea, and he had one episode of loose stool that was bloody. There was no vomiting. Patient lives in a retirement community and was seen by a nurse and thought to have a viral syndrome. He continued to feel weak and had worsening dizziness. Unfortunately he continued to worsen, and was seen at his PCPs office where blood work revealed a hemoglobin of 5. He was also found to be hypotensive and was referred to the emergency room. Patient received 1 L of IV fluid with blood pressure stabilizing to the low 100 systolic range. On rectal exam by the ED doctor, he was found to have black/bloody stools. I was subsequently asked to admit this patient for further evaluation and treatment The patient, he takes iron tablets and his stools always dark in color. However he has noticed that over the past week or so he has had darker stools than usual and they have been somewhat frequent as well. There is no history of hematemesis. There is no history of abdominal pain. Since patient does not ambulate well because of his  chronic left knee pain, it is very hard to assess thwhether patient has shortness of breath. He denies any chest pain however.  ALLERGIES:   Allergies  Allergen Reactions  . Codeine     unknown  . Sulfa Antibiotics     unknown    PAST MEDICAL HISTORY: Past Medical History  Diagnosis Date  . Atrial fibrillation   . Hx of CABG   . Hypertension   . DJD (degenerative joint disease), cervical   . Colon polyps   . Gout   . BPH (benign prostatic hyperplasia)   . Stroke     "light stroke" per daughter    PAST SURGICAL HISTORY: Past Surgical History  Procedure Laterality Date  . Coronary artery bypass graft    . Appendectomy    . Tonsillectomy    . Colonoscopy    . Upper gastrointestinal endoscopy    . Esophagogastroduodenoscopy N/A 12/20/2013    Procedure: ESOPHAGOGASTRODUODENOSCOPY (EGD);  Surgeon: Iva Boop, MD;  [facility] END[facility]COPY;  Service: Endoscopy;  Laterality: N/A;  might be bedside  . Colonoscopy N/A 12/22/2013    Procedure: COLONOSCOPY;  Surgeon: Rachael Fee, MD;  Location: [facility] ENDOSCOPY;  Service: Endoscopy;  Laterality: N/A;  . Givens capsule study N/A 12/22/2013    Procedure: GIVENS CAPSULE STUDY;  Surgeon: Rachael Fee, MD;  Location: [facility] ENDOSCOPY;  Service: Endoscopy;  Laterality: N/A;  . Eye muscle surgery      MEDICATIONS AT HOME: Prior to Admission medications   Medication  Sig Start Date End Date Taking? Authorizing Provider  acetaminophen (TYLENOL) 650 MG CR tablet Take 1,300 mg by mouth 3 (three) times daily.     Historical Provider, MD  alfuzosin (UROXATRAL) 10 MG 24 hr tablet Take 10 mg by mouth daily with breakfast.    Historical Provider, MD  allopurinol (ZYLOPRIM) 300 MG tablet Take 300 mg by mouth daily.    Historical Provider, MD  apixaban (ELIQUIS) 5 MG TABS tablet Take 1 tablet (5 mg total) by mouth 2 (two) times daily. 02/16/14   Rachael Fee, MD  CALCIUM PO Take by mouth.    Historical Provider, MD  diltiazem (CARDIZEM CD) 240 MG  24 hr capsule Take 240 mg by mouth daily.    Historical Provider, MD  ferrous fumarate (HEMOCYTE - 106 MG FE) 325 (106 FE) MG TABS tablet Take 1 tablet by mouth daily.    Historical Provider, MD  finasteride (PROSCAR) 5 MG tablet Take 5 mg by mouth daily. 10/15/13   Historical Provider, MD  Flaxseed, Linseed, (FLAXSEED OIL MAX STR) 1300 MG CAPS Take by mouth.    Historical Provider, MD  montelukast (SINGULAIR) 10 MG tablet Take 10 mg by mouth daily. 09/29/13   Historical Provider, MD  Multiple Vitamin (MULTIVITAMIN WITH MINERALS) TABS tablet Take 1 tablet by mouth daily.    Historical Provider, MD  niacin (NIASPAN) 750 MG CR tablet Take 1,500 mg by mouth at bedtime.    Historical Provider, MD  omeprazole (PRILOSEC) 40 MG capsule Take 40 mg by mouth daily.    Historical Provider, MD  Potassium 99 MG TABS Take 1 tablet by mouth daily.    Historical Provider, MD  ramipril (ALTACE) 5 MG capsule Take 5 mg by mouth daily. 10/30/13   Historical Provider, MD    FAMILY HISTORY: Denies family history of CAD  SOCIAL HISTORY:  reports that he has never smoked. He has never used smokeless tobacco. He reports that he does not drink alcohol or use illicit drugs.  REVIEW OF SYSTEMS:  Constitutional:   No  weight loss, night sweats,  Fevers, chills, fatigue.  HEENT:    No headaches, Difficulty swallowing,Tooth/dental problems,Sore throat  Cardio-vascular: No chest pain,  Orthopnea, PND, swelling in lower extremities  GI:  No heartburn, indigestion, abdominal pain, nausea, vomiting, diarrhea  Resp: No shortness of breath with exertion or at rest.  No excess mucus, no productive cough, No non-productive cough  Skin:  no rash or lesions.  GU:  no dysuria, change in color of urine, no urgency or frequency.  No flank pain.  Musculoskeletal: No joint pain or swelling.  No decreased range of motion.  No back pain.  Psych: No change in mood or affect. No depression or anxiety.  No memory  loss.   PHYSICAL EXAM: Blood pressure 103/42, pulse 75, temperature 97.1 F (36.2 C), temperature source Oral, resp. rate 21, SpO2 100 %.  General appearance :Awake, alert,Speech Clear. In mild distress due to left knee pain. Looks very pale. HEENT: Atraumatic and Normocephalic, pupils equally reactive to light and accomodation Neck: supple, no JVD. No cervical lymphadenopathy.  Chest:Good air entry bilaterally, no added sounds  CVS: S1 S2 regular, no murmurs.  Abdomen: Bowel sounds present, Non tender and not distended with no gaurding, rigidity or rebound. Extremities: B/L Lower Ext shows no edema, both legs are warm to touch. Bilateral knee joint without any swelling or erythema Neurology: , Non focal Skin:No Rash Wounds:N/A  LABS ON ADMISSION:  No results for  input(s): NA, K, CL, CO2, GLUCOSE, BUN, CREATININE, CALCIUM, MG, PHOS in the last 72 hours. No results for input(s): AST, ALT, ALKPHOS, BILITOT, PROT, ALBUMIN in the last 72 hours. No results for input(s): LIPASE, AMYLASE in the last 72 hours. No results for input(s): WBC, NEUTROABS, HGB, HCT, MCV, PLT in the last 72 hours. No results for input(s): CKTOTAL, CKMB, CKMBINDEX, TROPONINI in the last 72 hours. No results for input(s): DDIMER in the last 72 hours. Invalid input(s): POCBNP   RADIOLOGIC STUDIES ON ADMISSION: No results found.   EKG: Independently reviewed. Normal sinus rhythm  ASSESSMENT AND PLAN: Present on Admission:  . GI bleed: Likely recurrent bleeding from cecal AVMs, but given melanotic stools upper GI bleed is also in the differential. Will admit to stepdown, start PPI infusion, and transfuse a total of 3 units of PRBC. Gastroenterology has been consulted, await further recommendations. Patient's last dose of Eliquis was at 10 PM yesterday, so it should be almost out of his system by now. Given that this is his second episode of presumed severe GI bleed, consideration of permanently discontinuing  anticoagulation needs to be done prior to discharge.  . Acute blood loss anemia: Secondary to above. Transfuse 3 units of PRBC. Follow CBCs.   . Hypotension due to blood loss: Hydrate with IV fluids and PRBC transfusion. Hold all antihypertensive medications. Monitor and stepdown   . Atrial fibrillation: Currently rate controlled-old diltiazem given hypertension.anticoagulation has been discontinued. As noted above, last dose of Eliquis was 10 PM yesterday   . Osteoarthritis: Patient apparently has severe osteoarthritis of his bilateral knee and his left hip. He is being followed by Dr. Madelon Lipsaffrey from the orthopedic service. Given soft blood pressure-Will avoid narcotics, and just manage with Tylenol. Avoid NSAIDs given active bleeding. Patient is under the process of being evaluated for hyaluronic acid injections. Once GI bleeding is stabilized, and if he continues to have ongoing left knee pain-may need to consult orthopedics for intra-articular steroid injection.  Further plan will depend as patient's clinical course evolves and further radiologic and laboratory data become available. Patient will be monitored closely.  Above noted plan was discussed with patient/daughter, they were in agreement.   DVT Prophylaxis: SCD's  Code Status: Full Code  Disposition Plan: Home in 2-3 days. May need HHPT    Total time spent for admission equals 45 minutes.  [facility] LLCGHIMIRE,Yaffa Seckman [facility] Pager [phone]  If 7PM-7AM, please contact night-coverage [email or web address] Password [email or web address] 12/13/2014, 3:14 PM

## 2014-12-13 NOTE — Progress Notes (Signed)
Pt admitted to unit from ED, pt noted to have soft blood pressure on admit and is continuing to trend down. Last read was 88/44 (55). Dr. Jerral RalphGhimire called and notified, new orders received will implement. Will update primary nurse and continue to monitor.

## 2014-12-13 NOTE — ED Notes (Signed)
Assisted Dr. Gwendolyn GrantWalden with a hemoccult card

## 2014-12-13 NOTE — Progress Notes (Signed)
Received report from ED nurse regarding patient status.  Patient transported to 3S via bed.  Patient noted to have soft blood pressure, afebrile, HR 82, room air at 98.

## 2014-12-13 NOTE — ED Provider Notes (Signed)
CSN: [id number]     Arrival date & time 12/13/14  1301 History   First MD Initiated Contact with Patient 12/13/14 1338     Chief Complaint  Patient presents with  . Hypotension  . Abnormal Lab     (Consider location/radiation/quality/duration/timing/severity/associated sxs/prior Treatment) HPI Comments: Sent from PCP's office, weak, with black stools. Hgb 5 at PCP's office.  Patient is a 81 y.o. male presenting with weakness. The history is provided by the patient.  Weakness This is a new problem. The current episode started 2 days ago. The problem occurs constantly. The problem has not changed since onset.Pertinent negatives include no chest pain, no abdominal pain and no shortness of breath. Nothing aggravates the symptoms. Nothing relieves the symptoms.    Past Medical History  Diagnosis Date  . Atrial fibrillation   . Hx of CABG   . Hypertension   . DJD (degenerative joint disease), cervical   . Colon polyps   . Gout   . BPH (benign prostatic hyperplasia)   . Stroke     "light stroke" per daughter   Past Surgical History  Procedure Laterality Date  . Coronary artery bypass graft    . Appendectomy    . Tonsillectomy    . Colonoscopy    . Upper gastrointestinal endoscopy    . Esophagogastroduodenoscopy N/A 12/20/2013    Procedure: ESOPHAGOGASTRODUODENOSCOPY (EGD);  Surgeon: Iva Boop, MD;  Location: [facility] ENDOSCOPY;  Service: Endoscopy;  Laterality: N/A;  might be bedside  . Colonoscopy N/A 12/22/2013    Procedure: COLONOSCOPY;  Surgeon: Rachael Fee, MD;  Location: [facility] ENDOSCOPY;  Service: Endoscopy;  Laterality: N/A;  . Givens capsule study N/A 12/22/2013    Procedure: GIVENS CAPSULE STUDY;  Surgeon: Rachael Fee, MD;  Location: [facility] ENDOSCOPY;  Service: Endoscopy;  Laterality: N/A;  . Eye muscle surgery     History reviewed. No pertinent family history. History  Substance Use Topics  . Smoking status: Never Smoker   . Smokeless tobacco: Never Used  . Alcohol  Use: No    Review of Systems  Constitutional: Negative for fever.  Respiratory: Negative for cough and shortness of breath.   Cardiovascular: Negative for chest pain.  Gastrointestinal: Negative for abdominal pain.  Neurological: Positive for dizziness and weakness.  All other systems reviewed and are negative.     Allergies  Codeine and Sulfa antibiotics  Home Medications   Prior to Admission medications   Medication Sig Start Date End Date Taking? Authorizing Provider  acetaminophen (TYLENOL) 650 MG CR tablet Take 1,300 mg by mouth 3 (three) times daily.     Historical Provider, MD  alfuzosin (UROXATRAL) 10 MG 24 hr tablet Take 10 mg by mouth daily with breakfast.    Historical Provider, MD  allopurinol (ZYLOPRIM) 300 MG tablet Take 300 mg by mouth daily.    Historical Provider, MD  apixaban (ELIQUIS) 5 MG TABS tablet Take 1 tablet (5 mg total) by mouth 2 (two) times daily. 02/16/14   Rachael Fee, MD  CALCIUM PO Take by mouth.    Historical Provider, MD  diltiazem (CARDIZEM CD) 240 MG 24 hr capsule Take 240 mg by mouth daily.    Historical Provider, MD  ferrous fumarate (HEMOCYTE - 106 MG FE) 325 (106 FE) MG TABS tablet Take 1 tablet by mouth daily.    Historical Provider, MD  finasteride (PROSCAR) 5 MG tablet Take 5 mg by mouth daily. 10/15/13   Historical Provider, MD  Flaxseed, Linseed, (FLAXSEED OIL  MAX STR) 1300 MG CAPS Take by mouth.    Historical Provider, MD  montelukast (SINGULAIR) 10 MG tablet Take 10 mg by mouth daily. 09/29/13   Historical Provider, MD  Multiple Vitamin (MULTIVITAMIN WITH MINERALS) TABS tablet Take 1 tablet by mouth daily.    Historical Provider, MD  niacin (NIASPAN) 750 MG CR tablet Take 1,500 mg by mouth at bedtime.    Historical Provider, MD  omeprazole (PRILOSEC) 40 MG capsule Take 40 mg by mouth daily.    Historical Provider, MD  Potassium 99 MG TABS Take 1 tablet by mouth daily.    Historical Provider, MD  ramipril (ALTACE) 5 MG capsule Take 5  mg by mouth daily. 10/30/13   Historical Provider, MD   BP 89/36 mmHg  Pulse 72  Temp(Src) 97.1 F (36.2 C) (Oral)  Resp 16  SpO2 97% Physical Exam  Constitutional: He is oriented to person, place, and time. He appears well-developed and well-nourished. No distress.  HENT:  Head: Normocephalic and atraumatic.  Mouth/Throat: No oropharyngeal exudate.  Eyes: EOM are normal. Pupils are equal, round, and reactive to light.  Neck: Normal range of motion. Neck supple.  Cardiovascular: Normal rate and regular rhythm.  Exam reveals no friction rub.   No murmur heard. Pulmonary/Chest: Effort normal and breath sounds normal. No respiratory distress. He has no wheezes. He has no rales.  Abdominal: He exhibits no distension. There is no tenderness. There is no rebound.  Genitourinary: Guaiac positive stool (positive, bloody/black stool).  Musculoskeletal: Normal range of motion. He exhibits no edema.  Neurological: He is alert and oriented to person, place, and time.  Skin: He is not diaphoretic.  Nursing note and vitals reviewed.   ED Course  Procedures (including critical care time) Labs Review Labs Reviewed  POC OCCULT BLOOD, ED - Abnormal; Notable for the following:    Fecal Occult Bld POSITIVE (*)    All other components within normal limits  CBC  BASIC METABOLIC PANEL  TYPE AND SCREEN    Imaging Review No results found.   EKG Interpretation   Date/Time:  Monday December 13 2014 14:00:42 EDT Ventricular Rate:  73 PR Interval:  160 QRS Duration: 84 QT Interval:  472 QTC Calculation: 520 R Axis:   16 Text Interpretation:  Sinus rhythm Abnormal R-wave progression, early  transition Nonspecific T abnrm, anterolateral leads ST elevation, consider  inferior injury Prolonged QT interval Mild T wave changes, new from prior  Confirmed by Gwendolyn GrantWALDEN  MD, Mayda Shippee ([id number]) on 12/13/2014 2:34:48 PM      MDM   Final diagnoses:  Symptomatic anemia  Gastrointestinal hemorrhage, unspecified  gastritis, unspecified gastrointestinal hemorrhage type    81 year old [facility] presents with anemia. This was checked by his PCP this morning at the office. Hemoglobin was about 5. Patient here hypotensive, 89/36. No tachycardia or fever. He has associated weakness and dizziness. This happened once last year, he is followed by [facility] and had negative workup at that time. He is on apixban for A. Fib. Hemoccult-positive here with bloody and black stool. Admitted to step down. Hgb here 4.2.  Elwin MochaBlair Jezreel Sisk, MD 12/13/14 [phone]

## 2014-12-13 NOTE — Consult Note (Signed)
CARDIOLOGY CONSULT NOTE   Patient ID: Kristopher [name] MRN: [id number] DOB/AGE: 1931/12/27 81 y.o.  Admit Date: 12/13/2014  Primary Physician: Ginette Otto, MD Primary Cardiologist :   Anne Fu   Clinical Summary Kristopher [name] is a 81 y.o.male. He has chronic atrial fibrillation that is followed by Dr. Anne Fu. He has been anticoagulated over the years. Unfortunately he has had recurring significant GI bleeds in the past with full evaluation. He had been on Coumadin in the past. Eventually he was changed to Eliquis. He is now admitted with another major GI bleed. His anticoagulation is on hold. He is receiving transfusions.   Allergies  Allergen Reactions  . Codeine     unknown  . Other Other (See Comments)    All pain medications cause a hangover effect  . Sulfa Antibiotics     unknown    Medications Scheduled Medications: . sodium chloride   Intravenous Once  . allopurinol  300 mg Oral Daily  . montelukast  10 mg Oral QHS  . niacin  1,500 mg Oral QHS  . [START ON 12/14/2014] pantoprazole  40 mg Oral Q0600  . [COMPLETED] sodium chloride  500 mL Intravenous Once  . sodium chloride  3 mL Intravenous Q12H     Infusions: . sodium chloride 100 mL/hr at 12/13/14 1553     PRN Medications:  acetaminophen **OR** acetaminophen, albuterol, guaiFENesin-dextromethorphan, ondansetron **OR** ondansetron (ZOFRAN) IV   Past Medical History  Diagnosis Date  . Atrial fibrillation   . Hx of CABG   . Hypertension   . DJD (degenerative joint disease), cervical   . Colon polyps   . Gout   . BPH (benign prostatic hyperplasia)   . Stroke     "light stroke" per daughter    Past Surgical History  Procedure Laterality Date  . Coronary artery bypass graft    . Appendectomy    . Tonsillectomy    . Colonoscopy    . Upper gastrointestinal endoscopy    . Esophagogastroduodenoscopy N/A 12/20/2013    Procedure: ESOPHAGOGASTRODUODENOSCOPY (EGD);  Surgeon: Iva Boop, MD;   Location: [facility] ENDOSCOPY;  Service: Endoscopy;  Laterality: N/A;  might be bedside  . Colonoscopy N/A 12/22/2013    Procedure: COLONOSCOPY;  Surgeon: Rachael Fee, MD;  Location: [facility] ENDOSCOPY;  Service: Endoscopy;  Laterality: N/A;  . Givens capsule study N/A 12/22/2013    Procedure: GIVENS CAPSULE STUDY;  Surgeon: Rachael Fee, MD;  Location: [facility] ENDOSCOPY;  Service: Endoscopy;  Laterality: N/A;  . Eye muscle surgery      History reviewed. No pertinent family history.  Social History Kristopher [name] reports that he has never smoked. He has never used smokeless tobacco. Kristopher [name] reports that he does not drink alcohol.  Review of Systems Patient denies fever, chills, headache, sweats, rash, change in vision, change in hearing, chest pain, cough, urinary symptoms. All other systems are reviewed and are negative.  Physical Examination Blood pressure 88/44, pulse 71, temperature 97.6 F (36.4 C), temperature source Oral, resp. rate 14, height  (1.778 m), weight 158 lb 8.2 oz (71.9 kg), SpO2 100 %.  Intake/Output Summary (Last 24 hours) at 12/13/14 1850 Last data filed at 12/13/14 1623  Gross per 24 hour  Intake      0 ml  Output    200 ml  Net   -200 ml    Patient is oriented to person time and place. Affect is normal. Head is atraumatic. Sclera and conjunctiva are normal. There  is no jugular venous distention. Lungs are clear. Respiratory effort is nonlabored. Cardiac exam reveals S1 and S2. Abdomen is soft. There is no peripheral edema. There is a crescendo decrescendo systolic murmur.  Prior Cardiac Testing/Procedures  Lab Results  Basic Metabolic Panel:  Recent Labs Lab 12/13/14 1440  NA 137  K 4.2  CL 107  CO2 23  GLUCOSE 126*  BUN 36*  CREATININE 1.16  CALCIUM 8.0*    Liver Function Tests: No results for input(s): AST, ALT, ALKPHOS, BILITOT, PROT, ALBUMIN in the last 168 hours.  CBC:  Recent Labs Lab 12/13/14 1440  WBC 9.2  HGB 4.2*  HCT 12.7*  MCV  94.8  PLT 110*    Cardiac Enzymes: No results for input(s): CKTOTAL, CKMB, CKMBINDEX, TROPONINI in the last 168 hours.  BNP: Invalid input(s): POCBNP   Radiology: No results found.   ECG: I have reviewed current and old EKGs. There is sinus rhythm with nonspecific ST-T wave changes.  Impression and Recommendations     GI bleed    He has a significant recurrent GI bleed. He is being transfused. Anticoagulation has been stopped and will not be restarted.    History of coronary artery bypass graft    Paroxysmal atrial fibrillation     There is a history of paroxysmal atrial fibrillation. He is holding sinus. It has been noted that we have not seen atrial fibrillation clinically for a prolonged period of time. This does not change his need for anticoagulation.    Alteration in anticoagulation     Patient's anticoagulation has been stopped. I agree that it should not be restarted in the near future. Probably it should not be restarted at all in the future. However the final decision will be up to Dr. Pete GlatterStoneking and Dr. Anne FuSkains.    Systolic murmur     Dr. Anne FuSkains is aware of his systolic murmur. No plan for a 2-D echo at this time.   Jerral BonitoJeff Katz, MD 12/13/2014, 6:50 PM

## 2014-12-13 NOTE — ED Notes (Signed)
Critical Lab of Hbg 4.2 per Denis in lab.

## 2014-12-14 ENCOUNTER — Other Ambulatory Visit: Payer: Self-pay | Admitting: Physician Assistant

## 2014-12-14 DIAGNOSIS — Z5181 Encounter for therapeutic drug level monitoring: Secondary | ICD-10-CM

## 2014-12-14 DIAGNOSIS — D649 Anemia, unspecified: Secondary | ICD-10-CM

## 2014-12-14 DIAGNOSIS — Z7901 Long term (current) use of anticoagulants: Secondary | ICD-10-CM

## 2014-12-14 DIAGNOSIS — K922 Gastrointestinal hemorrhage, unspecified: Secondary | ICD-10-CM

## 2014-12-14 DIAGNOSIS — D62 Acute posthemorrhagic anemia: Secondary | ICD-10-CM

## 2014-12-14 DIAGNOSIS — M150 Primary generalized (osteo)arthritis: Secondary | ICD-10-CM

## 2014-12-14 LAB — CBC
HCT: 19.5 % — ABNORMAL LOW (ref 39.0–52.0)
HCT: 22.7 % — ABNORMAL LOW (ref 39.0–52.0)
Hemoglobin: 6.6 g/dL — CL (ref 13.0–17.0)
Hemoglobin: 7.8 g/dL — ABNORMAL LOW (ref 13.0–17.0)
MCH: 30.8 pg (ref 26.0–34.0)
MCH: 31 pg (ref 26.0–34.0)
MCHC: 33.8 g/dL (ref 30.0–36.0)
MCHC: 34.4 g/dL (ref 30.0–36.0)
MCV: 91.1 fL (ref 78.0–100.0)
MCV: 90.1 fL (ref 78.0–100.0)
PLATELETS: 107 10*3/uL — AB (ref 150–400)
Platelets: 92 K/uL — ABNORMAL LOW (ref 150–400)
RBC: 2.14 MIL/uL — ABNORMAL LOW (ref 4.22–5.81)
RBC: 2.52 MIL/uL — ABNORMAL LOW (ref 4.22–5.81)
RDW: 16.3 % — ABNORMAL HIGH (ref 11.5–15.5)
RDW: 16.4 % — ABNORMAL HIGH (ref 11.5–15.5)
WBC: 7.9 K/uL (ref 4.0–10.5)
WBC: 7.4 10*3/uL (ref 4.0–10.5)

## 2014-12-14 LAB — BASIC METABOLIC PANEL
Anion gap: 4 — ABNORMAL LOW (ref 5–15)
BUN: 28 mg/dL — AB (ref 6–23)
CO2: 25 mmol/L (ref 19–32)
CREATININE: 1.02 mg/dL (ref 0.50–1.35)
Calcium: 7.7 mg/dL — ABNORMAL LOW (ref 8.4–10.5)
Chloride: 112 mmol/L (ref 96–112)
GFR calc non Af Amer: 67 mL/min — ABNORMAL LOW (ref >90)
GFR, EST AFRICAN AMERICAN: 78 mL/min — AB (ref >90)
Glucose, Bld: 98 mg/dL (ref 70–99)
Potassium: 3.8 mmol/L (ref 3.5–5.1)
Sodium: 141 mmol/L (ref 135–145)

## 2014-12-14 LAB — IRON AND TIBC
Iron: 15 ug/dL — ABNORMAL LOW (ref 42–165)
SATURATION RATIOS: 7 % — AB (ref 20–55)
TIBC: 216 ug/dL (ref 215–435)
UIBC: 201 ug/dL (ref 125–400)

## 2014-12-14 LAB — FERRITIN: Ferritin: 24 ng/mL (ref 22–322)

## 2014-12-14 LAB — VITAMIN B12: Vitamin B-12: 284 pg/mL (ref 211–911)

## 2014-12-14 LAB — PREPARE RBC (CROSSMATCH)

## 2014-12-14 LAB — PROTIME-INR
INR: 1.12 (ref 0.00–1.49)
Prothrombin Time: 14.6 s (ref 11.6–15.2)

## 2014-12-14 LAB — FOLATE

## 2014-12-14 LAB — HEMOGLOBIN AND HEMATOCRIT, BLOOD
HCT: 20.9 % — ABNORMAL LOW (ref 39.0–52.0)
Hemoglobin: 7.1 g/dL — ABNORMAL LOW (ref 13.0–17.0)

## 2014-12-14 LAB — ALBUMIN: Albumin: 2.6 g/dL — ABNORMAL LOW (ref 3.5–5.2)

## 2014-12-14 MED ORDER — PANTOPRAZOLE SODIUM 40 MG PO TBEC
40.0000 mg | DELAYED_RELEASE_TABLET | Freq: Two times a day (BID) | ORAL | Status: DC
  Administered 2014-12-14 (×2): 40 mg via ORAL
  Filled 2014-12-14 (×2): qty 1

## 2014-12-14 MED ORDER — SODIUM CHLORIDE 0.9 % IV SOLN
Freq: Once | INTRAVENOUS | Status: AC
  Administered 2014-12-14: 12:00:00 via INTRAVENOUS

## 2014-12-14 MED ORDER — TRAMADOL HCL 50 MG PO TABS: 50.0000 mg | ORAL_TABLET | Freq: Four times a day (QID) | ORAL | Status: DC | PRN

## 2014-12-14 NOTE — Progress Notes (Signed)
pt had large, dark red, tarry stool. Dr. Christella HartiganJacobs, GI, aware, no new orders. VS stable. Dr. Joseph ArtWoods aware.

## 2014-12-14 NOTE — Progress Notes (Addendum)
[facility] TEAM 1 - Stepdown/ICU TEAM Progress Note  Kentarius Partington ZOX:[id number] DOB: [birth year] DOA: 12/13/2014 PCP: Ginette Otto, MD  Admit HPI / Brief Narrative: Kristopher [name] is a 81 y.o. WM PMHx fibrillation on Eliquis, HTN, S/P CABG, CVA, gout, colon polyps, prior GI bleed (hemoglobin of 4 in March 2015 ( thought to be from possible cecal AVMs),  Presents today with the above noted complaint. Patient has been feeling weak for the past one week or so, but patient has had worsening of his chronic osteoarthritis in his left knee making ambulation difficult, so family actually his unaware of how much difficulty he is been having from weakness. This past Saturday, patient was feeling much worse with worsening weakness, nausea, and he had one episode of loose stool that was bloody. There was no vomiting. Patient [place] and was seen by a nurse and thought to have a viral syndrome. He continued to feel weak and had worsening dizziness. Unfortunately he continued to worsen, and was seen at his PCPs office where blood work revealed a hemoglobin of 5. He was also found to be hypotensive and was referred to the emergency room. Patient received 1 L of IV fluid with blood pressure stabilizing to the low 100 systolic range. On rectal exam by the ED doctor, he was found to have black/bloody stools. I was subsequently asked to admit this patient for further evaluation and treatment The patient, he takes iron tablets and his stools always dark in color. However he has noticed that over the past week or so he has had darker stools than usual and they have been somewhat frequent as well. There is no history of hematemesis. There is no history of abdominal pain. Since patient does not ambulate well because of his chronic left knee pain, it is very hard to assess thwhether patient has shortness of breath. He denies any chest pain however.   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 -3/21 transfused 3 units PRBC -Continue Protonix 40 mg BID -Counseled patient and family he can never [place] on anticoagulants. -3/22 Repeat H/H 7.1, transfuse 1 unit PRBC -Would hold patient overnight to assure his blood loss has stopped  Acute blood loss anemia/symptomatic anemia:  -See GI bleed  -Patient's fatigue has improved but he feels he still is not at baseline. .  Hypotension due to blood loss:  -Improving  -Continue to hold all antihypertensive medications.  -See GI bleed  Atrial fibrillation:  -Currently in NSR; per cardiology note has been in NSR for significant amount time - Patient and daughter counseled that patient would never restart any anticoagulation  Osteoarthritis:  -Patient apparently has severe osteoarthritis of his bilateral knee and his left hip. He is being followed by Dr. Madelon Lips from the orthopedic service. - Given soft blood continue PRN Tylenol.  -Avoid NSAIDs given active bleeding.  -Patient is under the process of being evaluated for hyaluronic acid injections by orthopedics.  -Add tramadol PRN if Tylenol does not control pain.  -After patient receives 1 unit PRBC, PT/OT eval    Code Status: FULL Family Communication: family present at time of exam Disposition Plan: Discharge in a.m. if patient's H/H remained stable    Consultants: Dr.Daniel Marye Round (GI) Dr.Jeffrey Sedonia Small (cardiology)    Procedure/Significant Events: 3/21 transfused  3 units PRBC   Culture NA  Antibiotics: NA  DVT prophylaxis: SCD   Devices NA   LINES / TUBES:  NA    Continuous Infusions: . sodium chloride 100 mL/hr at 12/14/14 0429    Objective: VITAL SIGNS: Temp: 98.7 F (37.1 C) (03/22 0428) Temp Source: Oral (03/22  0428) BP: 120/49 mmHg (03/22 0400) Pulse Rate: 72 (03/22 0156) SPO2; FIO2:   Intake/Output Summary (Last 24 hours) at 12/14/14 0859 Last data filed at 12/14/14 0429  Gross per 24 hour  Intake   1120 ml  Output    700 ml  Net    420 ml     Exam: General: A/O 4, NAD, No acute respiratory distress Lungs: Clear to auscultation bilaterally without wheezes or crackles Cardiovascular: Regular rate and rhythm without murmur gallop or rub normal S1 and S2 Abdomen: Nontender, nondistended, soft, bowel sounds positive, no rebound, no ascites, no appreciable mass Extremities: No significant cyanosis, clubbing, or edema bilateral lower extremities  Data Reviewed: Basic Metabolic Panel:  Recent Labs Lab 12/13/14 1440 12/14/14 0554  NA 137 141  K 4.2 3.8  CL 107 112  CO2 23 25  GLUCOSE 126* 98  BUN 36* 28*  CREATININE 1.16 1.02  CALCIUM 8.0* 7.7*   Liver Function Tests: No results for input(s): AST, ALT, ALKPHOS, BILITOT, PROT, ALBUMIN in the last 168 hours. No results for input(s): LIPASE, AMYLASE in the last 168 hours. No results for input(s): AMMONIA in the last 168 hours. CBC:  Recent Labs Lab 12/13/14 1440 12/14/14 0554  WBC 9.2 7.9  HGB 4.2* 6.6*  HCT 12.7* 19.5*  MCV 94.8 91.1  PLT 110* 92*   Cardiac Enzymes: No results for input(s): CKTOTAL, CKMB, CKMBINDEX, TROPONINI in the last 168 hours. BNP (last 3 results) No results for input(s): BNP in the last 8760 hours.  ProBNP (last 3 results) No results for input(s): PROBNP in the last 8760 hours.  CBG: No results for input(s): GLUCAP in the last 168 hours.  Recent Results (from the past 240 hour(s))  MRSA PCR Screening     Status: None   Collection Time: 12/13/14  6:05 PM  Result Value Ref Range Status   MRSA by PCR NEGATIVE NEGATIVE Final    Comment:        The GeneXpert MRSA Assay (FDA approved for NASAL specimens only), is one component of a comprehensive MRSA colonization surveillance program. It  is not intended to diagnose MRSA infection nor to guide or monitor treatment for MRSA infections.      Studies:  Recent x-ray studies have been reviewed in detail by the Attending Physician  Scheduled Meds:  Scheduled Meds: . sodium chloride   Intravenous Once  . allopurinol  300 mg Oral Daily  . montelukast  10 mg Oral QHS  . niacin  1,500 mg Oral QHS  . pantoprazole  40 mg Oral BID  . sodium chloride  3 mL Intravenous Q12H    Time spent on care of this patient: 40 mins   WOODS, Roselind MessierURTIS J , MD  [facility] Office  [phone] Pager - [phone]  On-Call/Text Page:      [email or web address]      password [email or web address]  If 7PM-7AM, please contact night-coverage [email or web address] Password [email or web address] 12/14/2014, 8:59 AM   LOS: 1 day   Care during the described time interval was provided by me .  I have reviewed this patient's available data, including medical history, events of note, physical examination, radiology studies and test  results as part of my evaluation  Carolyne Littles, MD [phone] Pager

## 2014-12-14 NOTE — Care Management Note (Signed)
    Page 1 of 1   12/16/2014     6:28:09 PM CARE MANAGEMENT NOTE 12/16/2014  Patient:  Eulah CitizenBECTON,Surya   Account Number:  [id number]  Date Initiated:  12/14/2014  Documentation initiated by:  Gae GallopOLE,ANGELA  Subjective/Objective Assessment:   Pt from home admitted with gi bleed.     Action/Plan:   Return to home when medically stable. CM to f/u with d/ needs.   Anticipated DC Date:  12/16/2014   Anticipated DC Plan:  SKILLED NURSING FACILITY  In-house referral  Clinical Social Worker      DC Planning Services  CM consult      Choice offered to / List presented to:             Status of service:  Completed, signed off Medicare Important Message given?  YES (If response is "NO", the following Medicare IM given date fields will be blank) Date Medicare IM given:  12/16/2014 Medicare IM given by:  Letha CapeAYLOR,Sharmeka Palmisano Date Additional Medicare IM given:   Additional Medicare IM given by:    Discharge Disposition:  SKILLED NURSING FACILITY  Per UR Regulation:  Reviewed for med. necessity/level of care/duration of stay  If discussed at Long Length of Stay Meetings, dates discussed:    Comments:  12/16/14 1827 Letha CapeDeborah Paxtyn Wisdom RN [id number] [phone] [facility]

## 2014-12-14 NOTE — Progress Notes (Signed)
[facility] Progress Note    Since last GI note: NO overt bleeding (No BMs, no hematemesis) since admission here yesterday afternoon.  He received 3 units PRBC.  Cardiology input noted, appreciated.  Objective: Vital signs in last 24 hours: Temp:  [97.1 F (36.2 C)-99.5 F (37.5 C)] 98.7 F (37.1 C) (03/22 0428) Pulse Rate:  [71-85] 72 (03/22 0156) Resp:  [13-25] 21 (03/22 0400) BP: (83-120)/(36-52) 120/49 mmHg (03/22 0400) SpO2:  [95 %-100 %] 95 % (03/22 0400) Weight:  [158 lb 8.2 oz (71.9 kg)] 158 lb 8.2 oz (71.9 kg) (03/21 1746) Last BM Date: 12/13/14 General: alert and oriented times 3 Heart: regular rate and rythm Abdomen: soft, non-tender, non-distended, normal bowel sounds   Lab Results:  Recent Labs  12/13/14 1440 12/14/14 0554  WBC 9.2 7.9  HGB 4.2* 6.6*  PLT 110* 92*  MCV 94.8 91.1    Recent Labs  12/13/14 1440 12/14/14 0554  NA 137 141  K 4.2 3.8  CL 107 112  CO2 23 25  GLUCOSE 126* 98  BUN 36* 28*  CREATININE 1.16 1.02  CALCIUM 8.0* 7.7*    Medications: Scheduled Meds: . sodium chloride   Intravenous Once  . allopurinol  300 mg Oral Daily  . montelukast  10 mg Oral QHS  . niacin  1,500 mg Oral QHS  . pantoprazole  40 mg Oral BID  . sodium chloride  3 mL Intravenous Q12H   Continuous Infusions: . sodium chloride 100 mL/hr at 12/14/14 0429   PRN Meds:.acetaminophen **OR** acetaminophen, albuterol, guaiFENesin-dextromethorphan, ondansetron **OR** ondansetron (ZOFRAN) IV    Assessment/Plan: 81 y.o. male with recurrent overt, obscure GI bleeding in setting of chronic blood thinner  Currently no plans for repeat GI workup since he had EGD, colonoscopy, capsule last year here.  I agree with holding his eliquis indefinitely.  He and his daughter in room agree with this, understand that there is a small but real risk for CVA off blood thinner. Fortunately he's not been in afib since he moved to [place] 1 year ago (never seen on EKG,  rhythm strips and he never feels fluttering in his chest), so perhaps he will remain in sinus.  OK to advance diet as tolerated,  Agree with rechecking CBD and if Hb <7 to transfuse 1-2 more units of blood. As long as no further active bleeding, he is OK to d/c tomorrow. We will make follow up appt in my office in 6-7 weeks, will have cbc checked the day prior. He should be sent home on once daily iron supplement.   Rachael FeeJacobs, Aarron Wierzbicki P, MD  12/14/2014, 8:35 AM [facility] Pager [phone]

## 2014-12-14 NOTE — Progress Notes (Signed)
UR COMPLETED  

## 2014-12-14 NOTE — Progress Notes (Signed)
Nutrition Brief Note  Patient identified on the Malnutrition Screening Tool (MST) Report  Wt Readings from Last 15 Encounters:  12/13/14 158 lb 8.2 oz (71.9 kg)  11/17/14 161 lb (73.029 kg)  05/11/14 152 lb (68.947 kg)  02/16/14 155 lb (70.308 kg)  12/23/13 147 lb 3.2 oz (66.769 kg)    Body mass index is 22.74 kg/(m^2). Patient meets criteria for normal based on current BMI. Weight has been stable.  Current diet order is heart, patient is consuming approximately 90-100% of meals at this time. Pt reports having a good appetite currently and PTA eating 3 full meals a day with no other difficulties. Labs and medications reviewed.   No nutrition interventions warranted at this time. If nutrition issues arise, please consult RD.   Marijean NiemannStephanie La, MS, RD, LDN Pager # [phone] After hours/ weekend pager # [phone]

## 2014-12-15 LAB — TYPE AND SCREEN
ABO/RH(D): O POS
Antibody Screen: NEGATIVE
UNIT DIVISION: 0
UNIT DIVISION: 0
Unit division: 0
Unit division: 0
Unit division: 0

## 2014-12-15 MED ORDER — DILTIAZEM HCL ER COATED BEADS 120 MG PO CP24
120.0000 mg | ORAL_CAPSULE | Freq: Every day | ORAL | Status: DC
  Administered 2014-12-16: 120 mg via ORAL
  Filled 2014-12-15: qty 1

## 2014-12-15 MED ORDER — CYANOCOBALAMIN 1000 MCG/ML IJ SOLN
1000.0000 ug | Freq: Once | INTRAMUSCULAR | Status: AC
  Administered 2014-12-15: 1000 ug via SUBCUTANEOUS
  Filled 2014-12-15: qty 1

## 2014-12-15 MED ORDER — TRAMADOL HCL 50 MG PO TABS: 50.0000 mg | ORAL_TABLET | Freq: Four times a day (QID) | ORAL | Status: DC | PRN

## 2014-12-15 MED ORDER — ALFUZOSIN HCL ER 10 MG PO TB24
10.0000 mg | ORAL_TABLET | Freq: Every day | ORAL | Status: DC
  Administered 2014-12-16: 10 mg via ORAL
  Filled 2014-12-15 (×2): qty 1

## 2014-12-15 MED ORDER — FINASTERIDE 5 MG PO TABS
5.0000 mg | ORAL_TABLET | Freq: Every day | ORAL | Status: DC
  Administered 2014-12-15 – 2014-12-16 (×2): 5 mg via ORAL
  Filled 2014-12-15 (×2): qty 1

## 2014-12-15 MED ORDER — FERROUS FUMARATE 325 (106 FE) MG PO TABS
1.0000 | ORAL_TABLET | Freq: Every day | ORAL | Status: DC
  Administered 2014-12-15 – 2014-12-16 (×2): 106 mg via ORAL
  Filled 2014-12-15 (×3): qty 1

## 2014-12-15 MED ORDER — ACETAMINOPHEN 325 MG PO TABS: 650.0000 mg | ORAL_TABLET | Freq: Four times a day (QID) | ORAL | Status: DC | PRN

## 2014-12-15 MED ORDER — FERROUS FUMARATE 325 (106 FE) MG PO TABS: 1.0000 | ORAL_TABLET | Freq: Every day | ORAL | Status: DC

## 2014-12-15 MED ORDER — SENNOSIDES-DOCUSATE SODIUM 8.6-50 MG PO TABS
1.0000 | ORAL_TABLET | Freq: Two times a day (BID) | ORAL | Status: DC
  Administered 2014-12-16: 1 via ORAL
  Filled 2014-12-15 (×2): qty 1

## 2014-12-15 MED ORDER — ADULT MULTIVITAMIN W/MINERALS CH
1.0000 | ORAL_TABLET | Freq: Every day | ORAL | Status: DC
  Administered 2014-12-15 – 2014-12-16 (×2): 1 via ORAL
  Filled 2014-12-15 (×2): qty 1

## 2014-12-15 MED ORDER — NIACIN ER 500 MG PO CPCR
1500.0000 mg | ORAL_CAPSULE | Freq: Every day | ORAL | Status: DC
  Filled 2014-12-15 (×2): qty 3

## 2014-12-15 MED ORDER — DILTIAZEM HCL ER COATED BEADS 120 MG PO CP24: 120.0000 mg | ORAL_CAPSULE | Freq: Every day | ORAL | Status: DC

## 2014-12-15 MED ORDER — LEVALBUTEROL TARTRATE 45 MCG/ACT IN AERO
1.0000 | INHALATION_SPRAY | RESPIRATORY_TRACT | Status: DC | PRN
  Filled 2014-12-15: qty 15

## 2014-12-15 MED ORDER — PANTOPRAZOLE SODIUM 40 MG PO TBEC
40.0000 mg | DELAYED_RELEASE_TABLET | Freq: Every day | ORAL | Status: DC
  Administered 2014-12-15 – 2014-12-16 (×2): 40 mg via ORAL
  Filled 2014-12-15: qty 1

## 2014-12-15 NOTE — Progress Notes (Signed)
Report received from rosemary RN from 3S for patient to be transferred into 5w10

## 2014-12-15 NOTE — Progress Notes (Signed)
OT Cancellation Note  Patient Details Name: [name] MRN: [id number] DOB: April 02, 1932   Cancelled Treatment:    Reason Eval/Treat Not Completed: Other (comment). Per PT, pt plans to d/c to SNF at [facility]. Pt has  Medicare and current D/C plan is SNF. No apparent immediate acute care OT needs, therefore will defer OT to SNF. If OT eval is needed please call Acute Rehab Dept. at [phone] or text page OT at [phone].    Nena JordanMiller, Anndee Connett M 12/15/2014, 3:38 PM

## 2014-12-15 NOTE — Progress Notes (Signed)
Report called to RN, [address], all questions answered.  Family at bedside.

## 2014-12-15 NOTE — Evaluation (Signed)
Physical Therapy Evaluation Patient Details Name: Kristopher [name] MRN: [id number] DOB: 01-21-1932 Today's Date: 12/15/2014   History of Present Illness  Pt admitted 12/13/14 for GI bleed. Pt with severe osteoarthritis  Clinical Impression  Pt admitted with above. Pt presenting with generalized deconditioning, decreased endurance and balance impairment, requiring assist for all mobility at this time. Pt to benefit from ST-SNF upon d/c to achieve safe mod independent level of function for safe transition back to ILF.    Follow Up Recommendations SNF;Supervision/Assistance - 24 hour    Equipment Recommendations  None recommended by PT    Recommendations for Other Services       Precautions / Restrictions Precautions Precautions: Fall Restrictions Weight Bearing Restrictions: No      Mobility  Bed Mobility Overal bed mobility: Needs Assistance Bed Mobility: Supine to Sit     Supine to sit: Mod assist;Max assist     General bed mobility comments: assist for trunk elevation, pt able to move LEs indep.  Transfers Overall transfer level: Needs assistance Equipment used: Rolling walker (2 wheeled) Transfers: Sit to/from Stand Sit to Stand: Min assist         General transfer comment: pt required increased time but demo'd safe technique  Ambulation/Gait Ambulation/Gait assistance: Min assist Ambulation Distance (Feet): 60 Feet Assistive device: Rolling walker (2 wheeled) Gait Pattern/deviations: Step-through pattern;Decreased stride length;Trunk flexed;Narrow base of support Gait velocity: slow   General Gait Details: pt fatigued quickly, pt with L LE limp and decreased L LE WBing due to pain  Stairs            Wheelchair Mobility    Modified Rankin (Stroke Patients Only)       Balance Overall balance assessment: Needs assistance Sitting-balance support: Feet supported Sitting balance-Leahy Scale: Fair Sitting balance - Comments: sat with min guard x 3  min   Standing balance support: Bilateral upper extremity supported Standing balance-Leahy Scale: Poor Standing balance comment: needs RW for safe standing                             Pertinent Vitals/Pain Pain Assessment: Faces Faces Pain Scale: Hurts even more Pain Location: L hip/knee Pain Intervention(s): Monitored during session    Home Living Family/patient expects to be discharged to:: Skilled nursing facility                      Prior Function Level of Independence: Independent with assistive device(s)         Comments: pt used RW and cane, pt drived. ILF provided meals     Hand Dominance        Extremity/Trunk Assessment   Upper Extremity Assessment: Overall WFL for tasks assessed           Lower Extremity Assessment: LLE deficits/detail;RLE deficits/detail RLE Deficits / Details: grossly 3/5 LLE Deficits / Details: grossly 3/5  Cervical / Trunk Assessment: Normal  Communication   Communication: No difficulties  Cognition Arousal/Alertness: Awake/alert Behavior During Therapy: WFL for tasks assessed/performed Overall Cognitive Status: Within Functional Limits for tasks assessed                      General Comments      Exercises        Assessment/Plan    PT Assessment Patient needs continued PT services  PT Diagnosis Difficulty walking   PT Problem List Decreased strength;Decreased range of motion;Decreased activity tolerance;Decreased balance;Decreased  mobility  PT Treatment Interventions DME instruction;Gait training;Stair training;Functional mobility training;Therapeutic activities;Therapeutic exercise   PT Goals (Current goals can be found in the Care Plan section) Acute Rehab PT Goals Patient Stated Goal: walk PT Goal Formulation: With patient/family Time For Goal Achievement: 12/29/14 Potential to Achieve Goals: Good    Frequency Min 2X/week   Barriers to discharge Decreased caregiver support pt  lives alone    Co-evaluation               End of Session Equipment Utilized During Treatment: Gait belt Activity Tolerance: Patient tolerated treatment well Patient left: in chair;with call bell/phone within reach;with family/visitor present Nurse Communication: Mobility status         Time: 4098-1191 PT Time Calculation (min) (ACUTE ONLY): 23 min   Charges:   PT Evaluation $Initial PT Evaluation Tier I: 1 Procedure PT Treatments $Gait Training: 8-22 mins   PT G CodesMarcene Brawn 12/15/2014, 4:01 PM   Lewis Shock, PT, DPT Pager #: [phone] Office #: [phone]

## 2014-12-15 NOTE — Discharge Instructions (Signed)
Bloody Stools  Bloody stools often mean that there is a problem in the digestive tract. Your caregiver may use the term "melena" to describe black, tarry, and bad smelling stools or "hematochezia" to describe red or maroon-colored stools. Blood seen in the stool can be caused by bleeding anywhere along the intestinal tract.   A black stool usually means that blood is coming from the upper part of the gastrointestinal tract (esophagus, stomach, or small bowel). Passing maroon-colored stools or bright red blood usually means that blood is coming from lower down in the large bowel or the rectum. However, sometimes massive bleeding in the stomach or small intestine can cause bright red bloody stools.   Consuming black licorice, lead, iron pills, medicines containing bismuth subsalicylate, or blueberries can also cause black stools. Your caregiver can test black stools to see if blood is present.  It is important that the cause of the bleeding be found. Treatment can then be started, and the problem can be corrected. Rectal bleeding may not be serious, but you should not assume everything is okay until you know the cause. It is very important to follow up with your caregiver or a specialist in gastrointestinal problems.  CAUSES   Blood in the stools can come from various underlying causes. Often, the cause is not found during your first visit. Testing is often needed to discover the cause of bleeding in the gastrointestinal tract. Causes range from simple to serious or even life-threatening. Possible causes include:  · Hemorrhoids. These are veins that are full of blood (engorged) in the rectum. They cause pain, inflammation, and may bleed.  · Anal fissures. These are areas of painful tearing which may bleed. They are often caused by passing hard stool.  · Diverticulosis. These are pouches that form on the colon over time, with age, and may bleed significantly.  · Diverticulitis. This is inflammation in areas with  diverticulosis. It can cause pain, fever, and bloody stools, although bleeding is rare.  · Proctitis and colitis. These are inflamed areas of the rectum or colon. They may cause pain, fever, and bloody stools.  · Polyps and cancer. Colon cancer is a leading cause of preventable cancer death. It often starts out as precancerous polyps that can be removed during a colonoscopy, preventing progression into cancer. Sometimes, polyps and cancer may cause rectal bleeding.  · Gastritis and ulcers. Bleeding from the upper gastrointestinal tract (near the stomach) may travel through the intestines and produce black, sometimes tarry, often bad smelling stools. In certain cases, if the bleeding is fast enough, the stools may not be black, but red and the condition may be life-threatening.  SYMPTOMS   You may have stools that are bright red and bloody, that are normal color with blood on them, or that are dark black and tarry. In some cases, you may only have blood in the toilet bowl. Any of these cases need medical care. You may also have:  · Pain at the anus or anywhere in the rectum.  · Lightheadedness or feeling faint.  · Extreme weakness.  · Nausea or vomiting.  · Fever.  DIAGNOSIS  Your caregiver may use the following methods to find the cause of your bleeding:  · Taking a medical history. Age is important. Older people tend to develop polyps and cancer more often. If there is anal pain and a hard, large stool associated with bleeding, a tear of the anus may be the cause. If blood drips into the toilet after a bowel movement, bleeding hemorrhoids may be the   problem. The color and frequency of the bleeding are additional considerations. In most cases, the medical history provides clues, but seldom the final answer.  · A visual and finger (digital) exam. Your caregiver will inspect the anal area, looking for tears and hemorrhoids. A finger exam can provide information when there is tenderness or a growth inside. In men, the  prostate is also examined.  · Endoscopy. Several types of small, long scopes (endoscopes) are used to view the colon.  ¨ In the office, your caregiver may use a rigid, or more commonly, a flexible viewing sigmoidoscope. This exam is called flexible sigmoidoscopy. It is performed in 5 to 10 minutes.  ¨ A more thorough exam is accomplished with a colonoscope. It allows your caregiver to view the entire 5 to 6 foot long colon. Medicine to help you relax (sedative) is usually given for this exam. Frequently, a bleeding lesion may be present beyond the reach of the sigmoidoscope. So, a colonoscopy may be the best exam to start with. Both exams are usually done on an outpatient basis. This means the patient does not stay overnight in the hospital or surgery center.  ¨ An upper endoscopy may be needed to examine your stomach. Sedation is used and a flexible endoscope is put in your mouth, down to your stomach.  · A barium enema X-ray. This is an X-ray exam. It uses liquid barium inserted by enema into the rectum. This test alone may not identify an actual bleeding point. X-rays highlight abnormal shadows, such as those made by lumps (tumors), diverticuli, or colitis.  TREATMENT   Treatment depends on the cause of your bleeding.   · For bleeding from the stomach or colon, the caregiver doing your endoscopy or colonoscopy may be able to stop the bleeding as part of the procedure.  · Inflammation or infection of the colon can be treated with medicines.  · Many rectal problems can be treated with creams, suppositories, or warm baths.  · Surgery is sometimes needed.  · Blood transfusions are sometimes needed if you have lost a lot of blood.  · For any bleeding problem, let your caregiver know if you take aspirin or other blood thinners regularly.  HOME CARE INSTRUCTIONS   · Take any medicines exactly as prescribed.  · Keep your stools soft by eating a diet high in fiber. Prunes (1 to 3 a day) work well for many people.  · Drink  enough water and fluids to keep your urine clear or pale yellow.  · Take sitz baths if advised. A sitz bath is when you sit in a bathtub with warm water for 10 to 15 minutes to soak, soothe, and cleanse the rectal area.  · If enemas or suppositories are advised, be sure you know how to use them. Tell your caregiver if you have problems with this.  · Monitor your bowel movements to look for signs of improvement or worsening.  SEEK MEDICAL CARE IF:   · You do not improve in the time expected.  · Your condition worsens after initial improvement.  · You develop any new symptoms.  SEEK IMMEDIATE MEDICAL CARE IF:   · You develop severe or prolonged rectal bleeding.  · You vomit blood.  · You feel weak or faint.  · You have a fever.  MAKE SURE YOU:  · Understand these instructions.  · Will watch your condition.  · Will get help right away if you are not doing well or get worse.    Document Released: 08/31/2002 Document Revised: 12/03/2011 Document Reviewed: 01/26/2011  ExitCare® Patient Information ©2015 [employer]. This information is not intended to replace advice given to you by your health care provider. Make sure you discuss any questions you have with your health care provider.

## 2014-12-15 NOTE — Progress Notes (Signed)
          Daily Rounding Note  12/15/2014, 8:39 AM  LOS: 2 days   SUBJECTIVE:       Confused overnight.  Thought his daughter was kidnapped when unable to reach her last night.  Tearful today in trying to explain this  OBJECTIVE:         Vital signs in last 24 hours:    Temp:  [97.9 F (36.6 C)-99.1 F (37.3 C)] 98.2 F (36.8 C) (03/23 0700) Pulse Rate:  [73-90] 78 (03/23 0300) Resp:  [14-23] 17 (03/23 0300) BP: (90-141)/(41-100) 110/61 mmHg (03/23 0300) SpO2:  [94 %-100 %] 94 % (03/23 0300) Last BM Date: 12/14/14 Filed Weights   12/13/14 1746  Weight: 158 lb 8.2 oz (71.9 kg)   General: comfortable, tearful at times   Heart: RRR Chest: some rales in left base, some coughing.  No dyspnea Abdomen: soft, NT, ND.  BS hypoactive.   Extremities: no CCE Neuro/Psych:  Cooperative, slow/halting/deliberate but accurate and appropriate speech.  Tearful at times  Intake/Output from previous day: 03/22 0701 - 03/23 0700 In: 2017.3 [I.V.:1682.3; Blood:335] Out: 1550 [Urine:1550]  Intake/Output this shift:    Lab Results:  Recent Labs  12/13/14 1440 12/14/14 0554 12/14/14 0856 12/14/14 1624  WBC 9.2 7.9  --  7.4  HGB 4.2* 6.6* 7.1* 7.8*  HCT 12.7* 19.5* 20.9* 22.7*  PLT 110* 92*  --  107*   BMET  Recent Labs  12/13/14 1440 12/14/14 0554  NA 137 141  K 4.2 3.8  CL 107 112  CO2 23 25  GLUCOSE 126* 98  BUN 36* 28*  CREATININE 1.16 1.02  CALCIUM 8.0* 7.7*   LFT  Recent Labs  12/14/14 0856  ALBUMIN 2.6*   PT/INR  Recent Labs  12/14/14 0856  LABPROT 14.6  INR 1.12   Hepatitis Panel No results for input(s): HEPBSAG, HCVAB, HEPAIGM, HEPBIGM in the last 72 hours.  Studies/Results: No results found.  ASSESMENT:   *  Hematemesis in setting of Eliquis (now discontinued).    *  ABL anemia.  S/p 4 PRBCs.  Hgb much improved.   *  Hx Afib.   *  Sundowning, ? Dementia. Head CT 11/2013 with mild atrophy,  mild periventricular small vessel disease.   *  Chronic pain in knees, left hip.     PLAN   *  Resume once daily PO Iron.  May need laxative if the iron leads to constipation.  Once daily Omeprazole.  *  Ok to discharge today, has ROV with GI and pre visit lab work set up for later in 12/2014.     Jennye MoccasinSarah Gribbin  12/15/2014, 8:39 AM Pager: [phone]   ________________________________________________________________________  [facility] MD note:  I personally examined the patient, reviewed the data and agree with the assessment and plan described above.  His Hb is far from normal but should improve over next several weeks on iron (once daily).  He will return to our office in 3-4 weeks with CBD the day prior and there are no plans to ever restart his eliquis.   Rob Buntinganiel Shoni Quijas, MD [facility] Pager [phone]

## 2014-12-15 NOTE — Progress Notes (Signed)
  [place] TEAM 1 - Stepdown/ICU TEAM  D/C was planned for today.  Spoke w/ RN who informed me pt has not had a BM today.  His last BM yesterday was "large, dark red, and tarry" on 3/22.  I have decided to postpone his d/c until such time that he has another BM, so that we may assure the maroon stools have ceased.  This will also allow us to recheck a Hgb again in the morning to further assure that his bleeding has stopped.  I do feel he can be reasonably followed in a medical bed, so I will transfer him.    Kristopher BloodJeffrey T. Suriyah Vergara, MD [facility] For Consults/Admissions - Flow Manager [phone] Office  [phone]  Contact MD directly via text page:      [email or web address]      password [email or web address]

## 2014-12-15 NOTE — Discharge Summary (Addendum)
DISCHARGE SUMMARY  Kristopher [name]  MR#: [id number]  DOB:[birth year]  Date of Admission: 12/13/2014 Date of Discharge: 12/16/2014  Attending Physician:Timika Muench J  Patient's WUJ:[email or web address],NFAPCP:STONEKING,HAL Maisie FusHOMAS, MD  Consults:  [facility] + [facility]   Disposition: D/C home   Follow-up Appts:     Follow-up Information    Follow up with Mike GipAmy Esterwood, PA-C On 01/20/2015.   Specialty:  Gastroenterology   Why:  10:30 AM.  Follow up with Dr Christella HartiganJacobs PA.     Contact information:   [address] [place] [address] [phone]       Follow up with [facility] On 01/19/2015.   Why:  go to lab for blood work, the day before you go to GI office. any time between 8AM and 5 PM.    Contact information:   [address] [place] [phone]       Follow up with Ginette OttoSTONEKING,HAL THOMAS, MD. Schedule an appointment as soon as possible for a visit in 3 days.   Specialty:  Internal Medicine   Why:  Call the office to arrange for a recheck of your Hemaglobin (blood count) in 3-5 days    Contact information:   [address] [place] [address] [phone]       Follow up with Rachael FeeJacobs, Daniel P, MD. Schedule an appointment as soon as possible for a visit in 3 weeks.   Specialty:  Gastroenterology   Why:  Call to arrange appointment, follow-up GI bleed   Contact information:   [address] [place] [address] [phone]       Tests Needing Follow-up: -reasses BP and determine if CCB can be increased to former dose (240mg ) and to determine when ACE can be resumed / if needed -recheck of Hgb is suggested in 3-5 days -recheck of B12 in 8 weeks is indicated    Discharge Diagnoses: Upper GI bleed - hematemesis + melena  Acute blood loss anemia/symptomatic anemia  Hypotension / Hypovolemia due to blood loss Atrial fibrillation Osteoarthritis  Initial presentation: 81 y.o. M Hx A fib on Eliquis, HTN, CAD S/P CABG, CVA, gout, colon  polyps, prior GI bleed (hemoglobin of 4 in March 2015 from possible cecal AVMs who had been feeling weak for one week. Patient had one episode of loose stool that was bloody. There was no vomiting. Patient lives in a retirement community and was seen by a nurse and thought to have a viral syndrome. He continued to feel weak and had worsening dizziness.  He continued to worsen, and was seen at his PCPs office where blood work revealed a hemoglobin of 5. He was also found to be hypotensive and was referred to the emergency room.   In the ED the patient received 1 L of IV fluid with blood pressure stabilizing in the low 100 systolic range. On rectal exam he was found to have black/bloody stools.   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 -transfused a total of 4 units PRBC -no endoscoopy this admit - had EGD, colo, and capsule study in 2015 -Continue Protonix 40 mg BID -Counseled patient and family he can never be on anticoagulants again  -Patient is to see Dr. Rob Buntinganiel Jacobs ([facility]) in 3-4 weeks   Acute blood loss anemia / symptomatic anemia -See GI bleed  -Hgb stable/climbing at time of  d/c w/ d/c Hgb 7.8  Hypotension / Hypovolemia due to blood loss -resolved w/ volume resuscitation and transfusions -held all antihypertensive medicationsth/o hospital stay -at time of d/c CCB being resumed at half usual dose - consider returning to prior full dose in f/u if BP supports  Atrial fibrillation -Currently in NSR - per Cardiology note has been in NSR for significant amount time -Patient and daughter counseled that patient would never restart anticoagulation  Osteoarthritis -Patient apparently has severe osteoarthritis of bilateral knees and left hip. He is being followed by Dr. Madelon Lips from the orthopedic service -Avoid NSAIDs given active  bleeding -Patient is under the process of being evaluated for hyaluronic acid injections by orthopedics -Add tramadol PRN if Tylenol does not control pain   Borderline B12  -gave SQ x1 prior to d/c - recheck in 8 weeks to determine if further supplementation needed   Code Status: FULL    Medication List    STOP taking these medications        acetaminophen 650 MG CR tablet  Commonly known as:  TYLENOL  Replaced by:  acetaminophen 325 MG tablet     apixaban 5 MG Tabs tablet  Commonly known as:  ELIQUIS     omeprazole 40 MG capsule  Commonly known as:  PRILOSEC     Potassium 99 MG Tabs     ramipril 5 MG capsule  Commonly known as:  ALTACE      TAKE these medications        acetaminophen 325 MG tablet  Commonly known as:  TYLENOL  Take 2 tablets (650 mg total) by mouth every 6 (six) hours as needed for mild pain (or Fever >/= 101).     alfuzosin 10 MG 24 hr tablet  Commonly known as:  UROXATRAL  Take 10 mg by mouth daily with breakfast.     allopurinol 300 MG tablet  Commonly known as:  ZYLOPRIM  Take 300 mg by mouth daily.     CALCIUM PO  Take by mouth.     diltiazem 120 MG 24 hr capsule  Commonly known as:  CARDIZEM CD  Take 1 capsule (120 mg total) by mouth daily.     ferrous fumarate 325 (106 FE) MG Tabs tablet  Commonly known as:  HEMOCYTE - 106 mg FE  Take 1 tablet by mouth daily.     finasteride 5 MG tablet  Commonly known as:  PROSCAR  Take 5 mg by mouth daily.     FLAXSEED OIL MAX STR 1300 MG Caps  Take by mouth.     levalbuterol 45 MCG/ACT inhaler  Commonly known as:  XOPENEX HFA  Inhale 1-2 puffs into the lungs every 4 (four) hours as needed for wheezing.     montelukast 10 MG tablet  Commonly known as:  SINGULAIR  Take 10 mg by mouth daily.     multivitamin with minerals Tabs tablet  Take 1 tablet by mouth daily.     niacin 750 MG CR tablet  Commonly known as:  NIASPAN  Take 1,500 mg by mouth at bedtime.     pantoprazole  40 MG tablet  Commonly known as:  PROTONIX  Take 1 tablet (40 mg total) by mouth 2 (two) times daily.        Day of Discharge BP 123/69 mmHg  Pulse 81  Temp(Src) 98.1 F (36.7 C) (Oral)  Resp 18  Ht  (1.778 m)  Wt 71.9 kg (158 lb 8.2 oz)  BMI 22.74 kg/m2  SpO2 100%  Physical Exam: General: No acute respiratory distress Lungs: Clear to auscultation bilaterally without wheezes or crackles Cardiovascular: Regular rate and rhythm  Abdomen: Nontender, nondistended, soft, bowel sounds positive, no rebound, no ascites, no appreciable mass Extremities: No significant cyanosis, clubbing, or edema bilateral lower extremities  Basic Metabolic Panel:  Recent Labs Lab 12/13/14 1440 12/14/14 0554  NA 137 141  K 4.2 3.8  CL 107 112  CO2 23 25  GLUCOSE 126* 98  BUN 36* 28*  CREATININE 1.16 1.02  CALCIUM 8.0* 7.7*    Liver Function Tests:  Recent Labs Lab 12/14/14 0856  ALBUMIN 2.6*   Coags:  Recent Labs Lab 12/14/14 0856  INR 1.12   CBC:  Recent Labs Lab 12/13/14 1440 12/14/14 0554 12/14/14 0856 12/14/14 1624 12/16/14 0712  WBC 9.2 7.9  --  7.4 5.6  HGB 4.2* 6.6* 7.1* 7.8* 7.2*  HCT 12.7* 19.5* 20.9* 22.7* 21.6*  MCV 94.8 91.1  --  90.1 92.7  PLT 110* 92*  --  107* 121*    Recent Results (from the past 240 hour(s))  MRSA PCR Screening     Status: None   Collection Time: 12/13/14  6:05 PM  Result Value Ref Range Status   MRSA by PCR NEGATIVE NEGATIVE Final    Comment:        The GeneXpert MRSA Assay (FDA approved for NASAL specimens only), is one component of a comprehensive MRSA colonization surveillance program. It is not intended to diagnose MRSA infection nor to guide or monitor treatment for MRSA infections.     Time spent in discharge (includes decision making & examination of pt): >35 minutes  12/16/2014, 11:01 AM    Carolyne Littles, MD [facility] [phone] pager Office  [phone]   On-Call/Text Page:       [email or web address]      password [facility]

## 2014-12-16 DIAGNOSIS — M158 Other polyosteoarthritis: Secondary | ICD-10-CM

## 2014-12-16 DIAGNOSIS — K922 Gastrointestinal hemorrhage, unspecified: Secondary | ICD-10-CM | POA: Diagnosis present

## 2014-12-16 DIAGNOSIS — K921 Melena: Principal | ICD-10-CM

## 2014-12-16 LAB — CBC
HCT: 21.6 % — ABNORMAL LOW (ref 39.0–52.0)
HEMOGLOBIN: 7.2 g/dL — AB (ref 13.0–17.0)
MCH: 30.9 pg (ref 26.0–34.0)
MCHC: 33.3 g/dL (ref 30.0–36.0)
MCV: 92.7 fL (ref 78.0–100.0)
Platelets: 121 10*3/uL — ABNORMAL LOW (ref 150–400)
RBC: 2.33 MIL/uL — ABNORMAL LOW (ref 4.22–5.81)
RDW: 17 % — ABNORMAL HIGH (ref 11.5–15.5)
WBC: 5.6 10*3/uL (ref 4.0–10.5)

## 2014-12-16 MED ORDER — PANTOPRAZOLE SODIUM 40 MG PO TBEC: 40.0000 mg | DELAYED_RELEASE_TABLET | Freq: Two times a day (BID) | ORAL | Status: DC

## 2014-12-16 NOTE — Clinical Social Work Note (Signed)
Patient was to be discharged yesterday, then transferred to 5W. CSW unable to complete full assessment with patient and daughter at this time. CSW did meet with both Patient and daughter and both confirm plan for return to [facility] at discharge. Patient lives in IDL at facility but will be going to the SNF until he is able to return to his IDL. Patient and daughter were both pleasant and appreciative of CSW assistance.   Roddie McBryant Tomie Spizzirri MSW, [place]CSWA, [place], [phone]

## 2014-12-16 NOTE — Progress Notes (Signed)
Medicare Important Message given?  YES (If response is "NO", the following Medicare IM given date fields will be blank) Date Medicare IM given:  12/16/14 Medicare IM given by:  Isayah Ignasiak 

## 2014-12-16 NOTE — Progress Notes (Signed)
Pt prepared for d/c to SNF. IV d/c'd. Skin intact except as most recently charted. Vitals are stable. Report called to receiving facility. Pt to be transported by daughter to facility.

## 2014-12-16 NOTE — Clinical Social Work Note (Signed)
Per MD patient ready to DC back to [facility]. RN, patient/family (daughter [name]), and facility notified of patient's DC. RN given number for report. DC packet on patient's chart and RN instructed to give to family at discharge. Patient's daughter will transport by personal vehicle. CSW signing off at this time.   Roddie McBryant Kaleb Linquist MSW, [place]CSWA, [place], [phone]

## 2015-01-20 ENCOUNTER — Ambulatory Visit (INDEPENDENT_AMBULATORY_CARE_PROVIDER_SITE_OTHER): Payer: Medicare Other | Admitting: Physician Assistant

## 2015-01-20 ENCOUNTER — Encounter: Payer: Self-pay | Admitting: Physician Assistant

## 2015-01-20 VITALS — BP 116/58 | HR 76 | Ht 70.0 in | Wt 156.1 lb

## 2015-01-20 DIAGNOSIS — Q2733 Arteriovenous malformation of digestive system vessel: Secondary | ICD-10-CM

## 2015-01-20 DIAGNOSIS — D509 Iron deficiency anemia, unspecified: Secondary | ICD-10-CM | POA: Diagnosis not present

## 2015-01-20 DIAGNOSIS — K5520 Angiodysplasia of colon without hemorrhage: Secondary | ICD-10-CM

## 2015-01-20 NOTE — Progress Notes (Signed)
Patient ID: Kristopher [name], male   DOB: 1931/11/30, 81 y.o.   MRN: [id number]   Subjective:    Patient ID: Kristopher [name], male    DOB: 1931/11/30, 81 y.o.   MRN: [id number]  HPI Kristopher [name] is a very nice 81 year old white male known to Dr. Christella HartiganJacobs. He has multiple medical problems including history of atrial fibrillation, coronary artery disease for which she is status post CABG, history of remote CVA, severe osteoarthritis. He had been maintained on eliquis. Patient comes to the office today for post hospital follow-up after a hospitalization 12/13/2014 with severe symptomatic anemia and hemoglobin of 4.2. He was found to be iron deficient with serum iron of 15 TIBC 216 and iron saturation of 7 and Hemoccult-positive. He was transfused and had a hemoglobin of 7.2 on discharge 12/16/2014. He was seen in consultation by Dr. Christella HartiganJacobs during that admission and decision was made not to pursue further endoscopic workup but it was suggested due to his history of chronic GI blood loss that anticoagulation be stopped. He was also seen in consultation by cardiologist who agreed and eliquis has been discontinued. Patient has history of an acute GI bleed in 2015 which was felt secondary to AVMs. He had capsule endoscopy at that time which showed ulcerations and edema in the small bowel likely ischemic and AVMs. Since discharge from the hospital he's been having a lot of problems with arthritis. He continues to complain of weakness but otherwise has been feeling fine. He has no complaints of abdominal discomfort and has been paying attention to his stools which she says have not been black. An iron supplement. He saw Dr. Pete GlatterStoneking yesterday for follow-up and had labs.  labs from yesterday hemoglobin is up to 9.6 hematocrit of 29.8.  Review of Systems Pertinent positive and negative review of systems were noted in the above HPI section.  All other review of systems was otherwise negative.  Outpatient Encounter Prescriptions as  of 01/20/2015  Medication Sig  . acetaminophen (TYLENOL) 325 MG tablet Take 2 tablets (650 mg total) by mouth every 6 (six) hours as needed for mild pain (or Fever >/= 101).  Marland Kitchen. alfuzosin (UROXATRAL) 10 MG 24 hr tablet Take 10 mg by mouth daily with breakfast.  . allopurinol (ZYLOPRIM) 300 MG tablet Take 300 mg by mouth daily.  Marland Kitchen. CALCIUM PO Take by mouth.  . diltiazem (CARDIZEM CD) 120 MG 24 hr capsule Take 1 capsule (120 mg total) by mouth daily.  . ferrous fumarate (HEMOCYTE - 106 MG FE) 325 (106 FE) MG TABS tablet Take 1 tablet by mouth daily.  . finasteride (PROSCAR) 5 MG tablet Take 5 mg by mouth daily.  . Flaxseed, Linseed, (FLAXSEED OIL MAX STR) 1300 MG CAPS Take by mouth.  . levalbuterol (XOPENEX HFA) 45 MCG/ACT inhaler Inhale 1-2 puffs into the lungs every 4 (four) hours as needed for wheezing.  . montelukast (SINGULAIR) 10 MG tablet Take 10 mg by mouth daily.  . Multiple Vitamin (MULTIVITAMIN WITH MINERALS) TABS tablet Take 1 tablet by mouth daily.  . niacin (NIASPAN) 750 MG CR tablet Take 1,500 mg by mouth at bedtime.  . pantoprazole (PROTONIX) 40 MG tablet Take 1 tablet (40 mg total) by mouth 2 (two) times daily.   Allergies  Allergen Reactions  . Codeine     unknown  . Other Other (See Comments)    All pain medications cause a hangover effect  . Sulfa Antibiotics     unknown   Patient Active Problem List  Diagnosis Date Noted  . Osteoarthritis 12/13/2014  . Paroxysmal atrial fibrillation 12/13/2014  . Systolic murmur 12/13/2014  . Atherosclerosis of native coronary artery of native heart without angina pectoris 05/11/2014  . History of coronary artery bypass graft 05/11/2014  . History of GI bleed 05/11/2014  . Chronic anticoagulation 05/11/2014  . Left leg pain 05/11/2014   History   Social History  . Marital Status: Widowed    Spouse Name: N/A  . Number of Children: N/A  . Years of Education: N/A   Occupational History  . Not on file.   Social History Main  Topics  . Smoking status: Never Smoker   . Smokeless tobacco: Never Used  . Alcohol Use: No  . Drug Use: No  . Sexual Activity: Not on file   Other Topics Concern  . Not on file   Social History Narrative   Widower   2 grown daughters   Former Freight forwarder   Moved to from [place] to [facility] assisted living 12/16/13    Mr. Howes's family history is not on file.      Objective:    Filed Vitals:   01/20/15 1043  BP: 116/58  Pulse: 76    Physical Exam   well-developed elderly white male in no acute distress, accompanied by his daughter patient is in a wheelchair blood pressure 116/58 pulse 76 height 5 foot 10 weight 156. 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 This was being exacerbated by chronic anticoagulation which has now been discontinued. #2 probable segment of ischemic small bowel 2015 with GI bleed #3 hx atrial fib #4 CAD s/p CABG #5 severe arthritis  Plan; HGb is stable Will follw hgb serially over next few months to assure stability-will repeat in 2 weeks Continue protonix 40 mg daily Continue feso4 325 mg daily Follow up with Dr Christella Hartigan as needed    Amy S Esterwood PA-C 01/20/2015   Cc: Merlene Laughter, MD

## 2015-01-20 NOTE — Patient Instructions (Signed)
We will get the lab results from [facility] and call you with those results.   Please come back in 2 weeks for a repeat blood count.

## 2015-01-20 NOTE — Progress Notes (Signed)
i agree with the above note, plan 

## 2015-02-03 ENCOUNTER — Other Ambulatory Visit (INDEPENDENT_AMBULATORY_CARE_PROVIDER_SITE_OTHER): Payer: Medicare Other

## 2015-02-03 DIAGNOSIS — D509 Iron deficiency anemia, unspecified: Secondary | ICD-10-CM

## 2015-02-03 DIAGNOSIS — Q2733 Arteriovenous malformation of digestive system vessel: Secondary | ICD-10-CM | POA: Diagnosis not present

## 2015-02-03 DIAGNOSIS — K5520 Angiodysplasia of colon without hemorrhage: Secondary | ICD-10-CM

## 2015-02-03 LAB — CBC WITH DIFFERENTIAL/PLATELET
BASOS ABS: 0 10*3/uL (ref 0.0–0.1)
Basophils Relative: 0.4 % (ref 0.0–3.0)
Eosinophils Absolute: 0.1 10*3/uL (ref 0.0–0.7)
Eosinophils Relative: 2.4 % (ref 0.0–5.0)
HEMATOCRIT: 30.7 % — AB (ref 39.0–52.0)
Hemoglobin: 10.1 g/dL — ABNORMAL LOW (ref 13.0–17.0)
LYMPHS ABS: 1 10*3/uL (ref 0.7–4.0)
Lymphocytes Relative: 20.8 % (ref 12.0–46.0)
MCHC: 32.9 g/dL (ref 30.0–36.0)
MCV: 89.5 fl (ref 78.0–100.0)
Monocytes Absolute: 0.4 10*3/uL (ref 0.1–1.0)
Monocytes Relative: 7.4 % (ref 3.0–12.0)
Neutro Abs: 3.4 10*3/uL (ref 1.4–7.7)
Neutrophils Relative %: 69 % (ref 43.0–77.0)
PLATELETS: 217 10*3/uL (ref 150.0–400.0)
RBC: 3.43 Mil/uL — ABNORMAL LOW (ref 4.22–5.81)
RDW: 15.9 % — AB (ref 11.5–15.5)
WBC: 4.9 10*3/uL (ref 4.0–10.5)

## 2015-02-07 ENCOUNTER — Other Ambulatory Visit: Payer: Self-pay

## 2015-02-07 DIAGNOSIS — D62 Acute posthemorrhagic anemia: Secondary | ICD-10-CM

## 2015-02-22 ENCOUNTER — Other Ambulatory Visit (INDEPENDENT_AMBULATORY_CARE_PROVIDER_SITE_OTHER): Payer: Medicare Other

## 2015-02-22 DIAGNOSIS — D62 Acute posthemorrhagic anemia: Secondary | ICD-10-CM | POA: Diagnosis not present

## 2015-02-22 LAB — CBC WITH DIFFERENTIAL/PLATELET
BASOS PCT: 0.5 % (ref 0.0–3.0)
Basophils Absolute: 0 10*3/uL (ref 0.0–0.1)
EOS ABS: 0.1 10*3/uL (ref 0.0–0.7)
EOS PCT: 1.3 % (ref 0.0–5.0)
HCT: 34.6 % — ABNORMAL LOW (ref 39.0–52.0)
HEMOGLOBIN: 11.2 g/dL — AB (ref 13.0–17.0)
LYMPHS ABS: 1.3 10*3/uL (ref 0.7–4.0)
Lymphocytes Relative: 24.1 % (ref 12.0–46.0)
MCHC: 32.5 g/dL (ref 30.0–36.0)
MCV: 90.6 fl (ref 78.0–100.0)
Monocytes Absolute: 0.4 10*3/uL (ref 0.1–1.0)
Monocytes Relative: 6.8 % (ref 3.0–12.0)
Neutro Abs: 3.6 10*3/uL (ref 1.4–7.7)
Neutrophils Relative %: 67.3 % (ref 43.0–77.0)
Platelets: 218 10*3/uL (ref 150.0–400.0)
RBC: 3.82 Mil/uL — ABNORMAL LOW (ref 4.22–5.81)
RDW: 17 % — ABNORMAL HIGH (ref 11.5–15.5)
WBC: 5.3 10*3/uL (ref 4.0–10.5)

## 2015-03-14 ENCOUNTER — Telehealth: Payer: Self-pay | Admitting: Gastroenterology

## 2015-03-14 NOTE — Telephone Encounter (Signed)
Black tarry stools starting last night.  This morning the stool was normal.  Had rare prime rib yesterday. Pt advised appt with Shanda Bumps on 03/25/15 at 230 pm.  The daughter will call and cancel if he continues to improve and has no further episodes of black stools. Dr Christella Hartigan do you recommend any labs?

## 2015-03-15 NOTE — Telephone Encounter (Signed)
Informed daughter to tell patient if he sees any more black tarry stools to call our office before his appt with Shanda Bumps so he can have a CBC drawn in our lab. She verbalized understanding.

## 2015-03-15 NOTE — Telephone Encounter (Signed)
If he has any further dark stools he needs CBC

## 2015-03-25 ENCOUNTER — Ambulatory Visit (INDEPENDENT_AMBULATORY_CARE_PROVIDER_SITE_OTHER): Payer: Medicare Other | Admitting: Gastroenterology

## 2015-03-25 ENCOUNTER — Encounter: Payer: Self-pay | Admitting: Gastroenterology

## 2015-03-25 VITALS — BP 112/68 | HR 78

## 2015-03-25 DIAGNOSIS — Z8679 Personal history of other diseases of the circulatory system: Secondary | ICD-10-CM

## 2015-03-25 DIAGNOSIS — Z8774 Personal history of (corrected) congenital malformations of heart and circulatory system: Secondary | ICD-10-CM | POA: Insufficient documentation

## 2015-03-25 DIAGNOSIS — K5900 Constipation, unspecified: Secondary | ICD-10-CM | POA: Diagnosis not present

## 2015-03-25 DIAGNOSIS — Z8719 Personal history of other diseases of the digestive system: Secondary | ICD-10-CM | POA: Diagnosis not present

## 2015-03-25 NOTE — Progress Notes (Signed)
     03/25/2015 Kristopher [name] [id number] August 10, 1932   History of Present Illness:  Patient has history of GI bleeding from small bowel AVM's.  Symptomatic anemia requiring transfusion.  Eliquis was discontinue recently.  See Amy's note from 01/20/2015 for full details.  Is on daily PPI and iron supplement.  Hgb was 9.6 grams on 01/19/2015, 10.1 grams on 02/03/2015, and 11.2 grams on 02/22/2015.  He is here today because he had one episode of black stools on 6/19 several hours after eating a meal with rare prime rib.  No further dark stools since that time.  No abdominal pain.  Having some constipation since starting the iron supplement.  Says that some days his stools are soft, but other days they are very hard.   Current Medications, Allergies, Past Medical History, Past Surgical History, Family History and Social History were reviewed in [facility] electronic medical record.   Physical Exam: BP 112/68 mmHg  Pulse 78  Ht   Wt  General: Well developed white male in no acute distress; in wheelchair Head: Normocephalic and atraumatic Eyes:  sclerae anicteric, conjunctiva pink  Ears: Normal auditory acuity Lungs: Clear throughout to auscultation Heart: Regular rate and rhythm.  Murmur noted. Abdomen: Soft, non-distended.  BS present.  Non-tender. Musculoskeletal: Symmetrical with no gross deformities  Extremities: No edema  Neurological: Alert oriented x 4, grossly non-focal Psychological:  Alert and cooperative. 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 This was being exacerbated by chronic anticoagulation which has now been discontinued.  Had one dark stool recently following a meal of rare prime rib but none since.  Most recent Hgb 11.2 grams on 5/31, so much improved. #2 constipation:  Intermittent since starting iron supplements, but sometimes very hard  stool #3 probable segment of ischemic small bowel 2015 with GI bleed #4 hx atrial fib #5 CAD s/p CABG #6 severe arthritis  -Continue protonix 40 mg. -Continue iron supplement 325 mg daily. -Will try Miralax daily for constipation. -Follow up as needed.  Call back with any recurrent/persistent black or bloody stools.

## 2015-03-25 NOTE — Progress Notes (Signed)
i agree with the above note, plan 

## 2015-03-25 NOTE — Patient Instructions (Signed)
Take OTC Miralax daily.   Follow up as needed.

## 2015-06-04 ENCOUNTER — Encounter (HOSPITAL_COMMUNITY): Payer: Self-pay | Admitting: Emergency Medicine

## 2015-06-04 ENCOUNTER — Emergency Department (HOSPITAL_COMMUNITY)
Admission: EM | Admit: 2015-06-04 | Discharge: 2015-06-04 | Disposition: A | Discharge status: Home / Self Care | Payer: Medicare Other | Attending: Emergency Medicine | Admitting: Emergency Medicine

## 2015-06-04 ENCOUNTER — Emergency Department (HOSPITAL_COMMUNITY): Payer: Medicare Other

## 2015-06-04 DIAGNOSIS — Z8673 Personal history of transient ischemic attack (TIA), and cerebral infarction without residual deficits: Secondary | ICD-10-CM | POA: Insufficient documentation

## 2015-06-04 DIAGNOSIS — N400 Enlarged prostate without lower urinary tract symptoms: Secondary | ICD-10-CM | POA: Insufficient documentation

## 2015-06-04 DIAGNOSIS — R112 Nausea with vomiting, unspecified: Secondary | ICD-10-CM | POA: Insufficient documentation

## 2015-06-04 DIAGNOSIS — I4891 Unspecified atrial fibrillation: Secondary | ICD-10-CM | POA: Diagnosis not present

## 2015-06-04 DIAGNOSIS — I10 Essential (primary) hypertension: Secondary | ICD-10-CM | POA: Diagnosis not present

## 2015-06-04 DIAGNOSIS — Z951 Presence of aortocoronary bypass graft: Secondary | ICD-10-CM | POA: Insufficient documentation

## 2015-06-04 DIAGNOSIS — Z79899 Other long term (current) drug therapy: Secondary | ICD-10-CM | POA: Diagnosis not present

## 2015-06-04 DIAGNOSIS — Z86010 Personal history of colonic polyps: Secondary | ICD-10-CM | POA: Diagnosis not present

## 2015-06-04 DIAGNOSIS — M109 Gout, unspecified: Secondary | ICD-10-CM | POA: Insufficient documentation

## 2015-06-04 LAB — TROPONIN I

## 2015-06-04 LAB — CBC WITH DIFFERENTIAL/PLATELET
BASOS ABS: 0 10*3/uL (ref 0.0–0.1)
Basophils Relative: 0 % (ref 0–1)
EOS PCT: 0 % (ref 0–5)
Eosinophils Absolute: 0 10*3/uL (ref 0.0–0.7)
HCT: 40.2 % (ref 39.0–52.0)
Hemoglobin: 13.3 g/dL (ref 13.0–17.0)
LYMPHS ABS: 0.6 10*3/uL — AB (ref 0.7–4.0)
Lymphocytes Relative: 7 % — ABNORMAL LOW (ref 12–46)
MCH: 32.3 pg (ref 26.0–34.0)
MCHC: 33.1 g/dL (ref 30.0–36.0)
MCV: 97.6 fL (ref 78.0–100.0)
MONO ABS: 0.5 10*3/uL (ref 0.1–1.0)
Monocytes Relative: 6 % (ref 3–12)
Neutro Abs: 7.1 10*3/uL (ref 1.7–7.7)
Neutrophils Relative %: 87 % — ABNORMAL HIGH (ref 43–77)
PLATELETS: 171 10*3/uL (ref 150–400)
RBC: 4.12 MIL/uL — ABNORMAL LOW (ref 4.22–5.81)
RDW: 14.2 % (ref 11.5–15.5)
WBC: 8.1 10*3/uL (ref 4.0–10.5)

## 2015-06-04 LAB — COMPREHENSIVE METABOLIC PANEL
ALT: 24 U/L (ref 17–63)
AST: 30 U/L (ref 15–41)
Albumin: 4.3 g/dL (ref 3.5–5.0)
Alkaline Phosphatase: 89 U/L (ref 38–126)
Anion gap: 7 (ref 5–15)
BUN: 14 mg/dL (ref 6–20)
CHLORIDE: 105 mmol/L (ref 101–111)
CO2: 29 mmol/L (ref 22–32)
CREATININE: 0.92 mg/dL (ref 0.61–1.24)
Calcium: 9.7 mg/dL (ref 8.9–10.3)
Glucose, Bld: 133 mg/dL — ABNORMAL HIGH (ref 65–99)
POTASSIUM: 3.5 mmol/L (ref 3.5–5.1)
SODIUM: 141 mmol/L (ref 135–145)
Total Bilirubin: 1.4 mg/dL — ABNORMAL HIGH (ref 0.3–1.2)
Total Protein: 7.6 g/dL (ref 6.5–8.1)

## 2015-06-04 LAB — URINALYSIS, ROUTINE W REFLEX MICROSCOPIC
BILIRUBIN URINE: NEGATIVE
Glucose, UA: NEGATIVE mg/dL
KETONES UR: NEGATIVE mg/dL
LEUKOCYTES UA: NEGATIVE
NITRITE: NEGATIVE
PROTEIN: NEGATIVE mg/dL
Specific Gravity, Urine: 1.027 (ref 1.005–1.030)
UROBILINOGEN UA: 1 mg/dL (ref 0.0–1.0)
pH: 5.5 (ref 5.0–8.0)

## 2015-06-04 LAB — LIPASE, BLOOD: LIPASE: 24 U/L (ref 22–51)

## 2015-06-04 LAB — URINE MICROSCOPIC-ADD ON

## 2015-06-04 MED ORDER — ONDANSETRON HCL 4 MG/2ML IJ SOLN
4.0000 mg | Freq: Once | INTRAMUSCULAR | Status: AC
Start: 2015-06-04 — End: 2015-06-04
  Administered 2015-06-04: 4 mg via INTRAVENOUS
  Filled 2015-06-04: qty 2

## 2015-06-04 MED ORDER — SODIUM CHLORIDE 0.9 % IV BOLUS (SEPSIS)
1000.0000 mL | Freq: Once | INTRAVENOUS | Status: AC
Start: 2015-06-04 — End: 2015-06-04
  Administered 2015-06-04: 1000 mL via INTRAVENOUS

## 2015-06-04 MED ORDER — ONDANSETRON 8 MG PO TBDP: 8.0000 mg | ORAL_TABLET | Freq: Three times a day (TID) | ORAL | Status: DC | PRN

## 2015-06-04 NOTE — ED Notes (Signed)
Patient here from [facility] with complaints of n/v since yesterday. Fever 100.8. Zofran given no relief

## 2015-06-04 NOTE — Discharge Instructions (Signed)

## 2015-06-04 NOTE — ED Provider Notes (Signed)
CSN: [id number]     Arrival date & time 06/04/15  4098 History   First MD Initiated Contact with Patient 06/04/15 [phone]     Chief Complaint  Patient presents with  . Nausea  . Emesis      HPI Patient presents with nausea vomiting over the past 24 hours.  Denies hematemesis.  No melena or hematochezia.  Denies fevers and chills.  No abdominal pain.  He reports mild generalized weakness.  He was noted to have a temp of 100.8.  He denies dysuria or urinary frequency.  No recent sick contacts.   Past Medical History  Diagnosis Date  . Atrial fibrillation   . Hx of CABG   . Hypertension   . DJD (degenerative joint disease), cervical   . Colon polyps   . Gout   . BPH (benign prostatic hyperplasia)   . Stroke     "light stroke" per daughter   Past Surgical History  Procedure Laterality Date  . Coronary artery bypass graft    . Appendectomy    . Tonsillectomy    . Colonoscopy    . Upper gastrointestinal endoscopy    . Esophagogastroduodenoscopy N/A 12/20/2013    Procedure: ESOPHAGOGASTRODUODENOSCOPY (EGD);  Surgeon: Iva Boop, MD;  Location[facility] ENDOSCOPY;  Service: Endoscopy;  Laterality: N/A;  might be bedside  . Colonoscopy N/A 12/22/2013    Procedure: COLONOSCOPY;  Surgeon: Rachael Fee, MD;  Location: [facility] ENDOSCOPY;  Service: Endoscopy;  Laterality: N/A;  . Givens capsule study N/A 12/22/2013    Procedure: GIVENS CAPSULE STUDY;  Surgeon: Rachael Fee, MD;  Location: [facility] ENDOSCOPY;  Service: Endoscopy;  Laterality: N/A;  . Eye muscle surgery     History reviewed. No pertinent family history. Social History  Substance Use Topics  . Smoking status: Never Smoker   . Smokeless tobacco: Never Used  . Alcohol Use: No    Review of Systems  All other systems reviewed and are negative.     Allergies  Codeine; Other; and Sulfa antibiotics  Home Medications   Prior to Admission medications   Medication Sig Start Date End Date Taking? Authorizing Provider   acetaminophen (TYLENOL ARTHRITIS PAIN) 650 MG CR tablet Take 650-1,300 mg by mouth at bedtime as needed for pain.   Yes Historical Provider, MD  allopurinol (ZYLOPRIM) 300 MG tablet Take 300 mg by mouth at bedtime.    Yes Historical Provider, MD  CALCIUM PO Take 600 mg by mouth at bedtime.    Yes Historical Provider, MD  diltiazem (CARDIZEM CD) 180 MG 24 hr capsule Take 180 mg by mouth at bedtime.   Yes Historical Provider, MD  finasteride (PROSCAR) 5 MG tablet Take 5 mg by mouth at bedtime.  10/15/13  Yes Historical Provider, MD  Flaxseed, Linseed, (FLAXSEED OIL MAX STR) 1300 MG CAPS Take 1 tablet by mouth at bedtime.    Yes Historical Provider, MD  IRON PO Take 1 tablet by mouth at bedtime.   Yes Historical Provider, MD  levalbuterol [facility] HFA) 45 MCG/ACT inhaler Inhale 1-2 puffs into the lungs every 4 (four) hours as needed for wheezing.   Yes Historical Provider, MD  montelukast (SINGULAIR) 10 MG tablet Take 10 mg by mouth at bedtime.  09/29/13  Yes Historical Provider, MD  Multiple Vitamins-Minerals (SUPER THERA VITE M PO) Take 1 tablet by mouth at bedtime.   Yes Historical Provider, MD  niacin (NIASPAN) 750 MG CR tablet Take 1,500 mg by mouth at bedtime.   Yes  Historical Provider, MD  pantoprazole (PROTONIX) 40 MG tablet Take 1 tablet (40 mg total) by mouth 2 (two) times daily. Patient taking differently: Take 40 mg by mouth at bedtime.  12/16/14  Yes Drema Dallas, MD  tamsulosin (FLOMAX) 0.4 MG CAPS capsule Take 0.4 mg by mouth at bedtime.   Yes Historical Provider, MD  acetaminophen (TYLENOL) 325 MG tablet Take 2 tablets (650 mg total) by mouth every 6 (six) hours as needed for mild pain (or Fever >/= 101). Patient not taking: Reported on 06/04/2015 12/15/14   Lonia Blood, MD  diltiazem (CARDIZEM CD) 120 MG 24 hr capsule Take 1 capsule (120 mg total) by mouth daily. Patient not taking: Reported on 06/04/2015 12/16/14   Lonia Blood, MD   BP 148/76 mmHg  Pulse 98  Temp(Src) 99.8  F (37.7 C) (Oral)  Resp 16  SpO2 98% Physical Exam  Constitutional: He is oriented to person, place, and time. He appears well-developed and well-nourished.  HENT:  Head: Normocephalic and atraumatic.  Eyes: EOM are normal.  Neck: Normal range of motion.  Cardiovascular: Normal rate, regular rhythm, normal heart sounds and intact distal pulses.   Pulmonary/Chest: Effort normal and breath sounds normal. No respiratory distress.  Abdominal: Soft. He exhibits no distension. There is no tenderness.  Musculoskeletal: Normal range of motion.  Neurological: He is alert and oriented to person, place, and time.  Skin: Skin is warm and dry.  Psychiatric: He has a normal mood and affect. Judgment normal.  Nursing note and vitals reviewed.   ED Course  Procedures (including critical care time) Labs Review Labs Reviewed  CBC WITH DIFFERENTIAL/PLATELET - Abnormal; Notable for the following:    RBC 4.12 (*)    Neutrophils Relative % 87 (*)    Lymphocytes Relative 7 (*)    Lymphs Abs 0.6 (*)    All other components within normal limits  COMPREHENSIVE METABOLIC PANEL - Abnormal; Notable for the following:    Glucose, Bld 133 (*)    Total Bilirubin 1.4 (*)    All other components within normal limits  URINALYSIS, ROUTINE W REFLEX MICROSCOPIC (NOT AT [facility]) - Abnormal; Notable for the following:    Color, Urine AMBER (*)    Hgb urine dipstick TRACE (*)    All other components within normal limits  LIPASE, BLOOD  TROPONIN I  URINE MICROSCOPIC-ADD ON    Imaging Review Dg Abd 2 Views  06/04/2015   CLINICAL DATA:  Nausea and vomiting for 1 day.  Fever.  EXAM: ABDOMEN - 2 VIEW  COMPARISON:  None.  FINDINGS: Gas-filled loops of small and large bowel project over the abdomen without disproportionate dilatation. There is no obvious free intraperitoneal gas. Severe degenerative change of the left hip joint is noted.  IMPRESSION: Nonobstructive bowel gas pattern.   Electronically Signed   By: Jolaine Click M.D.   On: 06/04/2015 11:54   I have personally reviewed and evaluated these images and lab results as part of my medical decision-making.   EKG Interpretation   Date/Time:  Saturday June 04 2015 09:45:54 EDT Ventricular Rate:  79 PR Interval:  144 QRS Duration: 84 QT Interval:  375 QTC Calculation: 430 R Axis:   6 Text Interpretation:  Sinus rhythm Abnormal T, consider ischemia, anterior  leads No significant change was found Confirmed by Caylei Sperry  MD, Stewart Pimenta  ([id number]) on 06/04/2015 3:00:34 PM      MDM   Final diagnoses:  Nausea & vomiting  3:15 PM Patient feels much better after IV fluids.  Repeat abdominal exam is benign.  Labs without significant abnormality.  Likely viral process.  No indication for acute imaging.  Patient understands return to ER for new or worsening symptoms    Azalia Bilis, MD 06/04/15 1515

## 2015-06-08 ENCOUNTER — Ambulatory Visit: Payer: Medicare Other | Admitting: Cardiology

## 2015-06-14 ENCOUNTER — Encounter: Payer: Self-pay | Admitting: Cardiology

## 2015-06-14 ENCOUNTER — Ambulatory Visit (INDEPENDENT_AMBULATORY_CARE_PROVIDER_SITE_OTHER): Payer: Medicare Other | Admitting: Cardiology

## 2015-06-14 VITALS — BP 140/60 | HR 90 | Ht 68.0 in | Wt 149.0 lb

## 2015-06-14 DIAGNOSIS — I2510 Atherosclerotic heart disease of native coronary artery without angina pectoris: Secondary | ICD-10-CM | POA: Diagnosis not present

## 2015-06-14 DIAGNOSIS — I480 Paroxysmal atrial fibrillation: Secondary | ICD-10-CM | POA: Diagnosis not present

## 2015-06-14 DIAGNOSIS — R011 Cardiac murmur, unspecified: Secondary | ICD-10-CM | POA: Diagnosis not present

## 2015-06-14 DIAGNOSIS — Z951 Presence of aortocoronary bypass graft: Secondary | ICD-10-CM

## 2015-06-14 NOTE — Progress Notes (Signed)
[address] [place], [place]  [address] Phone: [phone] Fax:  [phone]  Date:  06/14/2015   ID:  Kristopher [name], DOB 01/18/1932, MRN [id number]  PCP:  Kristopher Otto, MD   History of Present Illness: Kristopher [name] is a 81 y.o. male here for follow-up of paroxysmal atrial fibrillation, CAD status post 1995 CABG in [place].   Hospitalized 4/15 with severe GI bleed, hemoglobin 4.  GIB Eliquis.  His Eliquis was then restarted but one year later had another GI bleed. He has been off of anticoagulation since then. No longer taking aspirin. He has been stable.   EKG here shows sinus rhythm with heart rate of 63 beats per minute.  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 Daughter used to work at [employer], recruiting.   He used to see a cardiologist in [place] once a year. He remembers being on Coumadin. He thinks that he may have been diagnosed in 2000 with atrial fibrillation.  He was recently in the emergency room with nausea and vomiting. GI virus. Mild fever. He received IV fluids at [facility] stone as well.  Wt Readings from Last 3 Encounters:  06/14/15 149 lb (67.586 kg)  01/20/15 156 lb 2 oz (70.818 kg)  12/13/14 158 lb 8.2 oz (71.9 kg)     Past Medical History  Diagnosis Date  . Atrial fibrillation   . Hx of CABG   . Hypertension   . DJD (degenerative joint disease), cervical   . Colon polyps   . Gout   . BPH (benign prostatic hyperplasia)   . Stroke     "light stroke" per daughter    Past Surgical History  Procedure Laterality Date  . Coronary artery bypass graft    . Appendectomy    . Tonsillectomy    . Colonoscopy    . Upper gastrointestinal endoscopy    . Esophagogastroduodenoscopy N/A 12/20/2013    Procedure: ESOPHAGOGASTRODUODENOSCOPY (EGD);  Surgeon: Kristopher Boop, MD;  Location: [facility] ENDOSCOPY;  Service: Endoscopy;  Laterality: N/A;  might be bedside  . Colonoscopy N/A 12/22/2013    Procedure:  COLONOSCOPY;  Surgeon: Kristopher Fee, MD;  Location: [facility] ENDOSCOPY;  Service: Endoscopy;  Laterality: N/A;  . Givens capsule study N/A 12/22/2013    Procedure: GIVENS CAPSULE STUDY;  Surgeon: Kristopher Fee, MD;  Location: [facility] ENDOSCOPY;  Service: Endoscopy;  Laterality: N/A;  . Eye muscle surgery      Current Outpatient Prescriptions  Medication Sig Dispense Refill  . acetaminophen (TYLENOL ARTHRITIS PAIN) 650 MG CR tablet Take 650-1,300 mg by mouth at bedtime as needed for pain.    Marland Kitchen allopurinol (ZYLOPRIM) 300 MG tablet Take 300 mg by mouth at bedtime.     Marland Kitchen diltiazem (CARDIZEM CD) 180 MG 24 hr capsule Take 180 mg by mouth at bedtime.    . finasteride (PROSCAR) 5 MG tablet Take 5 mg by mouth at bedtime.     . levalbuterol (XOPENEX HFA) 45 MCG/ACT inhaler Inhale 1-2 puffs into the lungs every 4 (four) hours as needed for wheezing.    . montelukast (SINGULAIR) 10 MG tablet Take 10 mg by mouth at bedtime.     . niacin (NIASPAN) 750 MG CR tablet Take 1,500 mg by mouth at bedtime.    . ondansetron (ZOFRAN ODT) 8 MG disintegrating tablet Take 1 tablet (8 mg total) by mouth every 8 (eight) hours as needed for nausea or  vomiting. 12 tablet 0  . pantoprazole (PROTONIX) 40 MG tablet Take 1 tablet (40 mg total) by mouth 2 (two) times daily. (Patient taking differently: Take 40 mg by mouth at bedtime. ) 60 tablet 0  . tamsulosin (FLOMAX) 0.4 MG CAPS capsule Take 0.4 mg by mouth at bedtime.     No current facility-administered medications for this visit.    Allergies:    Allergies  Allergen Reactions  . Codeine     unknown  . Other Other (See Comments)    All pain medications cause a hangover effect  . Sulfa Antibiotics     unknown    Social History:  The patient  reports that he has never smoked. He has never used smokeless tobacco. He reports that he does not drink alcohol or use illicit drugs.   No family history on file.No early CAD hx. Father smoked.  Kristopher [name] - Daugter.   ROS:   Please see the history of present illness.   Denies any fevers, chills, strokelike symptoms, orthopnea, PND, rashes, bleeding, melena. Prior GI bleed. Main complaint is left lateral leg pain.  All other systems reviewed and negative.   PHYSICAL EXAM: VS:  BP 140/60 mmHg  Pulse 90  Ht  (1.727 m)  Wt 149 lb (67.586 kg)  BMI 22.66 kg/m2  SpO2 97% Well nourished, well developed, in no acute distress HEENT: normal, [place]/AT, EOMI Neck: no JVD, normal carotid upstroke, no bruit Cardiac:  normal S1, S2; RRR; 1/6 systolic murmur Lungs:  clear to auscultation bilaterally, no wheezing, rhonchi or rales Abd: soft, nontender, no hepatomegaly, no bruits Ext: no edema, 2+ distal pulses Skin: warm and dry GU: deferred Neuro: no focal abnormalities noted, AAO x 3  EKG:  06/04/15-sinus rhythm, mild T-wave inversion in the precordial leads, no significant change prior EKG .   Labs: hemoglobin 11.1 (13.3 in the setting of hemoconcentration). Creatinine 0.9 Hospital records as well as outpatient records have been reviewed.  ASSESSMENT AND PLAN:  1. Paroxysmal atrial fibrillation-currently in sinus rhythm. No longer on chronic anticoagulation. Doing well. Thankfully, he did not have any episodes of atrial fibrillation in the setting of his GI bug. Understands that this will increase risk of stroke. 2. History of GI bleed-as above. April 2015 and March 2016. Severe. 3. Coronary artery disease-status post bypass. No anginal symptoms. 4. Heart murmur-likely benign. Continue to monitor. Asymptomatic. 5. GI virus-nausea and vomiting. Improved. Resolved.  Signed, Kristopher Schultz, MD [facility]  06/14/2015 3:33 PM

## 2015-06-14 NOTE — Patient Instructions (Addendum)
Medication Instructions:  Please stop your Niaspan. Continue all other medications as listed.  Follow-Up: Follow up in 1 year with Dr. Anne Fu.  You will receive a letter in the mail 2 months before you are due.  Please call us when you receive this letter to schedule your follow up appointment.  Thank you for choosing [facility]!!

## 2015-06-24 ENCOUNTER — Telehealth: Payer: Self-pay | Admitting: Gastroenterology

## 2015-06-24 NOTE — Telephone Encounter (Signed)
Pts appointment moved up to 07/06/15@2 :15pm. Pts daughter aware of appt.

## 2015-07-06 ENCOUNTER — Telehealth: Payer: Self-pay

## 2015-07-06 ENCOUNTER — Ambulatory Visit (INDEPENDENT_AMBULATORY_CARE_PROVIDER_SITE_OTHER): Payer: Medicare Other | Admitting: Physician Assistant

## 2015-07-06 ENCOUNTER — Encounter: Payer: Self-pay | Admitting: Physician Assistant

## 2015-07-06 VITALS — BP 108/58 | HR 62

## 2015-07-06 DIAGNOSIS — K589 Irritable bowel syndrome without diarrhea: Secondary | ICD-10-CM | POA: Diagnosis not present

## 2015-07-06 DIAGNOSIS — K5909 Other constipation: Secondary | ICD-10-CM

## 2015-07-06 DIAGNOSIS — R110 Nausea: Secondary | ICD-10-CM

## 2015-07-06 DIAGNOSIS — I2510 Atherosclerotic heart disease of native coronary artery without angina pectoris: Secondary | ICD-10-CM

## 2015-07-06 MED ORDER — ONDANSETRON 8 MG PO TBDP: 8.0000 mg | ORAL_TABLET | Freq: Three times a day (TID) | ORAL | Status: DC | PRN

## 2015-07-06 MED ORDER — RIFAXIMIN 550 MG PO TABS: 550.0000 mg | ORAL_TABLET | Freq: Three times a day (TID) | ORAL | Status: DC
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
## 2015-07-06 NOTE — Patient Instructions (Signed)
We have sent the following medications to your pharmacy for you to pick up at your convenience:  Odansetron    We have sent the following medications to [facility]:  Xifaxan  Begin taking your pantoprazole 30 minutes before breakfast  Use Benefiber 1 hearping tbsp daily in water or juice.  "P" fruits:  Peaches, pears, prunes, plums, pineapple, pear juice  Sennekot - 1-2 tabs q hs

## 2015-07-06 NOTE — Telephone Encounter (Signed)
rx for Xifaxan sent electronically to [facility].  If [facility] calls here, any info they share should be passed on to patient's daughter, Kristopher [name].

## 2015-07-07 NOTE — Progress Notes (Signed)
i agree with the above note, plan 

## 2015-07-11 ENCOUNTER — Other Ambulatory Visit: Payer: Self-pay | Admitting: Emergency Medicine

## 2015-07-11 ENCOUNTER — Telehealth: Payer: Self-pay | Admitting: Physician Assistant

## 2015-07-11 MED ORDER — METRONIDAZOLE 500 MG PO TABS: 500.0000 mg | ORAL_TABLET | Freq: Two times a day (BID) | ORAL | Status: DC

## 2015-07-11 NOTE — Telephone Encounter (Signed)
Patient states Jeneen RinksXifaxin is too expensive. Is there an alternative that's cheaper?

## 2015-07-11 NOTE — Telephone Encounter (Signed)
He can try flagyl 500 mg bid x 10 days. Make sure he knows to abstain from alcoholic beverages while on flagyl and for 3 days after completion of flagyl.

## 2015-07-11 NOTE — Telephone Encounter (Signed)
Spoke to patients daughter Roanna RaiderSherri. Informed her of the medication changes and for patient to abstain from alcohol while on the medication and 3 days after. She verbalized understanding.

## 2015-07-21 ENCOUNTER — Ambulatory Visit: Payer: Medicare Other | Admitting: Physician Assistant

## 2015-08-06 ENCOUNTER — Emergency Department (HOSPITAL_COMMUNITY): Payer: Medicare Other

## 2015-08-06 ENCOUNTER — Emergency Department (HOSPITAL_COMMUNITY)
Admission: EM | Admit: 2015-08-06 | Discharge: 2015-08-06 | Disposition: A | Discharge status: Home / Self Care | Payer: Medicare Other | Attending: Emergency Medicine | Admitting: Emergency Medicine

## 2015-08-06 ENCOUNTER — Encounter (HOSPITAL_COMMUNITY): Payer: Self-pay

## 2015-08-06 DIAGNOSIS — S79912A Unspecified injury of left hip, initial encounter: Secondary | ICD-10-CM | POA: Diagnosis not present

## 2015-08-06 DIAGNOSIS — Z8673 Personal history of transient ischemic attack (TIA), and cerebral infarction without residual deficits: Secondary | ICD-10-CM | POA: Insufficient documentation

## 2015-08-06 DIAGNOSIS — S8992XA Unspecified injury of left lower leg, initial encounter: Secondary | ICD-10-CM | POA: Insufficient documentation

## 2015-08-06 DIAGNOSIS — M25562 Pain in left knee: Secondary | ICD-10-CM

## 2015-08-06 DIAGNOSIS — Y998 Other external cause status: Secondary | ICD-10-CM | POA: Insufficient documentation

## 2015-08-06 DIAGNOSIS — N400 Enlarged prostate without lower urinary tract symptoms: Secondary | ICD-10-CM | POA: Diagnosis not present

## 2015-08-06 DIAGNOSIS — M109 Gout, unspecified: Secondary | ICD-10-CM | POA: Diagnosis not present

## 2015-08-06 DIAGNOSIS — Y9389 Activity, other specified: Secondary | ICD-10-CM | POA: Diagnosis not present

## 2015-08-06 DIAGNOSIS — Z79899 Other long term (current) drug therapy: Secondary | ICD-10-CM | POA: Insufficient documentation

## 2015-08-06 DIAGNOSIS — I10 Essential (primary) hypertension: Secondary | ICD-10-CM | POA: Insufficient documentation

## 2015-08-06 DIAGNOSIS — Z86010 Personal history of colonic polyps: Secondary | ICD-10-CM | POA: Diagnosis not present

## 2015-08-06 DIAGNOSIS — Z792 Long term (current) use of antibiotics: Secondary | ICD-10-CM | POA: Insufficient documentation

## 2015-08-06 DIAGNOSIS — W1839XA Other fall on same level, initial encounter: Secondary | ICD-10-CM | POA: Insufficient documentation

## 2015-08-06 DIAGNOSIS — Z951 Presence of aortocoronary bypass graft: Secondary | ICD-10-CM | POA: Diagnosis not present

## 2015-08-06 DIAGNOSIS — Y92009 Unspecified place in unspecified non-institutional (private) residence as the place of occurrence of the external cause: Secondary | ICD-10-CM | POA: Insufficient documentation

## 2015-08-06 DIAGNOSIS — M25552 Pain in left hip: Secondary | ICD-10-CM

## 2015-08-06 DIAGNOSIS — W19XXXA Unspecified fall, initial encounter: Secondary | ICD-10-CM

## 2015-08-06 MED ORDER — ACETAMINOPHEN 325 MG PO TABS
650.0000 mg | ORAL_TABLET | Freq: Once | ORAL | Status: AC
  Administered 2015-08-06: 650 mg via ORAL
  Filled 2015-08-06: qty 2

## 2015-08-06 NOTE — ED Provider Notes (Signed)
CSN: [id number]     Arrival date & time 08/06/15  1241 History   First MD Initiated Contact with Patient 08/06/15 1322     Chief Complaint  Patient presents with  . Fall    HPI   81 year old male presents today status post fall. Patient reports that he was at home when his feet got tangled up causing him to fall landing on his left side. Patient reports pain to his left hip and left knee. Patient reports a baseline osteoarthritis causing significant pain to the hip knees and ankles, he reports this pain could likely represent exacerbation of osteoarthritis but is concern for hip fracture. At time my evaluation patient reports sharp pain to the left hip and left knee, reports he is able to move his lower extremity. Patient denies any other injuries, denies any loss of consciousness, chest pain, shortness of breath, upper extremity pain or weakness. Patient's daughter is present at the time of evaluation. Patient reports she uses a walker for ambulation, as he has difficulty due to left hip and knee pain.  Past Medical History  Diagnosis Date  . Atrial fibrillation (HCC)   . Hx of CABG   . Hypertension   . DJD (degenerative joint disease), cervical   . Colon polyps   . Gout   . BPH (benign prostatic hyperplasia)   . Stroke [facility])     "light stroke" per daughter   Past Surgical History  Procedure Laterality Date  . Coronary artery bypass graft    . Appendectomy    . Tonsillectomy    . Colonoscopy    . Upper gastrointestinal endoscopy    . Esophagogastroduodenoscopy N/A 12/20/2013    Procedure: ESOPHAGOGASTRODUODENOSCOPY (EGD);  Surgeon: Iva Boop, MD;  Location: [facility] ENDOSCOPY;  Service: Endoscopy;  Laterality: N/A;  might be bedside  . Colonoscopy N/A 12/22/2013    Procedure: COLONOSCOPY;  Surgeon: Rachael Fee, MD;  Location: [facility] ENDOSCOPY;  Service: Endoscopy;  Laterality: N/A;  . Givens capsule study N/A 12/22/2013    Procedure: GIVENS CAPSULE STUDY;  Surgeon: Rachael Fee,  MD;  Location: [facility] ENDOSCOPY;  Service: Endoscopy;  Laterality: N/A;  . Eye muscle surgery     No family history on file. Social History  Substance Use Topics  . Smoking status: Never Smoker   . Smokeless tobacco: Never Used  . Alcohol Use: No    Review of Systems  All other systems reviewed and are negative.   Allergies  Codeine; Other; and Sulfa antibiotics  Home Medications   Prior to Admission medications   Medication Sig Start Date End Date Taking? Authorizing Provider  levalbuterol [facility] HFA) 45 MCG/ACT inhaler Inhale 1-2 puffs into the lungs every 4 (four) hours as needed for wheezing.   Yes Historical Provider, MD  rifaximin (XIFAXAN) 550 MG TABS tablet Take 1 tablet (550 mg total) by mouth 3 (three) times daily. 07/06/15  Yes Lori P Hvozdovic, PA-C  acetaminophen (TYLENOL ARTHRITIS PAIN) 650 MG CR tablet Take 650-1,300 mg by mouth at bedtime as needed for pain.    Historical Provider, MD  allopurinol (ZYLOPRIM) 300 MG tablet Take 300 mg by mouth at bedtime.     Historical Provider, MD  diltiazem (CARDIZEM CD) 180 MG 24 hr capsule Take 180 mg by mouth at bedtime.    Historical Provider, MD  finasteride (PROSCAR) 5 MG tablet Take 5 mg by mouth at bedtime.  10/15/13   Historical Provider, MD  metroNIDAZOLE (FLAGYL) 500 MG tablet Take 1  tablet (500 mg total) by mouth 2 (two) times daily. Patient not taking: Reported on 08/06/2015 07/11/15   Lori P Hvozdovic, PA-C  montelukast (SINGULAIR) 10 MG tablet Take 10 mg by mouth at bedtime.  09/29/13   Historical Provider, MD  ondansetron (ZOFRAN ODT) 8 MG disintegrating tablet Take 1 tablet (8 mg total) by mouth every 8 (eight) hours as needed for nausea or vomiting. Patient not taking: Reported on 08/06/2015 07/06/15   Lori P Hvozdovic, PA-C  pantoprazole (PROTONIX) 40 MG tablet Take 1 tablet (40 mg total) by mouth 2 (two) times daily. Patient taking differently: Take 40 mg by mouth at bedtime.  12/16/14   Drema Dallasurtis J Woods, MD   tamsulosin (FLOMAX) 0.4 MG CAPS capsule Take 0.4 mg by mouth at bedtime.    Historical Provider, MD   BP 131/83 mmHg  Pulse 89  Temp(Src) 97.7 F (36.5 C) (Oral)  Resp 16  SpO2 99%   Physical Exam  Constitutional: He is oriented to person, place, and time. He appears well-developed and well-nourished. No distress.  HENT:  Head: Normocephalic and atraumatic.  Eyes: Conjunctivae are normal. Pupils are equal, round, and reactive to light. Right eye exhibits no discharge. Left eye exhibits no discharge. No scleral icterus.  Neck: Normal range of motion. Neck supple. No JVD present. No tracheal deviation present.  Pulmonary/Chest: Effort normal. No stridor.  Atraumatic nontender to palpation  Abdominal: Soft. He exhibits no distension and no mass. There is no tenderness. There is no rebound and no guarding.  Musculoskeletal: Normal range of motion. He exhibits tenderness. He exhibits no edema.  No C, T, or L spine tenderness to palpation. No obvious signs of trauma, deformity, infection, step-offs. Lung expansion normal. Bilateral lower extremity strength normal per patient, left lower extremity painful with movements., sensation grossly intac pedal pulses 2+, Refill less than 3 seconds. Hips stable no obvious deformities. Left knee pain with flexion   Neurological: He is alert and oriented to person, place, and time. Coordination normal.  Skin: Skin is warm and dry. He is not diaphoretic.  Psychiatric: He has a normal mood and affect. His behavior is normal. Judgment and thought content normal.  Nursing note and vitals reviewed.   ED Course  Procedures (including critical care time) Labs Review Labs Reviewed - No data to display  Imaging Review Dg Knee Complete 4 Views Left  08/06/2015  CLINICAL DATA:  Left hip and left knee pain after fall. EXAM: LEFT KNEE - COMPLETE 4+ VIEW COMPARISON:  None. FINDINGS: Diffuse decreased bone mineralization. Moderate tricompartmental osteoarthritic  change is present. Chondrocalcinosis is present over the medial and lateral compartments. No significant joint effusion. No acute fracture or dislocation. Significant atherosclerotic disease is present. IMPRESSION: No acute findings. Moderate osteoarthritic change. Electronically Signed   By: Elberta Fortisaniel  Boyle M.D.   On: 08/06/2015 14:48   Dg Hip Unilat With Pelvis 2-3 Views Left  08/06/2015  CLINICAL DATA:  Left hip pain, status post fall EXAM: DG HIP (WITH OR WITHOUT PELVIS) 2-3V LEFT COMPARISON:  None. FINDINGS: No fracture or dislocation is seen. Moderate degenerative changes of the left hip. Right hip joint space is preserved. Visualized bony pelvis appears intact. IMPRESSION: No fracture or dislocation is seen. Moderate degenerative changes of the left hip. Electronically Signed   By: Charline BillsSriyesh  Krishnan M.D.   On: 08/06/2015 14:46   I have personally reviewed and evaluated these images and lab results as part of my medical decision-making.   EKG Interpretation None  MDM   Final diagnoses:  Fall, initial encounter  Hip pain, acute, left  Knee pain, left   Labs:    Imaging: DG knee complete 4 view, DG appearing lateral with pelvis- degenerative changes no acute findings  Consults:   Therapeutics: Tylenol  Discharge Meds:   Assessment/Plan: Patient presents status post mechanical fall. No obvious signs of trauma, negative films. Patient be discharged home, encouraged follow-up with primary care informing them of today's visit. Patient has scheduled physical therapy this week. Patient has no other complaints today, Tylenol improved pain. Patient given strict return precautions, he verbalizes understanding and agreement for today's plan.          Eyvonne Mechanic, PA-C 08/06/15 1614  Leta Baptist, MD 08/07/15 [phone]

## 2015-08-06 NOTE — ED Notes (Signed)
He states his legs "got tangled up and I fell" onto his left side.  His left hip and leg are persistently painful; however he states these areas are "always painful anyway because of arthritis".

## 2015-08-06 NOTE — Discharge Instructions (Signed)
Please read attached information. If you experience any new or worsening signs or symptoms please return to the emergency room for evaluation. Please follow-up with your primary care provider or specialist as discussed. Please use medication prescribed only as directed and discontinue taking if you have any concerning signs or symptoms.   °

## 2015-08-25 ENCOUNTER — Other Ambulatory Visit: Payer: Self-pay | Admitting: Geriatric Medicine

## 2015-08-25 DIAGNOSIS — R4701 Aphasia: Secondary | ICD-10-CM

## 2015-09-06 ENCOUNTER — Ambulatory Visit
Admission: RE | Admit: 2015-09-06 | Discharge: 2015-09-06 | Disposition: A | Discharge status: Home / Self Care | Payer: Medicare Other | Source: Ambulatory Visit | Attending: Geriatric Medicine | Admitting: Geriatric Medicine

## 2015-09-06 DIAGNOSIS — R4701 Aphasia: Secondary | ICD-10-CM

## 2015-09-07 ENCOUNTER — Emergency Department (HOSPITAL_COMMUNITY): Payer: Medicare Other

## 2015-09-07 ENCOUNTER — Encounter (HOSPITAL_COMMUNITY): Payer: Self-pay | Admitting: Emergency Medicine

## 2015-09-07 ENCOUNTER — Inpatient Hospital Stay (HOSPITAL_COMMUNITY)
Admission: EM | Admit: 2015-09-07 | Discharge: 2015-09-12 | DRG: 312 | Disposition: A | Discharge status: Skilled Nursing Facility | Payer: Medicare Other | Attending: Internal Medicine | Admitting: Internal Medicine

## 2015-09-07 DIAGNOSIS — K219 Gastro-esophageal reflux disease without esophagitis: Secondary | ICD-10-CM | POA: Diagnosis present

## 2015-09-07 DIAGNOSIS — S72009A Fracture of unspecified part of neck of unspecified femur, initial encounter for closed fracture: Secondary | ICD-10-CM | POA: Diagnosis present

## 2015-09-07 DIAGNOSIS — M109 Gout, unspecified: Secondary | ICD-10-CM | POA: Diagnosis present

## 2015-09-07 DIAGNOSIS — N400 Enlarged prostate without lower urinary tract symptoms: Secondary | ICD-10-CM | POA: Diagnosis present

## 2015-09-07 DIAGNOSIS — R55 Syncope and collapse: Secondary | ICD-10-CM | POA: Diagnosis present

## 2015-09-07 DIAGNOSIS — D649 Anemia, unspecified: Secondary | ICD-10-CM | POA: Diagnosis present

## 2015-09-07 DIAGNOSIS — S42123A Displaced fracture of acromial process, unspecified shoulder, initial encounter for closed fracture: Secondary | ICD-10-CM | POA: Diagnosis present

## 2015-09-07 DIAGNOSIS — J939 Pneumothorax, unspecified: Secondary | ICD-10-CM | POA: Diagnosis not present

## 2015-09-07 DIAGNOSIS — S270XXA Traumatic pneumothorax, initial encounter: Secondary | ICD-10-CM | POA: Diagnosis present

## 2015-09-07 DIAGNOSIS — I480 Paroxysmal atrial fibrillation: Secondary | ICD-10-CM | POA: Diagnosis present

## 2015-09-07 DIAGNOSIS — Z885 Allergy status to narcotic agent status: Secondary | ICD-10-CM

## 2015-09-07 DIAGNOSIS — F0280 Dementia in other diseases classified elsewhere without behavioral disturbance: Secondary | ICD-10-CM | POA: Diagnosis present

## 2015-09-07 DIAGNOSIS — R011 Cardiac murmur, unspecified: Secondary | ICD-10-CM | POA: Diagnosis present

## 2015-09-07 DIAGNOSIS — I2510 Atherosclerotic heart disease of native coronary artery without angina pectoris: Secondary | ICD-10-CM | POA: Diagnosis present

## 2015-09-07 DIAGNOSIS — S32509A Unspecified fracture of unspecified pubis, initial encounter for closed fracture: Secondary | ICD-10-CM | POA: Diagnosis not present

## 2015-09-07 DIAGNOSIS — S2241XA Multiple fractures of ribs, right side, initial encounter for closed fracture: Secondary | ICD-10-CM | POA: Diagnosis present

## 2015-09-07 DIAGNOSIS — I10 Essential (primary) hypertension: Secondary | ICD-10-CM | POA: Diagnosis present

## 2015-09-07 DIAGNOSIS — M1612 Unilateral primary osteoarthritis, left hip: Secondary | ICD-10-CM | POA: Diagnosis present

## 2015-09-07 DIAGNOSIS — Z66 Do not resuscitate: Secondary | ICD-10-CM | POA: Diagnosis present

## 2015-09-07 DIAGNOSIS — F0391 Unspecified dementia with behavioral disturbance: Secondary | ICD-10-CM | POA: Diagnosis not present

## 2015-09-07 DIAGNOSIS — Z87820 Personal history of traumatic brain injury: Secondary | ICD-10-CM

## 2015-09-07 DIAGNOSIS — Z881 Allergy status to other antibiotic agents status: Secondary | ICD-10-CM | POA: Diagnosis not present

## 2015-09-07 DIAGNOSIS — S2231XD Fracture of one rib, right side, subsequent encounter for fracture with routine healing: Secondary | ICD-10-CM | POA: Diagnosis not present

## 2015-09-07 DIAGNOSIS — S42031A Displaced fracture of lateral end of right clavicle, initial encounter for closed fracture: Secondary | ICD-10-CM | POA: Diagnosis present

## 2015-09-07 DIAGNOSIS — Z79899 Other long term (current) drug therapy: Secondary | ICD-10-CM | POA: Diagnosis not present

## 2015-09-07 DIAGNOSIS — Z888 Allergy status to other drugs, medicaments and biological substances status: Secondary | ICD-10-CM | POA: Diagnosis not present

## 2015-09-07 DIAGNOSIS — E43 Unspecified severe protein-calorie malnutrition: Secondary | ICD-10-CM | POA: Diagnosis present

## 2015-09-07 DIAGNOSIS — S72111A Displaced fracture of greater trochanter of right femur, initial encounter for closed fracture: Secondary | ICD-10-CM | POA: Diagnosis present

## 2015-09-07 DIAGNOSIS — W19XXXA Unspecified fall, initial encounter: Secondary | ICD-10-CM | POA: Diagnosis present

## 2015-09-07 DIAGNOSIS — S72001D Fracture of unspecified part of neck of right femur, subsequent encounter for closed fracture with routine healing: Secondary | ICD-10-CM | POA: Diagnosis not present

## 2015-09-07 DIAGNOSIS — S42009A Fracture of unspecified part of unspecified clavicle, initial encounter for closed fracture: Secondary | ICD-10-CM | POA: Diagnosis present

## 2015-09-07 DIAGNOSIS — G9340 Encephalopathy, unspecified: Secondary | ICD-10-CM | POA: Diagnosis present

## 2015-09-07 DIAGNOSIS — Z951 Presence of aortocoronary bypass graft: Secondary | ICD-10-CM

## 2015-09-07 DIAGNOSIS — S32592A Other specified fracture of left pubis, initial encounter for closed fracture: Secondary | ICD-10-CM | POA: Diagnosis present

## 2015-09-07 DIAGNOSIS — S32501D Unspecified fracture of right pubis, subsequent encounter for fracture with routine healing: Secondary | ICD-10-CM | POA: Diagnosis not present

## 2015-09-07 DIAGNOSIS — S2239XA Fracture of one rib, unspecified side, initial encounter for closed fracture: Secondary | ICD-10-CM | POA: Diagnosis present

## 2015-09-07 DIAGNOSIS — Z8673 Personal history of transient ischemic attack (TIA), and cerebral infarction without residual deficits: Secondary | ICD-10-CM | POA: Diagnosis not present

## 2015-09-07 DIAGNOSIS — S0181XA Laceration without foreign body of other part of head, initial encounter: Secondary | ICD-10-CM | POA: Diagnosis present

## 2015-09-07 DIAGNOSIS — S329XXA Fracture of unspecified parts of lumbosacral spine and pelvis, initial encounter for closed fracture: Secondary | ICD-10-CM | POA: Diagnosis present

## 2015-09-07 DIAGNOSIS — S42011G Anterior displaced fracture of sternal end of right clavicle, subsequent encounter for fracture with delayed healing: Secondary | ICD-10-CM | POA: Diagnosis not present

## 2015-09-07 DIAGNOSIS — S42013G Anterior displaced fracture of sternal end of unspecified clavicle, subsequent encounter for fracture with delayed healing: Secondary | ICD-10-CM | POA: Diagnosis present

## 2015-09-07 LAB — PROTIME-INR
INR: 1.16 (ref 0.00–1.49)
PROTHROMBIN TIME: 15 s (ref 11.6–15.2)

## 2015-09-07 LAB — CBC WITH DIFFERENTIAL/PLATELET
BASOS ABS: 0 10*3/uL (ref 0.0–0.1)
BASOS PCT: 0 %
EOS ABS: 0 10*3/uL (ref 0.0–0.7)
Eosinophils Relative: 0 %
HCT: 32.2 % — ABNORMAL LOW (ref 39.0–52.0)
HEMOGLOBIN: 10.7 g/dL — AB (ref 13.0–17.0)
Lymphocytes Relative: 6 %
Lymphs Abs: 0.4 10*3/uL — ABNORMAL LOW (ref 0.7–4.0)
MCH: 31.6 pg (ref 26.0–34.0)
MCHC: 33.2 g/dL (ref 30.0–36.0)
MCV: 95 fL (ref 78.0–100.0)
Monocytes Absolute: 0.3 10*3/uL (ref 0.1–1.0)
Monocytes Relative: 4 %
NEUTROS ABS: 6.3 10*3/uL (ref 1.7–7.7)
NEUTROS PCT: 90 %
Platelets: 201 10*3/uL (ref 150–400)
RBC: 3.39 MIL/uL — AB (ref 4.22–5.81)
RDW: 14 % (ref 11.5–15.5)
WBC: 7.1 10*3/uL (ref 4.0–10.5)

## 2015-09-07 LAB — COMPREHENSIVE METABOLIC PANEL
ALBUMIN: 3.3 g/dL — AB (ref 3.5–5.0)
ALK PHOS: 130 U/L — AB (ref 38–126)
ALT: 21 U/L (ref 17–63)
ANION GAP: 7 (ref 5–15)
AST: 28 U/L (ref 15–41)
BUN: 8 mg/dL (ref 6–20)
CALCIUM: 8.9 mg/dL (ref 8.9–10.3)
CO2: 28 mmol/L (ref 22–32)
Chloride: 104 mmol/L (ref 101–111)
Creatinine, Ser: 0.74 mg/dL (ref 0.61–1.24)
GFR calc Af Amer: >60 mL/min (ref >60)
GFR calc non Af Amer: >60 mL/min (ref >60)
GLUCOSE: 132 mg/dL — AB (ref 65–99)
Potassium: 3.5 mmol/L (ref 3.5–5.1)
SODIUM: 139 mmol/L (ref 135–145)
Total Bilirubin: 1.2 mg/dL (ref 0.3–1.2)
Total Protein: 5.8 g/dL — ABNORMAL LOW (ref 6.5–8.1)

## 2015-09-07 LAB — URINALYSIS, ROUTINE W REFLEX MICROSCOPIC
Bilirubin Urine: NEGATIVE
GLUCOSE, UA: NEGATIVE mg/dL
KETONES UR: NEGATIVE mg/dL
LEUKOCYTES UA: NEGATIVE
Nitrite: NEGATIVE
PH: 7.5 (ref 5.0–8.0)
Protein, ur: NEGATIVE mg/dL
Specific Gravity, Urine: 1.01 (ref 1.005–1.030)

## 2015-09-07 LAB — CBG MONITORING, ED: GLUCOSE-CAPILLARY: 122 mg/dL — AB (ref 65–99)

## 2015-09-07 LAB — TSH: TSH: 0.785 u[IU]/mL (ref 0.350–4.500)

## 2015-09-07 LAB — I-STAT TROPONIN, ED: TROPONIN I, POC: 0.04 ng/mL (ref 0.00–0.08)

## 2015-09-07 LAB — URINE MICROSCOPIC-ADD ON

## 2015-09-07 LAB — MAGNESIUM: MAGNESIUM: 1.6 mg/dL — AB (ref 1.7–2.4)

## 2015-09-07 LAB — CK: Total CK: 234 U/L (ref 49–397)

## 2015-09-07 LAB — TROPONIN I: Troponin I: 0.06 ng/mL — ABNORMAL HIGH (ref <0.031)

## 2015-09-07 LAB — I-STAT CG4 LACTIC ACID, ED: Lactic Acid, Venous: 1.4 mmol/L (ref 0.5–2.0)

## 2015-09-07 MED ORDER — ONDANSETRON HCL 4 MG/2ML IJ SOLN
4.0000 mg | Freq: Once | INTRAMUSCULAR | Status: AC
  Administered 2015-09-07: 4 mg via INTRAVENOUS
  Filled 2015-09-07: qty 2

## 2015-09-07 MED ORDER — ACETAMINOPHEN 650 MG RE SUPP: 650.0000 mg | Freq: Four times a day (QID) | RECTAL | Status: DC | PRN

## 2015-09-07 MED ORDER — SODIUM CHLORIDE 0.9 % IJ SOLN
3.0000 mL | Freq: Two times a day (BID) | INTRAMUSCULAR | Status: DC
  Administered 2015-09-07 – 2015-09-11 (×6): 3 mL via INTRAVENOUS

## 2015-09-07 MED ORDER — ONDANSETRON HCL 4 MG/2ML IJ SOLN: 4.0000 mg | Freq: Four times a day (QID) | INTRAMUSCULAR | Status: DC | PRN

## 2015-09-07 MED ORDER — LEVALBUTEROL HCL 0.63 MG/3ML IN NEBU: 0.6300 mg | INHALATION_SOLUTION | RESPIRATORY_TRACT | Status: DC | PRN

## 2015-09-07 MED ORDER — LIDOCAINE-EPINEPHRINE (PF) 2 %-1:200000 IJ SOLN
10.0000 mL | Freq: Once | INTRAMUSCULAR | Status: AC
  Administered 2015-09-07: 10 mL
  Filled 2015-09-07: qty 20

## 2015-09-07 MED ORDER — IPRATROPIUM-ALBUTEROL 0.5-2.5 (3) MG/3ML IN SOLN
3.0000 mL | Freq: Four times a day (QID) | RESPIRATORY_TRACT | Status: DC
  Administered 2015-09-07: 3 mL via RESPIRATORY_TRACT
  Filled 2015-09-07: qty 3

## 2015-09-07 MED ORDER — FENTANYL CITRATE (PF) 100 MCG/2ML IJ SOLN
12.5000 ug | INTRAMUSCULAR | Status: DC | PRN
  Administered 2015-09-07: 25 ug via INTRAVENOUS
  Filled 2015-09-07: qty 2

## 2015-09-07 MED ORDER — IOHEXOL 300 MG/ML  SOLN
80.0000 mL | Freq: Once | INTRAMUSCULAR | Status: AC | PRN
  Administered 2015-09-07: 80 mL via INTRAVENOUS

## 2015-09-07 MED ORDER — ACETAMINOPHEN 325 MG PO TABS
650.0000 mg | ORAL_TABLET | Freq: Four times a day (QID) | ORAL | Status: DC | PRN
  Administered 2015-09-11: 650 mg via ORAL
  Filled 2015-09-07: qty 2

## 2015-09-07 MED ORDER — DILTIAZEM HCL ER COATED BEADS 180 MG PO CP24
180.0000 mg | ORAL_CAPSULE | Freq: Every day | ORAL | Status: DC
  Administered 2015-09-09 – 2015-09-11 (×3): 180 mg via ORAL
  Filled 2015-09-07 (×5): qty 1

## 2015-09-07 MED ORDER — ACETAMINOPHEN 325 MG PO TABS: 325.0000 mg | ORAL_TABLET | Freq: Four times a day (QID) | ORAL | Status: DC | PRN

## 2015-09-07 MED ORDER — POTASSIUM CHLORIDE IN NACL 20-0.9 MEQ/L-% IV SOLN
INTRAVENOUS | Status: AC
  Administered 2015-09-07: 22:00:00 via INTRAVENOUS
  Filled 2015-09-07 (×2): qty 1000

## 2015-09-07 MED ORDER — FENTANYL CITRATE (PF) 100 MCG/2ML IJ SOLN
50.0000 ug | Freq: Once | INTRAMUSCULAR | Status: AC
  Administered 2015-09-07: 50 ug via INTRAVENOUS
  Filled 2015-09-07: qty 2

## 2015-09-07 MED ORDER — OXYCODONE HCL 5 MG PO TABS
5.0000 mg | ORAL_TABLET | ORAL | Status: DC | PRN
  Filled 2015-09-07: qty 1

## 2015-09-07 MED ORDER — ONDANSETRON HCL 4 MG PO TABS: 4.0000 mg | ORAL_TABLET | Freq: Four times a day (QID) | ORAL | Status: DC | PRN

## 2015-09-07 MED ORDER — LEVALBUTEROL TARTRATE 45 MCG/ACT IN AERO: 1.0000 | INHALATION_SPRAY | RESPIRATORY_TRACT | Status: DC | PRN

## 2015-09-07 MED ORDER — IPRATROPIUM-ALBUTEROL 0.5-2.5 (3) MG/3ML IN SOLN
3.0000 mL | Freq: Two times a day (BID) | RESPIRATORY_TRACT | Status: DC
  Administered 2015-09-08 – 2015-09-12 (×9): 3 mL via RESPIRATORY_TRACT
  Filled 2015-09-07 (×8): qty 3

## 2015-09-07 MED ORDER — HYDROMORPHONE HCL 1 MG/ML IJ SOLN
0.5000 mg | INTRAMUSCULAR | Status: DC | PRN
  Administered 2015-09-07 – 2015-09-08 (×2): 0.5 mg via INTRAVENOUS
  Filled 2015-09-07 (×2): qty 1

## 2015-09-07 NOTE — Consult Note (Signed)
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 hopefully will not need CT, xray in AM Ribs 2-4 fx - pain control, pulm toilet, duo nebs Small amount of free fluid without significant intraabdominal injury - serial exams and monitor Hgb, no surgery planned Left inferior ramus fx - ortho consult Left comminuted greater trochanter fx - ortho consult TBI/Right frontal laceration - repair by EDP, local care, will need TBI teams tomorrow MMP - AFIB without anticoagulation, CAD s/p CABG, recent GI bleed, h/o TIA, DJD/OA, recent cognitive decline FEN - allow clears if able to sit up in bed, pain control DVT Proph - per primary, no longer on anticoagulation for AFIB due to GI bleeding Disp - Inpatient workup    Nat Christen, [facility] 09/07/2015, 1:59 PM Pager: [phone]

## 2015-09-07 NOTE — Discharge Instructions (Signed)
-  You may take over-the-counter Tylenol for your headache.  -Please follow-up with [facility] within 3 days.  -Please seek immediate medical attention if:  Your headache becomes really bad.  You keep throwing up.  You have a stiff neck.  You have trouble seeing.  You have trouble speaking.  You have pain in the eye or ear.  Your muscles are weak or you lose muscle control.  You lose your balance or have trouble walking.  You feel like you will pass out (faint) or you pass out.  You have confusion.     General Headache Without Cause A headache is pain or discomfort felt around the head or neck area. There are many causes and types of headaches. In some cases, the cause may not be found.  HOME CARE  Managing Pain  Take over-the-counter and prescription medicines only as told by your doctor.  Lie down in a dark, quiet room when you have a headache.  If directed, apply ice to the head and neck area:  Put ice in a plastic bag.  Place a towel between your skin and the bag.  Leave the ice on for 20 minutes, 2-3 times per day.  Use a heating pad or hot shower to apply heat to the head and neck area as told by your doctor.  Keep lights dim if bright lights bother you or make your headaches worse. Eating and Drinking  Eat meals on a regular schedule.  Lessen how much alcohol you drink.  Lessen how much caffeine you drink, or stop drinking caffeine. General Instructions  Keep all follow-up visits as told by your doctor. This is important.  Keep a journal to find out if certain things bring on headaches. For example, write down:  What you eat and drink.  How much sleep you get.  Any change to your diet or medicines.  Relax by getting a massage or doing other relaxing activities.  Lessen stress.  Sit up straight. Do not tighten (tense) your muscles.  Do not use tobacco products. This includes cigarettes, chewing tobacco, or  e-cigarettes. If you need help quitting, ask your doctor.  Exercise regularly as told by your doctor.  Get enough sleep. This often means 7-9 hours of sleep. GET HELP IF:  Your symptoms are not helped by medicine.  You have a headache that feels different than the other headaches.  You feel sick to your stomach (nauseous) or you throw up (vomit).  You have a fever. GET HELP RIGHT AWAY IF:   Your headache becomes really bad.  You keep throwing up.  You have a stiff neck.  You have trouble seeing.  You have trouble speaking.  You have pain in the eye or ear.  Your muscles are weak or you lose muscle control.  You lose your balance or have trouble walking.  You feel like you will pass out (faint) or you pass out.  You have confusion.   This information is not intended to replace advice given to you by your health care provider. Make sure you discuss any questions you have with your health care provider.   Document Released: 06/19/2008 Document Revised: 06/01/2015 Document Reviewed: 01/03/2015 Elsevier Interactive Patient Education [employer]2016 [employer]

## 2015-09-07 NOTE — ED Notes (Signed)
MD at bedside. 

## 2015-09-07 NOTE — ED Provider Notes (Signed)
LACERATION REPAIR Performed by: Tilden FossaElizabeth Marquasha [name] Authorized by: Tilden FossaElizabeth Laurice [name] Consent: Verbal consent obtained. Risks and benefits: risks, benefits and alternatives were discussed Consent given by: patient Patient identity confirmed: provided demographic data Prepped and Draped in normal sterile fashion Wound explored  Laceration Location: right forehead  Laceration Length: 6cm  No Foreign Bodies seen or palpated  Anesthesia: local infiltration  Local anesthetic: lidocaine 1% with epinephrine  Anesthetic total: 3 ml  Irrigation method: syringe Amount of cleaning: standard  Skin closure: 5-0 prolene  Number of sutures: 7  Technique: interrupted  Patient tolerance: Patient tolerated the procedure well with no immediate complications.  The wound was debrided of devascularized tissue  Tilden FossaElizabeth Sarvesh Meddaugh, MD 09/07/15 [phone]

## 2015-09-07 NOTE — Consult Note (Signed)
ORTHOPAEDIC CONSULTATION  REQUESTING PHYSICIAN: Elmarie Shiley, MD  Chief Complaint: L hip pain, L shoulder pain, chest pain  HPI: Kristopher [name] is a 81 y.o. male who complains of L hip and shoulder pain as well as chest pain after a mechanical fall at his independent living facility this morning.  The fall was unwitnessed, so it is unclear how long the patient was down on the floor.  Per the family, the patient fell in the bathroom and then tried to crawl to the phone in his main room.  He was found later by a caretaker coming to help with his medications.    The patient was transferred to the ED for evaluation.  He was found to have a minimally displaced L distal and proximal clavicle and distal acromion fracture.  He was also found to have fractures of the 2nd/3rd/4th ribs on the R side.  He has a right pneumothorax.  Lastly, he also has a L greater trochanteric fracture.  The patient has a history of CAD with open heart in 95.  He also has Afib and HTN.  At baseline, the patient is ambulatory with the assistance of a walker due to severe L hip DJD.   Past Medical History  Diagnosis Date  . Atrial fibrillation ([place])   . Hx of CABG   . Hypertension   . DJD (degenerative joint disease), cervical   . Colon polyps   . Gout   . BPH (benign prostatic hyperplasia)   . Stroke [facility])     "light stroke" per daughter   Past Surgical History  Procedure Laterality Date  . Coronary artery bypass graft    . Appendectomy    . Tonsillectomy    . Colonoscopy    . Upper gastrointestinal endoscopy    . Esophagogastroduodenoscopy N/A 12/20/2013    Procedure: ESOPHAGOGASTRODUODENOSCOPY (EGD);  Surgeon: Gatha Mayer, MD;  Location: [facility] ENDOSCOPY;  Service: Endoscopy;  Laterality: N/A;  might be bedside  . Colonoscopy N/A 12/22/2013    Procedure: COLONOSCOPY;  Surgeon: Milus Banister, MD;  Location: [place];  Service: Endoscopy;  Laterality: N/A;  . Givens capsule study N/A 12/22/2013   Procedure: GIVENS CAPSULE STUDY;  Surgeon: Milus Banister, MD;  Location: [place];  Service: Endoscopy;  Laterality: N/A;  . Eye muscle surgery     Social History   Social History  . Marital Status: Widowed    Spouse Name: N/A  . Number of Children: N/A  . Years of Education: N/A   Social History Main Topics  . Smoking status: Never Smoker   . Smokeless tobacco: Never Used  . Alcohol Use: No  . Drug Use: No  . Sexual Activity: Not Asked   Other Topics Concern  . 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 Allergies  Allergen Reactions  . Codeine     unknown  . Other Other (See Comments)    All pain medications cause a hangover effect  . Sulfa Antibiotics     unknown   Prior to Admission medications   Medication Sig Start Date End Date Taking? Authorizing Provider  acetaminophen (TYLENOL ARTHRITIS PAIN) 650 MG CR tablet Take 650-1,300 mg by mouth at bedtime as needed for pain.   Yes Historical Provider, MD  allopurinol (ZYLOPRIM) 300 MG  tablet Take 300 mg by mouth daily.    Yes Historical Provider, MD  diltiazem (CARDIZEM CD) 180 MG 24 hr capsule Take 180 mg by mouth daily.    Yes Historical Provider, MD  finasteride (PROSCAR) 5 MG tablet Take 5 mg by mouth daily.  10/15/13  Yes Historical Provider, MD  levalbuterol Penne Lash HFA) 45 MCG/ACT inhaler Inhale 1-2 puffs into the lungs every 4 (four) hours as needed for wheezing.   Yes Historical Provider, MD  montelukast (SINGULAIR) 10 MG tablet Take 10 mg by mouth daily.  09/29/13  Yes Historical Provider, MD  pantoprazole (PROTONIX) 40 MG tablet Take 1 tablet (40 mg total) by mouth 2 (two) times daily. Patient taking differently: Take 40 mg by mouth at bedtime.  12/16/14  Yes Allie Bossier, MD  tamsulosin (FLOMAX) 0.4 MG CAPS capsule Take 0.4 mg by mouth daily.    Yes  Historical Provider, MD   Dg Chest 1 View  09/07/2015  CLINICAL DATA:  Fall. EXAM: CHEST 1 VIEW COMPARISON:  December 19, 2013. FINDINGS: The heart size and mediastinal contours are within normal limits. Minimal right apical pneumothorax is noted. Moderately displaced fracture is seen involving the lateral portion of the right fourth rib. Left lung is clear. No significant pleural effusion is noted. Mildly displaced distal right clavicular fracture is noted, as well as mildly displaced fracture involving the acromion. Atherosclerosis of thoracic aorta is noted. IMPRESSION: Mildly displaced fractures are seen involving the distal right clavicle and acromion. Moderately displaced fracture involving lateral portion of right fourth rib. Minimal right apical pneumothorax is noted. Critical Value/emergent results were called by telephone at the time of interpretation on 09/07/2015 at 12:26 pm to Dr. Quintella Reichert , who verbally acknowledged these results. Electronically Signed   By: Marijo Conception, M.D.   On: 09/07/2015 12:27   Ct Head Wo Contrast  09/07/2015  CLINICAL DATA:  Found laying on bathroom floor, laceration along the right forehead. Fall. EXAM: CT HEAD WITHOUT CONTRAST CT CERVICAL SPINE WITHOUT CONTRAST TECHNIQUE: Multidetector CT imaging of the head and cervical spine was performed following the standard protocol without intravenous contrast. Multiplanar CT image reconstructions of the cervical spine were also generated. COMPARISON:  09/06/2015 FINDINGS: CT HEAD FINDINGS The brainstem, cerebellum, cerebral peduncles, thalami, basal ganglia, basilar cisterns, and ventricular system appear within normal limits. Periventricular white matter and corona radiata hypodensities favor chronic ischemic microvascular white matter disease. No intracranial hemorrhage, mass lesion, or acute CVA. Right frontal scalp laceration. Acute on chronic right maxillary sinusitis with chronic ethmoid and chronic left maxillary  sinusitis. There is atherosclerotic calcification of the cavernous carotid arteries bilaterally. CT CERVICAL SPINE FINDINGS Calcified pannus posterior to the odontoid. Similar appearance of 3 mm degenerative retrolisthesis at C4-5. 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 The left C2- 3 and C3-4 facet joints are fused. There is some bridging interbody spurring at C3-4. Degenerative endplate sclerosis at S5-0 and C6-7 both with posterior osseous ridging. No cervical spine fracture or acute subluxation is identified. There is evidence of moderate central narrowing of the thecal sac at C4-5 due to subluxation and spurring. Osseous right foraminal stenosis is severe at C4-5 and mild to moderate at C3-4. Spurring causes left foraminal stenosis at C3-4, C4-5, and C6-7. No prevertebral soft tissue swelling. There is a small right apical pneumothorax. Fracture the right second rib medially, observed on image 20 series 8. Displaced medial clavicular fracture on the right. Based on conventional radiography today the patient  is also thought to have lateral clavicular fracture on the right as well as a lateral acromial fracture. IMPRESSION: 1. Small right apical pneumothorax with fracture of the right second rib medially. Right medial clavicular fracture. Based on today's conventional radiographs the patient is also thought to have a right lateral clavicular fracture as well as other right rib fractures including the right third and fourth ribs. 2. Right frontal scalp laceration.  No acute intracranial findings. 3. Cervical spondylosis and degenerative disc disease with multilevel impingement, but no acute cervical spine fracture. These results were called by telephone at the time of interpretation on 09/07/2015 at 12:12 pm to Dr. Quintella Reichert , who verbally acknowledged these results. Electronically Signed   By: Van Clines M.D.   On: 09/07/2015 12:15   Ct Chest W Contrast  09/07/2015   CLINICAL DATA:  [place] today.  Rib fractures and pneumothorax. EXAM: CT CHEST, ABDOMEN, AND PELVIS WITH CONTRAST TECHNIQUE: Multidetector CT imaging of the chest, abdomen and pelvis was performed following the standard protocol during bolus administration of intravenous contrast. CONTRAST:  24m OMNIPAQUE IOHEXOL 300 MG/ML  SOLN COMPARISON:  Chest x-ray 09/07/2015 and CT abdomen/ pelvis 01/10/2014 FINDINGS: CT CHEST FINDINGS Mediastinum/Lymph Nodes: Right-sided chest wall edema/ hematoma likely related to right clavicle fracture. No chest wall mass, supraclavicular or axillary adenopathy. The thyroid gland is grossly normal. The major vascular structures are patent. The heart is normal in size for age. No pericardial effusion. Moderate tortuosity, ectasia and calcification of the thoracic aorta but no focal aneurysm or dissection. Three-vessel coronary artery calcifications are noted. Small amount of probable mediastinal hematoma likely from the displace clavicle fracture near the sternoclavicular joint. The pulmonary arteries appear normal. The esophagus is grossly normal. Lungs/Pleura: There is a moderate-sized anterior pneumothorax estimated at 15%. There are also small pleural effusions and bibasilar atelectasis. No pulmonary contusion or worrisome pulmonary lesions. Musculoskeletal: Displaced clavicle fracture noted at the sternoclavicular joint. The anterior second and third ribs are fractured. There is also a fourth rib fracture laterally. No left-sided rib fractures are identified. The vertebral bodies are intact. No sternal fracture. Sternal wires related to prior bypass surgery. CT ABDOMEN PELVIS FINDINGS Hepatobiliary: No focal hepatic hepatic lesions or intrahepatic biliary dilatation. No acute hepatic injury. The gallbladder is mildly distended. No acute inflammation. No common bile duct dilatation. Pancreas: No mass, inflammation or acute injury. Spleen: Intact.  No acute injury. Adrenals/Urinary Tract:  The adrenal glands are normal. There are multiple bilateral renal calculi and renal cortical thinning but no acute renal injury. Stomach/Bowel: The stomach, duodenum, small bowel and colon are grossly normal without oral contrast. Vascular/Lymphatic: Moderate atherosclerotic calcifications involving the aorta and branch vessels but no dissection or focal aneurysm. Fairly significant bilateral renal artery calcifications. Small infrarenal abdominal aortic aneurysm just above the iliac artery bifurcation measuring 3.0 x 3.0 cm. Reproductive: The prostate gland is enlarged. There is significant median lobe hypertrophy projecting well up into the bladder. This appears relatively stable since 2015. Other: Small amount of free pelvic fluid is noted of uncertain significance or etiology. Musculoskeletal: There is a fracture involving the greater trochanter of the left hip. No definite neck or intertrochanteric fracture. There are advanced degenerative changes involving both hips. There is diffuse osteo per [place] and advanced degenerative changes involving the spine. IMPRESSION: 1. Displaced proximal right clavicle fracture near the sternoclavicular joint with moderate surrounding hematoma. 2. Second, third and fourth right rib fractures. 3. Estimated 15% right-sided pneumothorax. No pulmonary contusions. Small effusions and  bibasilar atelectasis. 4. No acute solid organ injury is identified in the abdomen. There is free pelvic fluid noted in the right pericolic gutter and pelvis of uncertain etiology or significance. Could not exclude a subtle bowel injury. No free air is identified. Recommend close clinical follow-up. 5. Numerous bilateral renal calculi. 6. Advanced atherosclerotic calcifications involving the thoracic and abdominal aortas and branch vessels. 7. Significant median lobe hypertrophy of the prostate gland. 8. Comminuted fracture involving the greater trochanter of the left hip. 9. Left inferior pubic ramus  fracture. Electronically Signed   By: Marijo Sanes M.D.   On: 09/07/2015 13:15   Ct Cervical Spine Wo Contrast  09/07/2015  CLINICAL DATA:  Found laying on bathroom floor, laceration along the right forehead. Fall. EXAM: CT HEAD WITHOUT CONTRAST CT CERVICAL SPINE WITHOUT CONTRAST TECHNIQUE: Multidetector CT imaging of the head and cervical spine was performed following the standard protocol without intravenous contrast. Multiplanar CT image reconstructions of the cervical spine were also generated. COMPARISON:  09/06/2015 FINDINGS: CT HEAD FINDINGS The brainstem, cerebellum, cerebral peduncles, thalami, basal ganglia, basilar cisterns, and ventricular system appear within normal limits. Periventricular white matter and corona radiata hypodensities favor chronic ischemic microvascular white matter disease. No intracranial hemorrhage, mass lesion, or acute CVA. Right frontal scalp laceration. Acute on chronic right maxillary sinusitis with chronic ethmoid and chronic left maxillary sinusitis. There is atherosclerotic calcification of the cavernous carotid arteries bilaterally. CT CERVICAL SPINE FINDINGS Calcified pannus posterior to the odontoid. Similar appearance of 3 mm degenerative retrolisthesis at C4-5. 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 The left C2- 3 and C3-4 facet joints are fused. There is some bridging interbody spurring at C3-4. Degenerative endplate sclerosis at U2-0 and C6-7 both with posterior osseous ridging. No cervical spine fracture or acute subluxation is identified. There is evidence of moderate central narrowing of the thecal sac at C4-5 due to subluxation and spurring. Osseous right foraminal stenosis is severe at C4-5 and mild to moderate at C3-4. Spurring causes left foraminal stenosis at C3-4, C4-5, and C6-7. No prevertebral soft tissue swelling. There is a small right apical pneumothorax. Fracture the right second rib medially, observed on image 20 series  8. Displaced medial clavicular fracture on the right. Based on conventional radiography today the patient is also thought to have lateral clavicular fracture on the right as well as a lateral acromial fracture. IMPRESSION: 1. Small right apical pneumothorax with fracture of the right second rib medially. Right medial clavicular fracture. Based on today's conventional radiographs the patient is also thought to have a right lateral clavicular fracture as well as other right rib fractures including the right third and fourth ribs. 2. Right frontal scalp laceration.  No acute intracranial findings. 3. Cervical spondylosis and degenerative disc disease with multilevel impingement, but no acute cervical spine fracture. These results were called by telephone at the time of interpretation on 09/07/2015 at 12:12 pm to Dr. Quintella Reichert , who verbally acknowledged these results. Electronically Signed   By: Van Clines M.D.   On: 09/07/2015 12:15   Mr Brain Wo Contrast  09/06/2015  CLINICAL DATA:  Aphasia, memory loss and confusion for 1.5 years increasing in frequency. Stroke risk factors include atrial fibrillation, hypertension, and CABG. Head injury February 2015. EXAM: MRI HEAD WITHOUT CONTRAST TECHNIQUE: Multiplanar, multiecho pulse sequences of the brain and surrounding structures were obtained without intravenous contrast. COMPARISON:  CT head 12/19/2013. FINDINGS: No evidence for acute infarction, hemorrhage, mass lesion, or extra-axial fluid. Advanced  cerebral and cerebellar atrophy. Hydrocephalus ex vacuo. Mild to moderate T2 and FLAIR hyperintensities throughout periventricular and subcortical white matter representing chronic microvascular ischemic change. Remote LEFT parietal and LEFT temporal cortical and subcortical infarcts. Dolichoectatic but widely patent cerebral vasculature. Cervical spondylosis with suspected spinal stenosis at C3-4 and C4-5. Unremarkable pituitary and cerebellar tonsils. No  osseous findings. Extracranial soft tissues unremarkable. Chronic RIGHT maxillary sinus disease with retention cyst formation. BILATERAL cataract extraction appears uncomplicated. Compared with the prior CT in 2015, progression of atrophy is noted. The chronic areas of cortical infarction were observed on the scan, but there is increasing diffuse cortical volume loss. IMPRESSION: No acute intracranial findings. Advanced atrophy with mild to moderate small vessel disease. Remote LEFT parietal and LEFT temporal infarcts. Cervical spondylosis. Electronically Signed   By: Staci Righter M.D.   On: 09/06/2015 13:42   Ct Abdomen Pelvis W Contrast  09/07/2015  CLINICAL DATA:  [place] today.  Rib fractures and pneumothorax. EXAM: CT CHEST, ABDOMEN, AND PELVIS WITH CONTRAST TECHNIQUE: Multidetector CT imaging of the chest, abdomen and pelvis was performed following the standard protocol during bolus administration of intravenous contrast. CONTRAST:  53m OMNIPAQUE IOHEXOL 300 MG/ML  SOLN COMPARISON:  Chest x-ray 09/07/2015 and CT abdomen/ pelvis 01/10/2014 FINDINGS: CT CHEST FINDINGS Mediastinum/Lymph Nodes: Right-sided chest wall edema/ hematoma likely related to right clavicle fracture. No chest wall mass, supraclavicular or axillary adenopathy. The thyroid gland is grossly normal. The major vascular structures are patent. The heart is normal in size for age. No pericardial effusion. Moderate tortuosity, ectasia and calcification of the thoracic aorta but no focal aneurysm or dissection. Three-vessel coronary artery calcifications are noted. Small amount of probable mediastinal hematoma likely from the displace clavicle fracture near the sternoclavicular joint. The pulmonary arteries appear normal. The esophagus is grossly normal. Lungs/Pleura: There is a moderate-sized anterior pneumothorax estimated at 15%. There are also small pleural effusions and bibasilar atelectasis. No pulmonary contusion or worrisome pulmonary  lesions. Musculoskeletal: Displaced clavicle fracture noted at the sternoclavicular joint. The anterior second and third ribs are fractured. There is also a fourth rib fracture laterally. No left-sided rib fractures are identified. The vertebral bodies are intact. No sternal fracture. Sternal wires related to prior bypass surgery. CT ABDOMEN PELVIS FINDINGS Hepatobiliary: No focal hepatic hepatic lesions or intrahepatic biliary dilatation. No acute hepatic injury. The gallbladder is mildly distended. No acute inflammation. No common bile duct dilatation. Pancreas: No mass, inflammation or acute injury. Spleen: Intact.  No acute injury. Adrenals/Urinary Tract: The adrenal glands are normal. There are multiple bilateral renal calculi and renal cortical thinning but no acute renal injury. Stomach/Bowel: The stomach, duodenum, small bowel and colon are grossly normal without oral contrast. Vascular/Lymphatic: Moderate atherosclerotic calcifications involving the aorta and branch vessels but no dissection or focal aneurysm. Fairly significant bilateral renal artery calcifications. Small infrarenal abdominal aortic aneurysm just above the iliac artery bifurcation measuring 3.0 x 3.0 cm. Reproductive: The prostate gland is enlarged. There is significant median lobe hypertrophy projecting well up into the bladder. This appears relatively stable since 2015. Other: Small amount of free pelvic fluid is noted of uncertain significance or etiology. Musculoskeletal: There is a fracture involving the greater trochanter of the left hip. No definite neck or intertrochanteric fracture. There are advanced degenerative changes involving both hips. There is diffuse osteo per [place] and advanced degenerative changes involving the spine. IMPRESSION: 1. Displaced proximal right clavicle fracture near the sternoclavicular joint with moderate surrounding hematoma. 2. Second, third and fourth right  rib fractures. 3. Estimated 15% right-sided  pneumothorax. No pulmonary contusions. Small effusions and bibasilar atelectasis. 4. No acute solid organ injury is identified in the abdomen. There is free pelvic fluid noted in the right pericolic gutter and pelvis of uncertain etiology or significance. Could not exclude a subtle bowel injury. No free air is identified. Recommend close clinical follow-up. 5. Numerous bilateral renal calculi. 6. Advanced atherosclerotic calcifications involving the thoracic and abdominal aortas and branch vessels. 7. Significant median lobe hypertrophy of the prostate gland. 8. Comminuted fracture involving the greater trochanter of the left hip. 9. Left inferior pubic ramus fracture. Electronically Signed   By: Marijo Sanes M.D.   On: 09/07/2015 13:15   Dg Hip Unilat With Pelvis 2-3 Views Left  09/07/2015  CLINICAL DATA:  Found down today. Possibly fell some time during the night. EXAM: DG HIP (WITH OR WITHOUT PELVIS) 2-3V LEFT COMPARISON:  08/06/2015 FINDINGS: Severe left hip joint degenerative changes are again demonstrated. No obvious acute fracture. The right hip is normally located. No definite fracture. The pubic symphysis and SI joints are intact. No definite pelvic fractures. IMPRESSION: No acute hip or pelvic fractures are identified. Severe left hip joint degenerative changes. Electronically Signed   By: Marijo Sanes M.D.   On: 09/07/2015 14:05    Positive ROS: All other systems have been reviewed and were otherwise negative with the exception of those mentioned in the HPI and as above.  Labs cbc  Recent Labs  09/07/15 1150  WBC 7.1  HGB 10.7*  HCT 32.2*  PLT 201    Labs inflam No results for input(s): CRP in the last 72 hours.  Invalid input(s): ESR  Labs coag No results for input(s): INR, PTT in the last 72 hours.  Invalid input(s): PT   Recent Labs  09/07/15 1150  NA 139  K 3.5  CL 104  CO2 28  GLUCOSE 132*  BUN 8  CREATININE 0.74  CALCIUM 8.9    Physical Exam: Filed  Vitals:   09/07/15 1630 09/07/15 1715  BP: 146/81 160/85  Pulse: 84 84  Temp:    Resp: 11 13   General: Alert, no acute distress Cardiovascular: No pedal edema Respiratory: No cyanosis, no use of accessory musculature GI: No organomegaly, abdomen is soft and non-tender Skin: Lesion above the R eye Neurologic: Sensation intact distally Psychiatric: Patient is competent for consent with normal mood and affect Lymphatic: No axillary or cervical lymphadenopathy  MUSCULOSKELETAL:  R shoulder has significant ecchymosis over the clavicle.  Slight deformity noted over clavicle.  No tenting of the skin.  Tender to palpation.  Limited ROM of shoulder due to pain.  Sensation intact with 2+ distal pulses.  L hip has ecchymosis and mild swelling.  Tender to palpation over the greater troch.  Pain with log roll of the hip.  Sensation intact with 2+ distal pulses.   Other extremities are atraumatic with painless ROM and NVI.  Assessment: R proximal and distal clavicle fractures R distal acromion fracture R 2nd/3rd/4th rib fractures L greater trochanteric hip fracture  Plan: Discussed all imaging findings with the family.  Recommending non-op management of all fractures at this time.  Would like to get an upright clavicle image when the patient can tolerate sitting.  Will plan to be NWB in the RUE in the sling when out of bed.  TDWB in the LLE.  Patient will likely require placement at discharge due to limited mobility while multiple fractures heal.  Will plan  on follow up of all fractures in our outpatient clinic in 2 weeks.     [facility] [phone]   09/07/2015 5:46 PM

## 2015-09-07 NOTE — ED Notes (Signed)
Pt arrives via [facility] from [facility]. Pt was found on his bedroom floor this am by staff, ems reports staff stated it appeared patient had fallen sometime during the night in his bathroom when trying to stand with his walker and crawled to his bedroom. Pt has bruising and obvious deformity to right collar bone and rt frontal forehead laceration with bleeding controlled with dressing placed by ems. Pt alert to person, not alert to situation. EMS reports staff unsure of what meds patient is taking or if he has hx of memory loss.

## 2015-09-07 NOTE — ED Notes (Signed)
Patient transported to CT 

## 2015-09-07 NOTE — H&P (Signed)
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 -Chart review indicates follow-up with cardiology in September of this year. Note confirms not on chronic anticoagulation and no recent episodes of A. Fib  #3. Systolic murmur. Etiology note dated September 20 indicates likley benign  #4. Hypertension fair control in ED -Pressure high-end of normal -Home medications include Cardizem  #5. Pneumothorax -On right 15%. Trauma monitoring -Oxygen saturation levels greater than 90%  #6 hip fracture/pelvic fracture -Pain management -The pubic consult pending -Keep him nothing by mouth until evaluated by orthopedics -Physical therapy when appropriate  #7 right frontal laceration -Care by emergency room physician  #8. CAD status post CABG -He denied chest pain -EKG without acute changes -Initial troponin negative -Cycle and repeat EKG  #9. Free fluid abdomen per CT. Small amount significance unclear -Followed by surgery/trauma  #10. Rib fractures -3 ribs -Pain control -Nebulizers -pulmonary toilet     Code Status: dnr DVT Prophylaxis: Family Communication: daughter at bedside Disposition Plan: back to facility  Time spent: 70 minutes  [facility],Jelan Batterton M [facility]

## 2015-09-07 NOTE — ED Notes (Signed)
Patient transported to X-ray 
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
## 2015-09-07 NOTE — ED Notes (Signed)
Dr. Lindie SpruceWyatt and Dr. Sunnie Nielsenegalado at bedside

## 2015-09-08 ENCOUNTER — Inpatient Hospital Stay (HOSPITAL_COMMUNITY): Payer: Medicare Other

## 2015-09-08 DIAGNOSIS — I480 Paroxysmal atrial fibrillation: Secondary | ICD-10-CM

## 2015-09-08 DIAGNOSIS — R55 Syncope and collapse: Principal | ICD-10-CM

## 2015-09-08 DIAGNOSIS — I10 Essential (primary) hypertension: Secondary | ICD-10-CM

## 2015-09-08 DIAGNOSIS — J939 Pneumothorax, unspecified: Secondary | ICD-10-CM

## 2015-09-08 DIAGNOSIS — S32509A Unspecified fracture of unspecified pubis, initial encounter for closed fracture: Secondary | ICD-10-CM | POA: Diagnosis present

## 2015-09-08 DIAGNOSIS — S42011G Anterior displaced fracture of sternal end of right clavicle, subsequent encounter for fracture with delayed healing: Secondary | ICD-10-CM

## 2015-09-08 DIAGNOSIS — S2231XD Fracture of one rib, right side, subsequent encounter for fracture with routine healing: Secondary | ICD-10-CM

## 2015-09-08 DIAGNOSIS — S72001D Fracture of unspecified part of neck of right femur, subsequent encounter for closed fracture with routine healing: Secondary | ICD-10-CM

## 2015-09-08 DIAGNOSIS — S42013G Anterior displaced fracture of sternal end of unspecified clavicle, subsequent encounter for fracture with delayed healing: Secondary | ICD-10-CM | POA: Diagnosis present

## 2015-09-08 DIAGNOSIS — Z951 Presence of aortocoronary bypass graft: Secondary | ICD-10-CM

## 2015-09-08 LAB — CBC
HCT: 31.2 % — ABNORMAL LOW (ref 39.0–52.0)
HEMOGLOBIN: 10.2 g/dL — AB (ref 13.0–17.0)
MCH: 31.2 pg (ref 26.0–34.0)
MCHC: 32.7 g/dL (ref 30.0–36.0)
MCV: 95.4 fL (ref 78.0–100.0)
PLATELETS: 189 10*3/uL (ref 150–400)
RBC: 3.27 MIL/uL — AB (ref 4.22–5.81)
RDW: 14.2 % (ref 11.5–15.5)
WBC: 5 10*3/uL (ref 4.0–10.5)

## 2015-09-08 LAB — BASIC METABOLIC PANEL
ANION GAP: 8 (ref 5–15)
BUN: 7 mg/dL (ref 6–20)
CHLORIDE: 103 mmol/L (ref 101–111)
CO2: 30 mmol/L (ref 22–32)
CREATININE: 0.82 mg/dL (ref 0.61–1.24)
Calcium: 8.9 mg/dL (ref 8.9–10.3)
GFR calc non Af Amer: >60 mL/min (ref >60)
Glucose, Bld: 100 mg/dL — ABNORMAL HIGH (ref 65–99)
POTASSIUM: 3.5 mmol/L (ref 3.5–5.1)
SODIUM: 141 mmol/L (ref 135–145)

## 2015-09-08 LAB — TROPONIN I: TROPONIN I: 0.09 ng/mL — AB (ref <0.031)

## 2015-09-08 LAB — GLUCOSE, CAPILLARY: GLUCOSE-CAPILLARY: 98 mg/dL (ref 65–99)

## 2015-09-08 LAB — PROTIME-INR
INR: 1.12 (ref 0.00–1.49)
Prothrombin Time: 14.6 seconds (ref 11.6–15.2)

## 2015-09-08 MED ORDER — CETYLPYRIDINIUM CHLORIDE 0.05 % MT LIQD
7.0000 mL | Freq: Two times a day (BID) | OROMUCOSAL | Status: DC
  Administered 2015-09-08 – 2015-09-12 (×9): 7 mL via OROMUCOSAL

## 2015-09-08 MED ORDER — FENTANYL CITRATE (PF) 100 MCG/2ML IJ SOLN
12.5000 ug | INTRAMUSCULAR | Status: DC | PRN
  Administered 2015-09-08: 12.5 ug via INTRAVENOUS
  Administered 2015-09-08: 25 ug via INTRAVENOUS
  Filled 2015-09-08 (×2): qty 2

## 2015-09-08 MED ORDER — MAGNESIUM SULFATE 2 GM/50ML IV SOLN
2.0000 g | Freq: Once | INTRAVENOUS | Status: AC
  Administered 2015-09-08: 2 g via INTRAVENOUS
  Filled 2015-09-08: qty 50

## 2015-09-08 NOTE — Consult Note (Signed)
Cardiologist:  Marlou Porch Reason for Consult Syncope, elevated troponin Referring Physician:   Jerl [name] is an 81 y.o. male.  HPI:   Kristopher [name] is a 81 y.o. 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  Daughter used to work at [employer], recruiting.  He has a history of paroxysmal atrial fibrillation, CAD status post 1995 CABG in [place].  He was hospitalized 4/15 with severe GI bleed, hemoglobin 4.  His Eliquis was then restarted but one year later had another GI bleed. He has been off of anticoagulation since then. No longer taking aspirin. He has been stable.  EKG 06/14/15 showed sinus rhythm with heart rate of 63 beats per minute.  He used to see a cardiologist in [place] once a year. He remembers being on Coumadin. He thinks that he may have been diagnosed in 2000 with atrial fibrillation.   He was seen in the emergency room in Sept with nausea and vomiting. GI virus. Mild fever. He received IV fluids at [facility] stone as well.  The patient presents after sustaining a fall from a syncopal episode.  The event was unwitnessed.  The fall resulted in 3 rib fractures on the right(2, 3 ,4), right frontal lobe laceration, pneumothorax, R prox and distal clavical fracture, R distal acromion fracture, L greater trochanteric fracture..  Troponin is 0.09.  Echocardiogram is in process.  The patient is pleasant.  Denies any pain or problems.  He does not know where he is.     Past Medical History  Diagnosis Date  . Atrial fibrillation ([place])   . Hx of CABG   . Hypertension   . DJD (degenerative joint disease), cervical   . Colon polyps   . Gout   . BPH (benign prostatic hyperplasia)   . Stroke [facility])     "light stroke" per daughter    Past Surgical History  Procedure Laterality Date  . Coronary artery bypass graft    . Appendectomy    . Tonsillectomy    . Colonoscopy    . Upper gastrointestinal endoscopy    . Esophagogastroduodenoscopy N/A 12/20/2013     Procedure: ESOPHAGOGASTRODUODENOSCOPY (EGD);  Surgeon: Gatha Mayer, MD;  Location: [facility] ENDOSCOPY;  Service: Endoscopy;  Laterality: N/A;  might be bedside  . Colonoscopy N/A 12/22/2013    Procedure: COLONOSCOPY;  Surgeon: Milus Banister, MD;  Location: [place];  Service: Endoscopy;  Laterality: N/A;  . Givens capsule study N/A 12/22/2013    Procedure: GIVENS CAPSULE STUDY;  Surgeon: Milus Banister, MD;  Location: [place];  Service: Endoscopy;  Laterality: N/A;  . Eye muscle surgery      Family History:  Unable to obtain due to patient's mental status.   Social History:  reports that he has never smoked. He has never used smokeless tobacco. He reports that he does not drink alcohol or use illicit drugs.  Allergies:  Allergies  Allergen Reactions  . Codeine     unknown  . Other Other (See Comments)    All pain medications cause a hangover effect  . Sulfa Antibiotics     unknown    Medications: Scheduled Meds: . antiseptic oral rinse  7 mL Mouth Rinse BID  . diltiazem  180 mg Oral Daily  . ipratropium-albuterol  3 mL Nebulization BID  . sodium chloride  3 mL Intravenous Q12H   Continuous Infusions:  PRN Meds:.acetaminophen **OR** acetaminophen, fentaNYL (SUBLIMAZE) injection, levalbuterol, ondansetron **OR** ondansetron (  ZOFRAN) IV   Results for orders placed or performed during the hospital encounter of 09/07/15 (from the past 48 hour(s))  Urinalysis, Routine w reflex microscopic     Status: Abnormal   Collection Time: 09/07/15 11:16 AM  Result Value Ref Range   Color, Urine YELLOW YELLOW   APPearance CLEAR CLEAR   Specific Gravity, Urine 1.010 1.005 - 1.030   pH 7.5 5.0 - 8.0   Glucose, UA NEGATIVE NEGATIVE mg/dL   Hgb urine dipstick TRACE (A) NEGATIVE   Bilirubin Urine NEGATIVE NEGATIVE   Ketones, ur NEGATIVE NEGATIVE mg/dL   Protein, ur NEGATIVE NEGATIVE mg/dL   Nitrite NEGATIVE NEGATIVE   Leukocytes, UA NEGATIVE NEGATIVE  Urine microscopic-add on      Status: Abnormal   Collection Time: 09/07/15 11:16 AM  Result Value Ref Range   Squamous Epithelial / LPF 0-5 (A) NONE SEEN   WBC, UA 0-5 0 - 5 WBC/hpf   RBC / HPF 0-5 0 - 5 RBC/hpf   Bacteria, UA RARE (A) NONE SEEN  CBG monitoring, ED     Status: Abnormal   Collection Time: 09/07/15 11:25 AM  Result Value Ref Range   Glucose-Capillary 122 (H) 65 - 99 mg/dL  I-stat troponin, ED     Status: None   Collection Time: 09/07/15 11:43 AM  Result Value Ref Range   Troponin i, poc 0.04 0.00 - 0.08 ng/mL   Comment 3            Comment: Due to the release kinetics of cTnI, a negative result within the first hours of the onset of symptoms does not rule out myocardial infarction with certainty. If myocardial infarction is still suspected, repeat the test at appropriate intervals.   I-Stat CG4 Lactic Acid, ED     Status: None   Collection Time: 09/07/15 11:45 AM  Result Value Ref Range   Lactic Acid, Venous 1.40 0.5 - 2.0 mmol/L  CBC with Differential     Status: Abnormal   Collection Time: 09/07/15 11:50 AM  Result Value Ref Range   WBC 7.1 4.0 - 10.5 K/uL   RBC 3.39 (L) 4.22 - 5.81 MIL/uL   Hemoglobin 10.7 (L) 13.0 - 17.0 g/dL   HCT 32.2 (L) 39.0 - 52.0 %   MCV 95.0 78.0 - 100.0 fL   MCH 31.6 26.0 - 34.0 pg   MCHC 33.2 30.0 - 36.0 g/dL   RDW 14.0 11.5 - 15.5 %   Platelets 201 150 - 400 K/uL   Neutrophils Relative % 90 %   Neutro Abs 6.3 1.7 - 7.7 K/uL   Lymphocytes Relative 6 %   Lymphs Abs 0.4 (L) 0.7 - 4.0 K/uL   Monocytes Relative 4 %   Monocytes Absolute 0.3 0.1 - 1.0 K/uL   Eosinophils Relative 0 %   Eosinophils Absolute 0.0 0.0 - 0.7 K/uL   Basophils Relative 0 %   Basophils Absolute 0.0 0.0 - 0.1 K/uL  Comprehensive metabolic panel     Status: Abnormal   Collection Time: 09/07/15 11:50 AM  Result Value Ref Range   Sodium 139 135 - 145 mmol/L   Potassium 3.5 3.5 - 5.1 mmol/L   Chloride 104 101 - 111 mmol/L   CO2 28 22 - 32 mmol/L   Glucose, Bld 132 (H) 65 - 99 mg/dL     BUN 8 6 - 20 mg/dL   Creatinine, Ser 0.74 0.61 - 1.24 mg/dL   Calcium 8.9 8.9 - 10.3 mg/dL   Total Protein  5.8 (L) 6.5 - 8.1 g/dL   Albumin 3.3 (L) 3.5 - 5.0 g/dL   AST 28 15 - 41 U/L   ALT 21 17 - 63 U/L   Alkaline Phosphatase 130 (H) 38 - 126 U/L   Total Bilirubin 1.2 0.3 - 1.2 mg/dL   GFR calc non Af Amer >60 >60 mL/min   GFR calc Af Amer >60 >60 mL/min    Comment: (NOTE) The eGFR has been calculated using the CKD EPI equation. This calculation has not been validated in all clinical situations. eGFR's persistently <60 mL/min signify possible Chronic Kidney Disease.    Anion gap 7 5 - 15  CK     Status: None   Collection Time: 09/07/15  3:36 PM  Result Value Ref Range   Total CK 234 49 - 397 U/L  Magnesium     Status: Abnormal   Collection Time: 09/07/15  9:41 PM  Result Value Ref Range   Magnesium 1.6 (L) 1.7 - 2.4 mg/dL  TSH     Status: None   Collection Time: 09/07/15  9:41 PM  Result Value Ref Range   TSH 0.785 0.350 - 4.500 uIU/mL  Protime-INR     Status: None   Collection Time: 09/07/15  9:41 PM  Result Value Ref Range   Prothrombin Time 15.0 11.6 - 15.2 seconds   INR 1.16 0.00 - 1.49  Troponin I     Status: Abnormal   Collection Time: 09/07/15  9:41 PM  Result Value Ref Range   Troponin I 0.06 (H) <0.031 ng/mL    Comment:        PERSISTENTLY INCREASED TROPONIN VALUES IN THE RANGE OF 0.04-0.49 ng/mL CAN BE SEEN IN:       -UNSTABLE ANGINA       -CONGESTIVE HEART FAILURE       -MYOCARDITIS       -CHEST TRAUMA       -ARRYHTHMIAS       -LATE PRESENTING MYOCARDIAL INFARCTION       -COPD   CLINICAL FOLLOW-UP RECOMMENDED.   Basic metabolic panel     Status: Abnormal   Collection Time: 09/08/15  5:00 AM  Result Value Ref Range   Sodium 141 135 - 145 mmol/L   Potassium 3.5 3.5 - 5.1 mmol/L   Chloride 103 101 - 111 mmol/L   CO2 30 22 - 32 mmol/L   Glucose, Bld 100 (H) 65 - 99 mg/dL   BUN 7 6 - 20 mg/dL   Creatinine, Ser 0.82 0.61 - 1.24 mg/dL   Calcium  8.9 8.9 - 10.3 mg/dL   GFR calc non Af Amer >60 >60 mL/min   GFR calc Af Amer >60 >60 mL/min    Comment: (NOTE) The eGFR has been calculated using the CKD EPI equation. This calculation has not been validated in all clinical situations. eGFR's persistently <60 mL/min signify possible Chronic Kidney Disease.    Anion gap 8 5 - 15  CBC     Status: Abnormal   Collection Time: 09/08/15  5:00 AM  Result Value Ref Range   WBC 5.0 4.0 - 10.5 K/uL   RBC 3.27 (L) 4.22 - 5.81 MIL/uL   Hemoglobin 10.2 (L) 13.0 - 17.0 g/dL   HCT 31.2 (L) 39.0 - 52.0 %   MCV 95.4 78.0 - 100.0 fL   MCH 31.2 26.0 - 34.0 pg   MCHC 32.7 30.0 - 36.0 g/dL   RDW 14.2 11.5 - 15.5 %   Platelets 189  150 - 400 K/uL  Protime-INR     Status: None   Collection Time: 09/08/15  5:00 AM  Result Value Ref Range   Prothrombin Time 14.6 11.6 - 15.2 seconds   INR 1.12 0.00 - 1.49  Troponin I     Status: Abnormal   Collection Time: 09/08/15  5:00 AM  Result Value Ref Range   Troponin I 0.09 (H) <0.031 ng/mL    Comment:        PERSISTENTLY INCREASED TROPONIN VALUES IN THE RANGE OF 0.04-0.49 ng/mL CAN BE SEEN IN:       -UNSTABLE ANGINA       -CONGESTIVE HEART FAILURE       -MYOCARDITIS       -CHEST TRAUMA       -ARRYHTHMIAS       -LATE PRESENTING MYOCARDIAL INFARCTION       -COPD   CLINICAL FOLLOW-UP RECOMMENDED.   Glucose, capillary     Status: None   Collection Time: 09/08/15  7:22 AM  Result Value Ref Range   Glucose-Capillary 98 65 - 99 mg/dL    Dg Chest 1 View  09/07/2015  CLINICAL DATA:  Fall. EXAM: CHEST 1 VIEW COMPARISON:  December 19, 2013. FINDINGS: The heart size and mediastinal contours are within normal limits. Minimal right apical pneumothorax is noted. Moderately displaced fracture is seen involving the lateral portion of the right fourth rib. Left lung is clear. No significant pleural effusion is noted. Mildly displaced distal right clavicular fracture is noted, as well as mildly displaced fracture  involving the acromion. Atherosclerosis of thoracic aorta is noted. IMPRESSION: Mildly displaced fractures are seen involving the distal right clavicle and acromion. Moderately displaced fracture involving lateral portion of right fourth rib. Minimal right apical pneumothorax is noted. Critical Value/emergent results were called by telephone at the time of interpretation on 09/07/2015 at 12:26 pm to Dr. Quintella Reichert , who verbally acknowledged these results. Electronically Signed   By: Marijo Conception, M.D.   On: 09/07/2015 12:27   Dg Clavicle Right  09/08/2015  CLINICAL DATA:  Clavicle fracture. EXAM: RIGHT CLAVICLE - 2+ VIEWS COMPARISON:  09/08/2015. FINDINGS: Slightly displaced fracture of the distal clavicle is noted. Fracture of the acromion process is noted.Moderate size right apical pneumothorax noted. Displaced fracture of the posterior lateral aspect of the right fourth rib noted. Prior median sternotomy . IMPRESSION: 1. Slightly displaced fracture of the distal clavicle. 2.  Fracture of the acromion process noted. 3. Displaced fracture of the posterior lateral aspect of the right fourth rib noted. 4.  Moderate size right apical pneumothorax. Critical Value/emergent results were called by telephone at the time of interpretation on 09/08/2015 at 8:06 am to [place] who verbally acknowledged these results. Electronically Signed   By: Marcello Moores  Register   On: 09/08/2015 08:10   Ct Head Wo Contrast  09/07/2015  CLINICAL DATA:  Found laying on bathroom floor, laceration along the right forehead. Fall. EXAM: CT HEAD WITHOUT CONTRAST CT CERVICAL SPINE WITHOUT CONTRAST TECHNIQUE: Multidetector CT imaging of the head and cervical spine was performed following the standard protocol without intravenous contrast. Multiplanar CT image reconstructions of the cervical spine were also generated. COMPARISON:  09/06/2015 FINDINGS: CT HEAD FINDINGS The brainstem, cerebellum, cerebral peduncles, thalami, basal  ganglia, basilar cisterns, and ventricular system appear within normal limits. Periventricular white matter and corona radiata hypodensities favor chronic ischemic microvascular white matter disease. No intracranial hemorrhage, mass lesion, or acute CVA. Right frontal scalp laceration. Acute on  chronic right maxillary sinusitis with chronic ethmoid and chronic left maxillary sinusitis. There is atherosclerotic calcification of the cavernous carotid arteries bilaterally. CT CERVICAL SPINE FINDINGS Calcified pannus posterior to the odontoid. Similar appearance of 3 mm degenerative retrolisthesis at C4-5. 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 The left C2- 3 and C3-4 facet joints are fused. There is some bridging interbody spurring at C3-4. Degenerative endplate sclerosis at E0-3 and C6-7 both with posterior osseous ridging. No cervical spine fracture or acute subluxation is identified. There is evidence of moderate central narrowing of the thecal sac at C4-5 due to subluxation and spurring. Osseous right foraminal stenosis is severe at C4-5 and mild to moderate at C3-4. Spurring causes left foraminal stenosis at C3-4, C4-5, and C6-7. No prevertebral soft tissue swelling. There is a small right apical pneumothorax. Fracture the right second rib medially, observed on image 20 series 8. Displaced medial clavicular fracture on the right. Based on conventional radiography today the patient is also thought to have lateral clavicular fracture on the right as well as a lateral acromial fracture. IMPRESSION: 1. Small right apical pneumothorax with fracture of the right second rib medially. Right medial clavicular fracture. Based on today's conventional radiographs the patient is also thought to have a right lateral clavicular fracture as well as other right rib fractures including the right third and fourth ribs. 2. Right frontal scalp laceration.  No acute intracranial findings. 3. Cervical  spondylosis and degenerative disc disease with multilevel impingement, but no acute cervical spine fracture. These results were called by telephone at the time of interpretation on 09/07/2015 at 12:12 pm to Dr. Quintella Reichert , who verbally acknowledged these results. Electronically Signed   By: Van Clines M.D.   On: 09/07/2015 12:15   Ct Chest W Contrast  09/07/2015  CLINICAL DATA:  [place] today.  Rib fractures and pneumothorax. EXAM: CT CHEST, ABDOMEN, AND PELVIS WITH CONTRAST TECHNIQUE: Multidetector CT imaging of the chest, abdomen and pelvis was performed following the standard protocol during bolus administration of intravenous contrast. CONTRAST:  8m OMNIPAQUE IOHEXOL 300 MG/ML  SOLN COMPARISON:  Chest x-ray 09/07/2015 and CT abdomen/ pelvis 01/10/2014 FINDINGS: CT CHEST FINDINGS Mediastinum/Lymph Nodes: Right-sided chest wall edema/ hematoma likely related to right clavicle fracture. No chest wall mass, supraclavicular or axillary adenopathy. The thyroid gland is grossly normal. The major vascular structures are patent. The heart is normal in size for age. No pericardial effusion. Moderate tortuosity, ectasia and calcification of the thoracic aorta but no focal aneurysm or dissection. Three-vessel coronary artery calcifications are noted. Small amount of probable mediastinal hematoma likely from the displace clavicle fracture near the sternoclavicular joint. The pulmonary arteries appear normal. The esophagus is grossly normal. Lungs/Pleura: There is a moderate-sized anterior pneumothorax estimated at 15%. There are also small pleural effusions and bibasilar atelectasis. No pulmonary contusion or worrisome pulmonary lesions. Musculoskeletal: Displaced clavicle fracture noted at the sternoclavicular joint. The anterior second and third ribs are fractured. There is also a fourth rib fracture laterally. No left-sided rib fractures are identified. The vertebral bodies are intact. No sternal fracture.  Sternal wires related to prior bypass surgery. CT ABDOMEN PELVIS FINDINGS Hepatobiliary: No focal hepatic hepatic lesions or intrahepatic biliary dilatation. No acute hepatic injury. The gallbladder is mildly distended. No acute inflammation. No common bile duct dilatation. Pancreas: No mass, inflammation or acute injury. Spleen: Intact.  No acute injury. Adrenals/Urinary Tract: The adrenal glands are normal. There are multiple bilateral renal calculi and renal cortical thinning  but no acute renal injury. Stomach/Bowel: The stomach, duodenum, small bowel and colon are grossly normal without oral contrast. Vascular/Lymphatic: Moderate atherosclerotic calcifications involving the aorta and branch vessels but no dissection or focal aneurysm. Fairly significant bilateral renal artery calcifications. Small infrarenal abdominal aortic aneurysm just above the iliac artery bifurcation measuring 3.0 x 3.0 cm. Reproductive: The prostate gland is enlarged. There is significant median lobe hypertrophy projecting well up into the bladder. This appears relatively stable since 2015. Other: Small amount of free pelvic fluid is noted of uncertain significance or etiology. Musculoskeletal: There is a fracture involving the greater trochanter of the left hip. No definite neck or intertrochanteric fracture. There are advanced degenerative changes involving both hips. There is diffuse osteo per [place] and advanced degenerative changes involving the spine. IMPRESSION: 1. Displaced proximal right clavicle fracture near the sternoclavicular joint with moderate surrounding hematoma. 2. Second, third and fourth right rib fractures. 3. Estimated 15% right-sided pneumothorax. No pulmonary contusions. Small effusions and bibasilar atelectasis. 4. No acute solid organ injury is identified in the abdomen. There is free pelvic fluid noted in the right pericolic gutter and pelvis of uncertain etiology or significance. Could not exclude a subtle  bowel injury. No free air is identified. Recommend close clinical follow-up. 5. Numerous bilateral renal calculi. 6. Advanced atherosclerotic calcifications involving the thoracic and abdominal aortas and branch vessels. 7. Significant median lobe hypertrophy of the prostate gland. 8. Comminuted fracture involving the greater trochanter of the left hip. 9. Left inferior pubic ramus fracture. Electronically Signed   By: Marijo Sanes M.D.   On: 09/07/2015 13:15   Ct Cervical Spine Wo Contrast  09/07/2015  CLINICAL DATA:  Found laying on bathroom floor, laceration along the right forehead. Fall. EXAM: CT HEAD WITHOUT CONTRAST CT CERVICAL SPINE WITHOUT CONTRAST TECHNIQUE: Multidetector CT imaging of the head and cervical spine was performed following the standard protocol without intravenous contrast. Multiplanar CT image reconstructions of the cervical spine were also generated. COMPARISON:  09/06/2015 FINDINGS: CT HEAD FINDINGS The brainstem, cerebellum, cerebral peduncles, thalami, basal ganglia, basilar cisterns, and ventricular system appear within normal limits. Periventricular white matter and corona radiata hypodensities favor chronic ischemic microvascular white matter disease. No intracranial hemorrhage, mass lesion, or acute CVA. Right frontal scalp laceration. Acute on chronic right maxillary sinusitis with chronic ethmoid and chronic left maxillary sinusitis. There is atherosclerotic calcification of the cavernous carotid arteries bilaterally. CT CERVICAL SPINE FINDINGS Calcified pannus posterior to the odontoid. Similar appearance of 3 mm degenerative retrolisthesis at C4-5. 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 The left C2- 3 and C3-4 facet joints are fused. There is some bridging interbody spurring at C3-4. Degenerative endplate sclerosis at Z5-6 and C6-7 both with posterior osseous ridging. No cervical spine fracture or acute subluxation is identified. There is evidence  of moderate central narrowing of the thecal sac at C4-5 due to subluxation and spurring. Osseous right foraminal stenosis is severe at C4-5 and mild to moderate at C3-4. Spurring causes left foraminal stenosis at C3-4, C4-5, and C6-7. No prevertebral soft tissue swelling. There is a small right apical pneumothorax. Fracture the right second rib medially, observed on image 20 series 8. Displaced medial clavicular fracture on the right. Based on conventional radiography today the patient is also thought to have lateral clavicular fracture on the right as well as a lateral acromial fracture. IMPRESSION: 1. Small right apical pneumothorax with fracture of the right second rib medially. Right medial clavicular fracture. Based on today's  conventional radiographs the patient is also thought to have a right lateral clavicular fracture as well as other right rib fractures including the right third and fourth ribs. 2. Right frontal scalp laceration.  No acute intracranial findings. 3. Cervical spondylosis and degenerative disc disease with multilevel impingement, but no acute cervical spine fracture. These results were called by telephone at the time of interpretation on 09/07/2015 at 12:12 pm to Dr. Quintella Reichert , who verbally acknowledged these results. Electronically Signed   By: Van Clines M.D.   On: 09/07/2015 12:15   Mr Brain Wo Contrast  09/06/2015  CLINICAL DATA:  Aphasia, memory loss and confusion for 1.5 years increasing in frequency. Stroke risk factors include atrial fibrillation, hypertension, and CABG. Head injury February 2015. EXAM: MRI HEAD WITHOUT CONTRAST TECHNIQUE: Multiplanar, multiecho pulse sequences of the brain and surrounding structures were obtained without intravenous contrast. COMPARISON:  CT head 12/19/2013. FINDINGS: No evidence for acute infarction, hemorrhage, mass lesion, or extra-axial fluid. Advanced cerebral and cerebellar atrophy. Hydrocephalus ex vacuo. Mild to moderate T2  and FLAIR hyperintensities throughout periventricular and subcortical white matter representing chronic microvascular ischemic change. Remote LEFT parietal and LEFT temporal cortical and subcortical infarcts. Dolichoectatic but widely patent cerebral vasculature. Cervical spondylosis with suspected spinal stenosis at C3-4 and C4-5. Unremarkable pituitary and cerebellar tonsils. No osseous findings. Extracranial soft tissues unremarkable. Chronic RIGHT maxillary sinus disease with retention cyst formation. BILATERAL cataract extraction appears uncomplicated. Compared with the prior CT in 2015, progression of atrophy is noted. The chronic areas of cortical infarction were observed on the scan, but there is increasing diffuse cortical volume loss. IMPRESSION: No acute intracranial findings. Advanced atrophy with mild to moderate small vessel disease. Remote LEFT parietal and LEFT temporal infarcts. Cervical spondylosis. Electronically Signed   By: Staci Righter M.D.   On: 09/06/2015 13:42   Ct Abdomen Pelvis W Contrast  09/07/2015  CLINICAL DATA:  [place] today.  Rib fractures and pneumothorax. EXAM: CT CHEST, ABDOMEN, AND PELVIS WITH CONTRAST TECHNIQUE: Multidetector CT imaging of the chest, abdomen and pelvis was performed following the standard protocol during bolus administration of intravenous contrast. CONTRAST:  23m OMNIPAQUE IOHEXOL 300 MG/ML  SOLN COMPARISON:  Chest x-ray 09/07/2015 and CT abdomen/ pelvis 01/10/2014 FINDINGS: CT CHEST FINDINGS Mediastinum/Lymph Nodes: Right-sided chest wall edema/ hematoma likely related to right clavicle fracture. No chest wall mass, supraclavicular or axillary adenopathy. The thyroid gland is grossly normal. The major vascular structures are patent. The heart is normal in size for age. No pericardial effusion. Moderate tortuosity, ectasia and calcification of the thoracic aorta but no focal aneurysm or dissection. Three-vessel coronary artery calcifications are noted.  Small amount of probable mediastinal hematoma likely from the displace clavicle fracture near the sternoclavicular joint. The pulmonary arteries appear normal. The esophagus is grossly normal. Lungs/Pleura: There is a moderate-sized anterior pneumothorax estimated at 15%. There are also small pleural effusions and bibasilar atelectasis. No pulmonary contusion or worrisome pulmonary lesions. Musculoskeletal: Displaced clavicle fracture noted at the sternoclavicular joint. The anterior second and third ribs are fractured. There is also a fourth rib fracture laterally. No left-sided rib fractures are identified. The vertebral bodies are intact. No sternal fracture. Sternal wires related to prior bypass surgery. CT ABDOMEN PELVIS FINDINGS Hepatobiliary: No focal hepatic hepatic lesions or intrahepatic biliary dilatation. No acute hepatic injury. The gallbladder is mildly distended. No acute inflammation. No common bile duct dilatation. Pancreas: No mass, inflammation or acute injury. Spleen: Intact.  No acute injury. Adrenals/Urinary Tract: The adrenal  glands are normal. There are multiple bilateral renal calculi and renal cortical thinning but no acute renal injury. Stomach/Bowel: The stomach, duodenum, small bowel and colon are grossly normal without oral contrast. Vascular/Lymphatic: Moderate atherosclerotic calcifications involving the aorta and branch vessels but no dissection or focal aneurysm. Fairly significant bilateral renal artery calcifications. Small infrarenal abdominal aortic aneurysm just above the iliac artery bifurcation measuring 3.0 x 3.0 cm. Reproductive: The prostate gland is enlarged. There is significant median lobe hypertrophy projecting well up into the bladder. This appears relatively stable since 2015. Other: Small amount of free pelvic fluid is noted of uncertain significance or etiology. Musculoskeletal: There is a fracture involving the greater trochanter of the left hip. No definite neck  or intertrochanteric fracture. There are advanced degenerative changes involving both hips. There is diffuse osteo per [facility] and advanced degenerative changes involving the spine. IMPRESSION: 1. Displaced proximal right clavicle fracture near the sternoclavicular joint with moderate surrounding hematoma. 2. Second, third and fourth right rib fractures. 3. Estimated 15% right-sided pneumothorax. No pulmonary contusions. Small effusions and bibasilar atelectasis. 4. No acute solid organ injury is identified in the abdomen. There is free pelvic fluid noted in the right pericolic gutter and pelvis of uncertain etiology or significance. Could not exclude a subtle bowel injury. No free air is identified. Recommend close clinical follow-up. 5. Numerous bilateral renal calculi. 6. Advanced atherosclerotic calcifications involving the thoracic and abdominal aortas and branch vessels. 7. Significant median lobe hypertrophy of the prostate gland. 8. Comminuted fracture involving the greater trochanter of the left hip. 9. Left inferior pubic ramus fracture. Electronically Signed   By: Marijo Sanes M.D.   On: 09/07/2015 13:15   Dg Chest Port 1 View  09/08/2015  CLINICAL DATA:  Pneumothorax EXAM: PORTABLE CHEST 1 VIEW COMPARISON:  09/07/2015 FINDINGS: Grossly unchanged enlarged cardiac silhouette and mediastinal contours post median sternotomy and CABG. Atherosclerotic plaque with a tortuous thoracic aorta. Worsening right medial basilar heterogeneous opacities. No pleural effusion. Interval increase in size of small right sided pneumothorax. No definitive mediastinal shift. No evidence of edema. Unchanged bones including displaced right clavicular, acromion and right lateral rib fractures, incompletely evaluated. IMPRESSION: 1. Interval increase in size of small right-sided pneumothorax. No definite evidence of tension physiology. Continued attention on follow-up is recommended. 2. Worsening right medial basilar  heterogeneous opacities favored to represent atelectasis versus contusion. These results will be called to the ordering clinician or representative by the Radiologist Assistant, and communication documented in the PACS or z[facility] Dashboard. Electronically Signed   By: Sandi Mariscal M.D.   On: 09/08/2015 07:31   Dg Hip Unilat With Pelvis 2-3 Views Left  09/07/2015  CLINICAL DATA:  Found down today. Possibly fell some time during the night. EXAM: DG HIP (WITH OR WITHOUT PELVIS) 2-3V LEFT COMPARISON:  08/06/2015 FINDINGS: Severe left hip joint degenerative changes are again demonstrated. No obvious acute fracture. The right hip is normally located. No definite fracture. The pubic symphysis and SI joints are intact. No definite pelvic fractures. IMPRESSION: No acute hip or pelvic fractures are identified. Severe left hip joint degenerative changes. Electronically Signed   By: Marijo Sanes M.D.   On: 09/07/2015 14:05    Review of Systems  Unable to perform ROS: dementia  All other systems reviewed and are negative.  Blood pressure 114/68, pulse 75, temperature 98.6 F (37 C), temperature source Oral, resp. rate 16, weight 122 lb 14.4 oz (55.747 kg), SpO2 100 %. Physical Exam  Nursing note and  vitals reviewed. Constitutional: He appears well-developed. No distress.  Thin  HENT:  Laceration to the left frontal lobe.  Eyes: EOM are normal. No scleral icterus.  Pupils: ~3m   Neck: Normal range of motion.  Cardiovascular: Normal rate, regular rhythm, S1 normal and S2 normal.   No murmur heard. Pulses:      Radial pulses are 2+ on the right side, and 2+ on the left side.       Dorsalis pedis pulses are 2+ on the right side, and 2+ on the left side.  Respiratory: Effort normal and breath sounds normal. He has no wheezes. He has no rales. He exhibits no tenderness.  GI: Soft. Bowel sounds are normal. He exhibits no distension. There is no tenderness.  + abdominal bruit, RLQ down to femoral artery    Musculoskeletal: He exhibits no edema.  Neurological: He is alert.  Not oriented.   Skin: Skin is warm and dry.  Psychiatric: He has a normal mood and affect.  Very pleasant    Assessment/Plan:    Syncope and collapse   History of coronary artery bypass graft   Paroxysmal atrial fibrillation (HCC)   Systolic murmur   Pneumothorax   Rib fracture   Hip fracture (HCC)   Pelvic fracture (HCC)   Hypertension   Anemia   Fall  81 y.o. Male who has a history of paroxysmal atrial fibrillation, CAD status post [place].   His CHADSVASC is 6.  Not on anticoagulation due to multiple GI bleeds.  He presented after sustaining a fall from a syncopal episode.  The event was unwitnessed.  The fall resulted in 3 rib fractures on the right(2, 3 ,4), right frontal lobe laceration, pneumothorax, R prox and distal clavical fracture, R distal acromion fracture, L greater trochanteric fracture.  Non-op management per ortho.  Troponin is 0.09.  No EKG changes to suggest ischemia.  He is in NSR.  We will review echocardiogram which has not been read yet.  BP controlled at the moment.  Continue to monitor on telemetry.   Consider outpatient cardiac monitoring however, given overall status, he is not a candidate for pacemaker if something were found on a monitor.      HTarri Fuller PHartwell12/15/2016, 11:06 AM     I have examined the patient and reviewed assessment and plan and discussed with patient.  Agree with above as stated.  Would not pursue any further cardiac testing at this time.  No obvious arrhtyhmias on telemetry.  I don't think he would be a good candidate for a pacemaker even if there was bradycardia found, given his dementia.  Would manage him symptomatically at this time.  Maintaining NSR.  I agree that he is not a good candidate for anticoagulation despite his AFib.  Lamone Ferrelli S.

## 2015-09-08 NOTE — Progress Notes (Signed)
Xray report called from this am, showed increased right side pneumothorax, paged Dr. Gonzella Lexhungel with results. Will continue to monitor patient.

## 2015-09-08 NOTE — Progress Notes (Signed)
EEG Completed; Results Pending  

## 2015-09-08 NOTE — Progress Notes (Signed)
[facility] PROGRESS NOTE  Kristopher [name] UJW:[id number] DOB: [birth year] DOA: 09/07/2015 PCP: Ginette OttoSTONEKING,HAL THOMAS, MD  Brief narrative   81 year old male with history of A. fib (off anticoagulation due to multiple GI bleeds), CAD with history of CABG, AVMs, diabetes mellitus, hypertension, BPH, prior history of stroke with progressive decline in memory for the past few months had unwitnessed syncope at home and was found crawling out in his own from the bathroom by the caregiver. Patient brought to the ED and found to have displaced fracture of his right anterior clavicle with 3 right rib fractures, pneumothorax and right greater trochanter  fracture with laceration to the scalp. Trauma surgery was consulted who recommended admission to medical service and will continue to follow. Patient admitted to stepdown unit for syncope workup. Had mildly elevated troponin is well.  Assessment/Plan: Syncope with collapse Cardiac versus neurogenic. MRI done as outpatient on 12/13 for gradual decline in his impairment in memory which showed no acute findings with advanced atrophy. -Patient has paroxysmal A. fib on monitor. Mildly elevated troponin. TSH normal. Check 2-D echo and EEG. -Patient is lethargic on my evaluation this morning secondary to pain medications. I have minimized his pain medications. -Trauma surgery and cardiology following. -Cardiology recommend no further cardiac testing at this time. He is not a candidate for pacemaker given his worsening dementia and also not a candidate for anticoagulation given a GI bleed. -Continue neuro checks. Swallow evaluation once patient more awake. PT evaluation once tolerated. -Prognosis is guarded.  Right-sided traumatic pneumothorax Maintaining O2 sat .90% on 2 L via nasal cannula. Pneumothorax worsened on follow-up x-ray this morning however given hemodynamic stability no intervention required at this time. Trauma surgery following. Will follow-up  with repeat chest x-ray in a.m.  Right anterior displaced clavicular fracture and multiple right rib fractures (2nd. 3rd and 4th) Seen by orthopedics and recommended nonoperative management with sling and non weightbearing -Pain control, nebs and bedside spirometry for a fractures.  Left inferior ramus and left comminuted greater trochanteric fracture  Nonoperative management.  CAD status post CABG Not on any medications. [place] in [place] once a year. Has [place] in [place].  Progressive dementia Being worked up as outpatient. Recent MRI unremarkable except for significant atrophy.  Paroxysmal A. fib Has high chads vasc score but not an anticoagulation given history of significant GI bleed in the past. Rate control with Cardizem.  DVT prophylaxis: SCDs and slight GI prophylaxis: PPI    Code Status: DO NOT RESUSCITATE Family Communication: Daughter at bedside Disposition Plan: Continue step down monitoring. Will need skilled nursing facility once improved   Consultants:  Trauma surgery  Cardiology  Procedures:  CT head and cervical spine  CT chest, abdomen and pelvis  Antibiotics:  None  HPI/Subjective: Seen and examined. Admission H&P reviewed. Patient sleepy after receiving narcotics.  Objective: Filed Vitals:   09/08/15 0545 09/08/15 1450  BP: 114/68 151/73  Pulse: 75 79  Temp: 98.6 F (37 C) 98.6 F (37 C)  Resp: 16     Intake/Output Summary (Last 24 hours) at 09/08/15 1451 Last data filed at 09/08/15 1230  Gross per 24 hour  Intake    400 ml  Output    600 ml  Net   -200 ml   Filed Weights   09/07/15 1900 09/08/15 0545  Weight: 59.013 kg (130 lb 1.6 oz) 55.747 kg (122 lb 14.4 oz)    Exam:   General: Elderly male lying in bed fatigue  and lethargic  HEENT: Right frontal laceration, No pallor, dry oral mucosa, supple neck  Chest: Ecchymosis with swelling over anterior left right clavicle with  ecchymosis over right lateral chest, diminished breath sounds in the right lung   Cardiovascular: Normal S1 and S2 with 3/6 systolic murmur, no rubs or gallop  GI: Soft, nondistended, nontender, bowel sounds present  Musculoskeletal: Warm, no edema  CNS: Sleepy and poorly arousable   Data Reviewed: Basic Metabolic Panel:  Recent Labs Lab 09/07/15 1150 09/07/15 2141 09/08/15 0500  NA 139  --  141  K 3.5  --  3.5  CL 104  --  103  CO2 28  --  30  GLUCOSE 132*  --  100*  BUN 8  --  7  CREATININE 0.74  --  0.82  CALCIUM 8.9  --  8.9  MG  --  1.6*  --    Liver Function Tests:  Recent Labs Lab 09/07/15 1150  AST 28  ALT 21  ALKPHOS 130*  BILITOT 1.2  PROT 5.8*  ALBUMIN 3.3*   No results for input(s): LIPASE, AMYLASE in the last 168 hours. No results for input(s): AMMONIA in the last 168 hours. CBC:  Recent Labs Lab 09/07/15 1150 09/08/15 0500  WBC 7.1 5.0  NEUTROABS 6.3  --   HGB 10.7* 10.2*  HCT 32.2* 31.2*  MCV 95.0 95.4  PLT 201 189   Cardiac Enzymes:  Recent Labs Lab 09/07/15 1536 09/07/15 2141 09/08/15 0500  CKTOTAL 234  --   --   TROPONINI  --  0.06* 0.09*   BNP (last 3 results) No results for input(s): BNP in the last 8760 hours.  ProBNP (last 3 results) No results for input(s): PROBNP in the last 8760 hours.  CBG:  Recent Labs Lab 09/07/15 1125 09/08/15 0722  GLUCAP 122* 98    No results found for this or any previous visit (from the past 240 hour(s)).   Studies: Dg Chest 1 View  09/07/2015  CLINICAL DATA:  Fall. EXAM: CHEST 1 VIEW COMPARISON:  December 19, 2013. FINDINGS: The heart size and mediastinal contours are within normal limits. Minimal right apical pneumothorax is noted. Moderately displaced fracture is seen involving the lateral portion of the right fourth rib. Left lung is clear. No significant pleural effusion is noted. Mildly displaced distal right clavicular fracture is noted, as well as mildly displaced fracture  involving the acromion. Atherosclerosis of thoracic aorta is noted. IMPRESSION: Mildly displaced fractures are seen involving the distal right clavicle and acromion. Moderately displaced fracture involving lateral portion of right fourth rib. Minimal right apical pneumothorax is noted. Critical Value/emergent results were called by telephone at the time of interpretation on 09/07/2015 at 12:26 pm to Dr. Tilden Fossa , who verbally acknowledged these results. Electronically Signed   By: Lupita Raider, M.D.   On: 09/07/2015 12:27   Dg Clavicle Right  09/08/2015  CLINICAL DATA:  Clavicle fracture. EXAM: RIGHT CLAVICLE - 2+ VIEWS COMPARISON:  09/08/2015. FINDINGS: Slightly displaced fracture of the distal clavicle is noted. Fracture of the acromion process is noted.Moderate size right apical pneumothorax noted. Displaced fracture of the posterior lateral aspect of the right fourth rib noted. Prior median sternotomy . IMPRESSION: 1. Slightly displaced fracture of the distal clavicle. 2.  Fracture of the acromion process noted. 3. Displaced fracture of the posterior lateral aspect of the right fourth rib noted. 4.  Moderate size right apical pneumothorax. Critical Value/emergent results were called by telephone at the  time of interpretation on 09/08/2015 at 8:06 am to nurse [facility] who verbally acknowledged these results. Electronically Signed   By: Maisie Fus  Register   On: 09/08/2015 08:10   Ct Head Wo Contrast  09/07/2015  CLINICAL DATA:  Found laying on bathroom floor, laceration along the right forehead. Fall. EXAM: CT HEAD WITHOUT CONTRAST CT CERVICAL SPINE WITHOUT CONTRAST TECHNIQUE: Multidetector CT imaging of the head and cervical spine was performed following the standard protocol without intravenous contrast. Multiplanar CT image reconstructions of the cervical spine were also generated. COMPARISON:  09/06/2015 FINDINGS: CT HEAD FINDINGS The brainstem, cerebellum, cerebral peduncles, thalami, basal  ganglia, basilar cisterns, and ventricular system appear within normal limits. Periventricular white matter and corona radiata hypodensities favor chronic ischemic microvascular white matter disease. No intracranial hemorrhage, mass lesion, or acute CVA. Right frontal scalp laceration. Acute on chronic right maxillary sinusitis with chronic ethmoid and chronic left maxillary sinusitis. There is atherosclerotic calcification of the cavernous carotid arteries bilaterally. CT CERVICAL SPINE FINDINGS Calcified pannus posterior to the odontoid. Similar appearance of 3 mm degenerative retrolisthesis at C4-5. 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 The left C2- 3 and C3-4 facet joints are fused. There is some bridging interbody spurring at C3-4. Degenerative endplate sclerosis at C4-5 and C6-7 both with posterior osseous ridging. No cervical spine fracture or acute subluxation is identified. There is evidence of moderate central narrowing of the thecal sac at C4-5 due to subluxation and spurring. Osseous right foraminal stenosis is severe at C4-5 and mild to moderate at C3-4. Spurring causes left foraminal stenosis at C3-4, C4-5, and C6-7. No prevertebral soft tissue swelling. There is a small right apical pneumothorax. Fracture the right second rib medially, observed on image 20 series 8. Displaced medial clavicular fracture on the right. Based on conventional radiography today the patient is also thought to have lateral clavicular fracture on the right as well as a lateral acromial fracture. IMPRESSION: 1. Small right apical pneumothorax with fracture of the right second rib medially. Right medial clavicular fracture. Based on today's conventional radiographs the patient is also thought to have a right lateral clavicular fracture as well as other right rib fractures including the right third and fourth ribs. 2. Right frontal scalp laceration.  No acute intracranial findings. 3. Cervical  spondylosis and degenerative disc disease with multilevel impingement, but no acute cervical spine fracture. These results were called by telephone at the time of interpretation on 09/07/2015 at 12:12 pm to Dr. Tilden Fossa , who verbally acknowledged these results. Electronically Signed   By: Gaylyn Rong M.D.   On: 09/07/2015 12:15   Ct Chest W Contrast  09/07/2015  CLINICAL DATA:  Larey Seat today.  Rib fractures and pneumothorax. EXAM: CT CHEST, ABDOMEN, AND PELVIS WITH CONTRAST TECHNIQUE: Multidetector CT imaging of the chest, abdomen and pelvis was performed following the standard protocol during bolus administration of intravenous contrast. CONTRAST:  80mL OMNIPAQUE IOHEXOL 300 MG/ML  SOLN COMPARISON:  Chest x-ray 09/07/2015 and CT abdomen/ pelvis 01/10/2014 FINDINGS: CT CHEST FINDINGS Mediastinum/Lymph Nodes: Right-sided chest wall edema/ hematoma likely related to right clavicle fracture. No chest wall mass, supraclavicular or axillary adenopathy. The thyroid gland is grossly normal. The major vascular structures are patent. The heart is normal in size for age. No pericardial effusion. Moderate tortuosity, ectasia and calcification of the thoracic aorta but no focal aneurysm or dissection. Three-vessel coronary artery calcifications are noted. Small amount of probable mediastinal hematoma likely from the displace clavicle fracture near the sternoclavicular  joint. The pulmonary arteries appear normal. The esophagus is grossly normal. Lungs/Pleura: There is a moderate-sized anterior pneumothorax estimated at 15%. There are also small pleural effusions and bibasilar atelectasis. No pulmonary contusion or worrisome pulmonary lesions. Musculoskeletal: Displaced clavicle fracture noted at the sternoclavicular joint. The anterior second and third ribs are fractured. There is also a fourth rib fracture laterally. No left-sided rib fractures are identified. The vertebral bodies are intact. No sternal fracture.  Sternal wires related to prior bypass surgery. CT ABDOMEN PELVIS FINDINGS Hepatobiliary: No focal hepatic hepatic lesions or intrahepatic biliary dilatation. No acute hepatic injury. The gallbladder is mildly distended. No acute inflammation. No common bile duct dilatation. Pancreas: No mass, inflammation or acute injury. Spleen: Intact.  No acute injury. Adrenals/Urinary Tract: The adrenal glands are normal. There are multiple bilateral renal calculi and renal cortical thinning but no acute renal injury. Stomach/Bowel: The stomach, duodenum, small bowel and colon are grossly normal without oral contrast. Vascular/Lymphatic: Moderate atherosclerotic calcifications involving the aorta and branch vessels but no dissection or focal aneurysm. Fairly significant bilateral renal artery calcifications. Small infrarenal abdominal aortic aneurysm just above the iliac artery bifurcation measuring 3.0 x 3.0 cm. Reproductive: The prostate gland is enlarged. There is significant median lobe hypertrophy projecting well up into the bladder. This appears relatively stable since 2015. Other: Small amount of free pelvic fluid is noted of uncertain significance or etiology. Musculoskeletal: There is a fracture involving the greater trochanter of the left hip. No definite neck or intertrochanteric fracture. There are advanced degenerative changes involving both hips. There is diffuse osteo per [facility] and advanced degenerative changes involving the spine. IMPRESSION: 1. Displaced proximal right clavicle fracture near the sternoclavicular joint with moderate surrounding hematoma. 2. Second, third and fourth right rib fractures. 3. Estimated 15% right-sided pneumothorax. No pulmonary contusions. Small effusions and bibasilar atelectasis. 4. No acute solid organ injury is identified in the abdomen. There is free pelvic fluid noted in the right pericolic gutter and pelvis of uncertain etiology or significance. Could not exclude a subtle  bowel injury. No free air is identified. Recommend close clinical follow-up. 5. Numerous bilateral renal calculi. 6. Advanced atherosclerotic calcifications involving the thoracic and abdominal aortas and branch vessels. 7. Significant median lobe hypertrophy of the prostate gland. 8. Comminuted fracture involving the greater trochanter of the left hip. 9. Left inferior pubic ramus fracture. Electronically Signed   By: Rudie Meyer M.D.   On: 09/07/2015 13:15   Ct Cervical Spine Wo Contrast  09/07/2015  CLINICAL DATA:  Found laying on bathroom floor, laceration along the right forehead. Fall. EXAM: CT HEAD WITHOUT CONTRAST CT CERVICAL SPINE WITHOUT CONTRAST TECHNIQUE: Multidetector CT imaging of the head and cervical spine was performed following the standard protocol without intravenous contrast. Multiplanar CT image reconstructions of the cervical spine were also generated. COMPARISON:  09/06/2015 FINDINGS: CT HEAD FINDINGS The brainstem, cerebellum, cerebral peduncles, thalami, basal ganglia, basilar cisterns, and ventricular system appear within normal limits. Periventricular white matter and corona radiata hypodensities favor chronic ischemic microvascular white matter disease. No intracranial hemorrhage, mass lesion, or acute CVA. Right frontal scalp laceration. Acute on chronic right maxillary sinusitis with chronic ethmoid and chronic left maxillary sinusitis. There is atherosclerotic calcification of the cavernous carotid arteries bilaterally. CT CERVICAL SPINE FINDINGS Calcified pannus posterior to the odontoid. Similar appearance of 3 mm degenerative retrolisthesis at C4-5. 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 The left C2- 3 and C3-4 facet joints are fused. There is some bridging  interbody spurring at C3-4. Degenerative endplate sclerosis at C4-5 and C6-7 both with posterior osseous ridging. No cervical spine fracture or acute subluxation is identified. There is evidence  of moderate central narrowing of the thecal sac at C4-5 due to subluxation and spurring. Osseous right foraminal stenosis is severe at C4-5 and mild to moderate at C3-4. Spurring causes left foraminal stenosis at C3-4, C4-5, and C6-7. No prevertebral soft tissue swelling. There is a small right apical pneumothorax. Fracture the right second rib medially, observed on image 20 series 8. Displaced medial clavicular fracture on the right. Based on conventional radiography today the patient is also thought to have lateral clavicular fracture on the right as well as a lateral acromial fracture. IMPRESSION: 1. Small right apical pneumothorax with fracture of the right second rib medially. Right medial clavicular fracture. Based on today's conventional radiographs the patient is also thought to have a right lateral clavicular fracture as well as other right rib fractures including the right third and fourth ribs. 2. Right frontal scalp laceration.  No acute intracranial findings. 3. Cervical spondylosis and degenerative disc disease with multilevel impingement, but no acute cervical spine fracture. These results were called by telephone at the time of interpretation on 09/07/2015 at 12:12 pm to Dr. Tilden Fossa , who verbally acknowledged these results. Electronically Signed   By: Gaylyn Rong M.D.   On: 09/07/2015 12:15   Ct Abdomen Pelvis W Contrast  09/07/2015  CLINICAL DATA:  Larey Seat today.  Rib fractures and pneumothorax. EXAM: CT CHEST, ABDOMEN, AND PELVIS WITH CONTRAST TECHNIQUE: Multidetector CT imaging of the chest, abdomen and pelvis was performed following the standard protocol during bolus administration of intravenous contrast. CONTRAST:  80mL OMNIPAQUE IOHEXOL 300 MG/ML  SOLN COMPARISON:  Chest x-ray 09/07/2015 and CT abdomen/ pelvis 01/10/2014 FINDINGS: CT CHEST FINDINGS Mediastinum/Lymph Nodes: Right-sided chest wall edema/ hematoma likely related to right clavicle fracture. No chest wall mass,  supraclavicular or axillary adenopathy. The thyroid gland is grossly normal. The major vascular structures are patent. The heart is normal in size for age. No pericardial effusion. Moderate tortuosity, ectasia and calcification of the thoracic aorta but no focal aneurysm or dissection. Three-vessel coronary artery calcifications are noted. Small amount of probable mediastinal hematoma likely from the displace clavicle fracture near the sternoclavicular joint. The pulmonary arteries appear normal. The esophagus is grossly normal. Lungs/Pleura: There is a moderate-sized anterior pneumothorax estimated at 15%. There are also small pleural effusions and bibasilar atelectasis. No pulmonary contusion or worrisome pulmonary lesions. Musculoskeletal: Displaced clavicle fracture noted at the sternoclavicular joint. The anterior second and third ribs are fractured. There is also a fourth rib fracture laterally. No left-sided rib fractures are identified. The vertebral bodies are intact. No sternal fracture. Sternal wires related to prior bypass surgery. CT ABDOMEN PELVIS FINDINGS Hepatobiliary: No focal hepatic hepatic lesions or intrahepatic biliary dilatation. No acute hepatic injury. The gallbladder is mildly distended. No acute inflammation. No common bile duct dilatation. Pancreas: No mass, inflammation or acute injury. Spleen: Intact.  No acute injury. Adrenals/Urinary Tract: The adrenal glands are normal. There are multiple bilateral renal calculi and renal cortical thinning but no acute renal injury. Stomach/Bowel: The stomach, duodenum, small bowel and colon are grossly normal without oral contrast. Vascular/Lymphatic: Moderate atherosclerotic calcifications involving the aorta and branch vessels but no dissection or focal aneurysm. Fairly significant bilateral renal artery calcifications. Small infrarenal abdominal aortic aneurysm just above the iliac artery bifurcation measuring 3.0 x 3.0 cm. Reproductive: The  prostate gland is  enlarged. There is significant median lobe hypertrophy projecting well up into the bladder. This appears relatively stable since 2015. Other: Small amount of free pelvic fluid is noted of uncertain significance or etiology. Musculoskeletal: There is a fracture involving the greater trochanter of the left hip. No definite neck or intertrochanteric fracture. There are advanced degenerative changes involving both hips. There is diffuse osteo per [facility] and advanced degenerative changes involving the spine. IMPRESSION: 1. Displaced proximal right clavicle fracture near the sternoclavicular joint with moderate surrounding hematoma. 2. Second, third and fourth right rib fractures. 3. Estimated 15% right-sided pneumothorax. No pulmonary contusions. Small effusions and bibasilar atelectasis. 4. No acute solid organ injury is identified in the abdomen. There is free pelvic fluid noted in the right pericolic gutter and pelvis of uncertain etiology or significance. Could not exclude a subtle bowel injury. No free air is identified. Recommend close clinical follow-up. 5. Numerous bilateral renal calculi. 6. Advanced atherosclerotic calcifications involving the thoracic and abdominal aortas and branch vessels. 7. Significant median lobe hypertrophy of the prostate gland. 8. Comminuted fracture involving the greater trochanter of the left hip. 9. Left inferior pubic ramus fracture. Electronically Signed   By: Rudie Meyer M.D.   On: 09/07/2015 13:15   Dg Chest Port 1 View  09/08/2015  CLINICAL DATA:  Pneumothorax EXAM: PORTABLE CHEST 1 VIEW COMPARISON:  09/07/2015 FINDINGS: Grossly unchanged enlarged cardiac silhouette and mediastinal contours post median sternotomy and CABG. Atherosclerotic plaque with a tortuous thoracic aorta. Worsening right medial basilar heterogeneous opacities. No pleural effusion. Interval increase in size of small right sided pneumothorax. No definitive mediastinal shift. No  evidence of edema. Unchanged bones including displaced right clavicular, acromion and right lateral rib fractures, incompletely evaluated. IMPRESSION: 1. Interval increase in size of small right-sided pneumothorax. No definite evidence of tension physiology. Continued attention on follow-up is recommended. 2. Worsening right medial basilar heterogeneous opacities favored to represent atelectasis versus contusion. These results will be called to the ordering clinician or representative by the Radiologist Assistant, and communication documented in the PACS or zVision Dashboard. Electronically Signed   By: Simonne Come M.D.   On: 09/08/2015 07:31   Dg Hip Unilat With Pelvis 2-3 Views Left  09/07/2015  CLINICAL DATA:  Found down today. Possibly fell some time during the night. EXAM: DG HIP (WITH OR WITHOUT PELVIS) 2-3V LEFT COMPARISON:  08/06/2015 FINDINGS: Severe left hip joint degenerative changes are again demonstrated. No obvious acute fracture. The right hip is normally located. No definite fracture. The pubic symphysis and SI joints are intact. No definite pelvic fractures. IMPRESSION: No acute hip or pelvic fractures are identified. Severe left hip joint degenerative changes. Electronically Signed   By: Rudie Meyer M.D.   On: 09/07/2015 14:05    Scheduled Meds: . antiseptic oral rinse  7 mL Mouth Rinse BID  . diltiazem  180 mg Oral Daily  . ipratropium-albuterol  3 mL Nebulization BID  . sodium chloride  3 mL Intravenous Q12H   Continuous Infusions:      Time spent: 35 minutes    Kaedin Hicklin  [facility] Pager [phone] If 7PM-7AM, please contact night-coverage at [email or web address], password [facility] 09/08/2015, 2:51 PM  LOS: 1 day

## 2015-09-08 NOTE — Progress Notes (Signed)
SLP Cancellation Note  Patient Details Name: Kristopher [name] MRN: [id number] DOB: May 26, 1932   Cancelled treatment:       Reason Eval/Treat Not Completed: Patient not medically ready. Pt still NPO, awaiting MD plan. Also pt lethargic this am, not ready for assessment. Will attempt this pm.    Advika Mclelland, Riley NearingBonnie Caroline 09/08/2015, 8:50 AM

## 2015-09-08 NOTE — Progress Notes (Signed)
SLP Cancellation Note  Patient Details Name: Kristopher [name] MRN: [id number] DOB: [birth year]   Cancelled treatment:       Reason Eval/Treat Not Completed: Patient at procedure or test/unavailable   Kristopher [name], Kristopher [name] 09/08/2015, 2:33 PM

## 2015-09-08 NOTE — Progress Notes (Signed)
[facility] Trauma Service  Progress Note   LOS: 1 day   Subjective: Sleeping soundly, have not been able to assess swallowing.  Therefore is NPO.  Pending SLP eval when more awake.  He's satting 100% on 3L [place], very comfortable.  Daughter at bedside says they don't want a lot of extraneous invasive measures taken.  They'd like him comfortable and if we can avoid CT placement she would like to do so.  Echo being performed.    Objective: Vital signs in last 24 hours: Temp:  [98 F (36.7 C)-98.6 F (37 C)] 98.6 F (37 C) (12/15 0545) Pulse Rate:  [75-92] 75 (12/15 0545) Resp:  [11-21] 16 (12/15 0545) BP: (114-179)/(68-99) 114/68 mmHg (12/15 0545) SpO2:  [97 %-100 %] 100 % (12/15 0848) Weight:  [55.747 kg (122 lb 14.4 oz)-59.013 kg (130 lb 1.6 oz)] 55.747 kg (122 lb 14.4 oz) (12/15 0545)    Lab Results:  CBC  Recent Labs  09/07/15 1150 09/08/15 0500  WBC 7.1 5.0  HGB 10.7* 10.2*  HCT 32.2* 31.2*  PLT 201 189   BMET  Recent Labs  09/07/15 1150 09/08/15 0500  NA 139 141  K 3.5 3.5  CL 104 103  CO2 28 30  GLUCOSE 132* 100*  BUN 8 7  CREATININE 0.74 0.82  CALCIUM 8.9 8.9    Imaging: Dg Chest 1 View  09/07/2015  CLINICAL DATA:  Fall. EXAM: CHEST 1 VIEW COMPARISON:  December 19, 2013. FINDINGS: The heart size and mediastinal contours are within normal limits. Minimal right apical pneumothorax is noted. Moderately displaced fracture is seen involving the lateral portion of the right fourth rib. Left lung is clear. No significant pleural effusion is noted. Mildly displaced distal right clavicular fracture is noted, as well as mildly displaced fracture involving the acromion. Atherosclerosis of thoracic aorta is noted. IMPRESSION: Mildly displaced fractures are seen involving the distal right clavicle and acromion. Moderately displaced fracture involving lateral portion of right fourth rib. Minimal right apical pneumothorax is noted. Critical Value/emergent results  were called by telephone at the time of interpretation on 09/07/2015 at 12:26 pm to Dr. Tilden Fossa , who verbally acknowledged these results. Electronically Signed   By: Lupita Raider, M.D.   On: 09/07/2015 12:27   Dg Clavicle Right  09/08/2015  CLINICAL DATA:  Clavicle fracture. EXAM: RIGHT CLAVICLE - 2+ VIEWS COMPARISON:  09/08/2015. FINDINGS: Slightly displaced fracture of the distal clavicle is noted. Fracture of the acromion process is noted.Moderate size right apical pneumothorax noted. Displaced fracture of the posterior lateral aspect of the right fourth rib noted. Prior median sternotomy . IMPRESSION: 1. Slightly displaced fracture of the distal clavicle. 2.  Fracture of the acromion process noted. 3. Displaced fracture of the posterior lateral aspect of the right fourth rib noted. 4.  Moderate size right apical pneumothorax. Critical Value/emergent results were called by telephone at the time of interpretation on 09/08/2015 at 8:06 am to nurse [facility] who verbally acknowledged these results. Electronically Signed   By: Maisie Fus  Register   On: 09/08/2015 08:10   Ct Head Wo Contrast  09/07/2015  CLINICAL DATA:  Found laying on bathroom floor, laceration along the right forehead. Fall. EXAM: CT HEAD WITHOUT CONTRAST CT CERVICAL SPINE WITHOUT CONTRAST TECHNIQUE: Multidetector CT imaging of the head and cervical spine was performed following the standard protocol without intravenous contrast. Multiplanar CT image reconstructions of the cervical spine were also generated. COMPARISON:  09/06/2015 FINDINGS: CT HEAD FINDINGS The brainstem, cerebellum,  cerebral peduncles, thalami, basal ganglia, basilar cisterns, and ventricular system appear within normal limits. Periventricular white matter and corona radiata hypodensities favor chronic ischemic microvascular white matter disease. No intracranial hemorrhage, mass lesion, or acute CVA. Right frontal scalp laceration. Acute on chronic right maxillary  sinusitis with chronic ethmoid and chronic left maxillary sinusitis. There is atherosclerotic calcification of the cavernous carotid arteries bilaterally. CT CERVICAL SPINE FINDINGS Calcified pannus posterior to the odontoid. Similar appearance of 3 mm degenerative retrolisthesis at C4-5. 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 The left C2- 3 and C3-4 facet joints are fused. There is some bridging interbody spurring at C3-4. Degenerative endplate sclerosis at C4-5 and C6-7 both with posterior osseous ridging. No cervical spine fracture or acute subluxation is identified. There is evidence of moderate central narrowing of the thecal sac at C4-5 due to subluxation and spurring. Osseous right foraminal stenosis is severe at C4-5 and mild to moderate at C3-4. Spurring causes left foraminal stenosis at C3-4, C4-5, and C6-7. No prevertebral soft tissue swelling. There is a small right apical pneumothorax. Fracture the right second rib medially, observed on image 20 series 8. Displaced medial clavicular fracture on the right. Based on conventional radiography today the patient is also thought to have lateral clavicular fracture on the right as well as a lateral acromial fracture. IMPRESSION: 1. Small right apical pneumothorax with fracture of the right second rib medially. Right medial clavicular fracture. Based on today's conventional radiographs the patient is also thought to have a right lateral clavicular fracture as well as other right rib fractures including the right third and fourth ribs. 2. Right frontal scalp laceration.  No acute intracranial findings. 3. Cervical spondylosis and degenerative disc disease with multilevel impingement, but no acute cervical spine fracture. These results were called by telephone at the time of interpretation on 09/07/2015 at 12:12 pm to Dr. Tilden Fossa , who verbally acknowledged these results. Electronically Signed   By: Gaylyn Rong M.D.   On:  09/07/2015 12:15   Ct Chest W Contrast  09/07/2015  CLINICAL DATA:  Larey Seat today.  Rib fractures and pneumothorax. EXAM: CT CHEST, ABDOMEN, AND PELVIS WITH CONTRAST TECHNIQUE: Multidetector CT imaging of the chest, abdomen and pelvis was performed following the standard protocol during bolus administration of intravenous contrast. CONTRAST:  80mL OMNIPAQUE IOHEXOL 300 MG/ML  SOLN COMPARISON:  Chest x-ray 09/07/2015 and CT abdomen/ pelvis 01/10/2014 FINDINGS: CT CHEST FINDINGS Mediastinum/Lymph Nodes: Right-sided chest wall edema/ hematoma likely related to right clavicle fracture. No chest wall mass, supraclavicular or axillary adenopathy. The thyroid gland is grossly normal. The major vascular structures are patent. The heart is normal in size for age. No pericardial effusion. Moderate tortuosity, ectasia and calcification of the thoracic aorta but no focal aneurysm or dissection. Three-vessel coronary artery calcifications are noted. Small amount of probable mediastinal hematoma likely from the displace clavicle fracture near the sternoclavicular joint. The pulmonary arteries appear normal. The esophagus is grossly normal. Lungs/Pleura: There is a moderate-sized anterior pneumothorax estimated at 15%. There are also small pleural effusions and bibasilar atelectasis. No pulmonary contusion or worrisome pulmonary lesions. Musculoskeletal: Displaced clavicle fracture noted at the sternoclavicular joint. The anterior second and third ribs are fractured. There is also a fourth rib fracture laterally. No left-sided rib fractures are identified. The vertebral bodies are intact. No sternal fracture. Sternal wires related to prior bypass surgery. CT ABDOMEN PELVIS FINDINGS Hepatobiliary: No focal hepatic hepatic lesions or intrahepatic biliary dilatation. No acute hepatic injury. The  gallbladder is mildly distended. No acute inflammation. No common bile duct dilatation. Pancreas: No mass, inflammation or acute injury.  Spleen: Intact.  No acute injury. Adrenals/Urinary Tract: The adrenal glands are normal. There are multiple bilateral renal calculi and renal cortical thinning but no acute renal injury. Stomach/Bowel: The stomach, duodenum, small bowel and colon are grossly normal without oral contrast. Vascular/Lymphatic: Moderate atherosclerotic calcifications involving the aorta and branch vessels but no dissection or focal aneurysm. Fairly significant bilateral renal artery calcifications. Small infrarenal abdominal aortic aneurysm just above the iliac artery bifurcation measuring 3.0 x 3.0 cm. Reproductive: The prostate gland is enlarged. There is significant median lobe hypertrophy projecting well up into the bladder. This appears relatively stable since 2015. Other: Small amount of free pelvic fluid is noted of uncertain significance or etiology. Musculoskeletal: There is a fracture involving the greater trochanter of the left hip. No definite neck or intertrochanteric fracture. There are advanced degenerative changes involving both hips. There is diffuse osteo per [place] and advanced degenerative changes involving the spine. IMPRESSION: 1. Displaced proximal right clavicle fracture near the sternoclavicular joint with moderate surrounding hematoma. 2. Second, third and fourth right rib fractures. 3. Estimated 15% right-sided pneumothorax. No pulmonary contusions. Small effusions and bibasilar atelectasis. 4. No acute solid organ injury is identified in the abdomen. There is free pelvic fluid noted in the right pericolic gutter and pelvis of uncertain etiology or significance. Could not exclude a subtle bowel injury. No free air is identified. Recommend close clinical follow-up. 5. Numerous bilateral renal calculi. 6. Advanced atherosclerotic calcifications involving the thoracic and abdominal aortas and branch vessels. 7. Significant median lobe hypertrophy of the prostate gland. 8. Comminuted fracture involving the greater  trochanter of the left hip. 9. Left inferior pubic ramus fracture. Electronically Signed   By: Rudie Meyer M.D.   On: 09/07/2015 13:15   Ct Cervical Spine Wo Contrast  09/07/2015  CLINICAL DATA:  Found laying on bathroom floor, laceration along the right forehead. Fall. EXAM: CT HEAD WITHOUT CONTRAST CT CERVICAL SPINE WITHOUT CONTRAST TECHNIQUE: Multidetector CT imaging of the head and cervical spine was performed following the standard protocol without intravenous contrast. Multiplanar CT image reconstructions of the cervical spine were also generated. COMPARISON:  09/06/2015 FINDINGS: CT HEAD FINDINGS The brainstem, cerebellum, cerebral peduncles, thalami, basal ganglia, basilar cisterns, and ventricular system appear within normal limits. Periventricular white matter and corona radiata hypodensities favor chronic ischemic microvascular white matter disease. No intracranial hemorrhage, mass lesion, or acute CVA. Right frontal scalp laceration. Acute on chronic right maxillary sinusitis with chronic ethmoid and chronic left maxillary sinusitis. There is atherosclerotic calcification of the cavernous carotid arteries bilaterally. CT CERVICAL SPINE FINDINGS Calcified pannus posterior to the odontoid. Similar appearance of 3 mm degenerative retrolisthesis at C4-5. 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 The left C2- 3 and C3-4 facet joints are fused. There is some bridging interbody spurring at C3-4. Degenerative endplate sclerosis at C4-5 and C6-7 both with posterior osseous ridging. No cervical spine fracture or acute subluxation is identified. There is evidence of moderate central narrowing of the thecal sac at C4-5 due to subluxation and spurring. Osseous right foraminal stenosis is severe at C4-5 and mild to moderate at C3-4. Spurring causes left foraminal stenosis at C3-4, C4-5, and C6-7. No prevertebral soft tissue swelling. There is a small right apical pneumothorax. Fracture  the right second rib medially, observed on image 20 series 8. Displaced medial clavicular fracture on the right. Based on conventional  radiography today the patient is also thought to have lateral clavicular fracture on the right as well as a lateral acromial fracture. IMPRESSION: 1. Small right apical pneumothorax with fracture of the right second rib medially. Right medial clavicular fracture. Based on today's conventional radiographs the patient is also thought to have a right lateral clavicular fracture as well as other right rib fractures including the right third and fourth ribs. 2. Right frontal scalp laceration.  No acute intracranial findings. 3. Cervical spondylosis and degenerative disc disease with multilevel impingement, but no acute cervical spine fracture. These results were called by telephone at the time of interpretation on 09/07/2015 at 12:12 pm to Dr. Tilden FossaELIZABETH REES , who verbally acknowledged these results. Electronically Signed   By: Gaylyn RongWalter  Liebkemann M.D.   On: 09/07/2015 12:15   Mr Brain Wo Contrast  09/06/2015  CLINICAL DATA:  Aphasia, memory loss and confusion for 1.5 years increasing in frequency. Stroke risk factors include atrial fibrillation, hypertension, and CABG. Head injury February 2015. EXAM: MRI HEAD WITHOUT CONTRAST TECHNIQUE: Multiplanar, multiecho pulse sequences of the brain and surrounding structures were obtained without intravenous contrast. COMPARISON:  CT head 12/19/2013. FINDINGS: No evidence for acute infarction, hemorrhage, mass lesion, or extra-axial fluid. Advanced cerebral and cerebellar atrophy. Hydrocephalus ex vacuo. Mild to moderate T2 and FLAIR hyperintensities throughout periventricular and subcortical white matter representing chronic microvascular ischemic change. Remote LEFT parietal and LEFT temporal cortical and subcortical infarcts. Dolichoectatic but widely patent cerebral vasculature. Cervical spondylosis with suspected spinal stenosis at C3-4  and C4-5. Unremarkable pituitary and cerebellar tonsils. No osseous findings. Extracranial soft tissues unremarkable. Chronic RIGHT maxillary sinus disease with retention cyst formation. BILATERAL cataract extraction appears uncomplicated. Compared with the prior CT in 2015, progression of atrophy is noted. The chronic areas of cortical infarction were observed on the scan, but there is increasing diffuse cortical volume loss. IMPRESSION: No acute intracranial findings. Advanced atrophy with mild to moderate small vessel disease. Remote LEFT parietal and LEFT temporal infarcts. Cervical spondylosis. Electronically Signed   By: Elsie StainJohn T Curnes M.D.   On: 09/06/2015 13:42   Ct Abdomen Pelvis W Contrast  09/07/2015  CLINICAL DATA:  Larey SeatFell today.  Rib fractures and pneumothorax. EXAM: CT CHEST, ABDOMEN, AND PELVIS WITH CONTRAST TECHNIQUE: Multidetector CT imaging of the chest, abdomen and pelvis was performed following the standard protocol during bolus administration of intravenous contrast. CONTRAST:  80mL OMNIPAQUE IOHEXOL 300 MG/ML  SOLN COMPARISON:  Chest x-ray 09/07/2015 and CT abdomen/ pelvis 01/10/2014 FINDINGS: CT CHEST FINDINGS Mediastinum/Lymph Nodes: Right-sided chest wall edema/ hematoma likely related to right clavicle fracture. No chest wall mass, supraclavicular or axillary adenopathy. The thyroid gland is grossly normal. The major vascular structures are patent. The heart is normal in size for age. No pericardial effusion. Moderate tortuosity, ectasia and calcification of the thoracic aorta but no focal aneurysm or dissection. Three-vessel coronary artery calcifications are noted. Small amount of probable mediastinal hematoma likely from the displace clavicle fracture near the sternoclavicular joint. The pulmonary arteries appear normal. The esophagus is grossly normal. Lungs/Pleura: There is a moderate-sized anterior pneumothorax estimated at 15%. There are also small pleural effusions and bibasilar  atelectasis. No pulmonary contusion or worrisome pulmonary lesions. Musculoskeletal: Displaced clavicle fracture noted at the sternoclavicular joint. The anterior second and third ribs are fractured. There is also a fourth rib fracture laterally. No left-sided rib fractures are identified. The vertebral bodies are intact. No sternal fracture. Sternal wires related to prior bypass surgery. CT ABDOMEN PELVIS FINDINGS Hepatobiliary:  No focal hepatic hepatic lesions or intrahepatic biliary dilatation. No acute hepatic injury. The gallbladder is mildly distended. No acute inflammation. No common bile duct dilatation. Pancreas: No mass, inflammation or acute injury. Spleen: Intact.  No acute injury. Adrenals/Urinary Tract: The adrenal glands are normal. There are multiple bilateral renal calculi and renal cortical thinning but no acute renal injury. Stomach/Bowel: The stomach, duodenum, small bowel and colon are grossly normal without oral contrast. Vascular/Lymphatic: Moderate atherosclerotic calcifications involving the aorta and branch vessels but no dissection or focal aneurysm. Fairly significant bilateral renal artery calcifications. Small infrarenal abdominal aortic aneurysm just above the iliac artery bifurcation measuring 3.0 x 3.0 cm. Reproductive: The prostate gland is enlarged. There is significant median lobe hypertrophy projecting well up into the bladder. This appears relatively stable since 2015. Other: Small amount of free pelvic fluid is noted of uncertain significance or etiology. Musculoskeletal: There is a fracture involving the greater trochanter of the left hip. No definite neck or intertrochanteric fracture. There are advanced degenerative changes involving both hips. There is diffuse osteo per [place] and advanced degenerative changes involving the spine. IMPRESSION: 1. Displaced proximal right clavicle fracture near the sternoclavicular joint with moderate surrounding hematoma. 2. Second, third and  fourth right rib fractures. 3. Estimated 15% right-sided pneumothorax. No pulmonary contusions. Small effusions and bibasilar atelectasis. 4. No acute solid organ injury is identified in the abdomen. There is free pelvic fluid noted in the right pericolic gutter and pelvis of uncertain etiology or significance. Could not exclude a subtle bowel injury. No free air is identified. Recommend close clinical follow-up. 5. Numerous bilateral renal calculi. 6. Advanced atherosclerotic calcifications involving the thoracic and abdominal aortas and branch vessels. 7. Significant median lobe hypertrophy of the prostate gland. 8. Comminuted fracture involving the greater trochanter of the left hip. 9. Left inferior pubic ramus fracture. Electronically Signed   By: Rudie Meyer M.D.   On: 09/07/2015 13:15   Dg Chest Port 1 View  09/08/2015  CLINICAL DATA:  Pneumothorax EXAM: PORTABLE CHEST 1 VIEW COMPARISON:  09/07/2015 FINDINGS: Grossly unchanged enlarged cardiac silhouette and mediastinal contours post median sternotomy and CABG. Atherosclerotic plaque with a tortuous thoracic aorta. Worsening right medial basilar heterogeneous opacities. No pleural effusion. Interval increase in size of small right sided pneumothorax. No definitive mediastinal shift. No evidence of edema. Unchanged bones including displaced right clavicular, acromion and right lateral rib fractures, incompletely evaluated. IMPRESSION: 1. Interval increase in size of small right-sided pneumothorax. No definite evidence of tension physiology. Continued attention on follow-up is recommended. 2. Worsening right medial basilar heterogeneous opacities favored to represent atelectasis versus contusion. These results will be called to the ordering clinician or representative by the Radiologist Assistant, and communication documented in the PACS or zVision Dashboard. Electronically Signed   By: Simonne Come M.D.   On: 09/08/2015 07:31   Dg Hip Unilat With Pelvis  2-3 Views Left  09/07/2015  CLINICAL DATA:  Found down today. Possibly fell some time during the night. EXAM: DG HIP (WITH OR WITHOUT PELVIS) 2-3V LEFT COMPARISON:  08/06/2015 FINDINGS: Severe left hip joint degenerative changes are again demonstrated. No obvious acute fracture. The right hip is normally located. No definite fracture. The pubic symphysis and SI joints are intact. No definite pelvic fractures. IMPRESSION: No acute hip or pelvic fractures are identified. Severe left hip joint degenerative changes. Electronically Signed   By: Rudie Meyer M.D.   On: 09/07/2015 14:05     PE: General: sleeping, sedated, WD/WN white  male who is laying in bed in NAD HEENT: head is normocephalic, laceration repaired to right frontal.  Sclera are noninjected.  Ears and nose without any masses or lesions.  Mouth is pink and moist Heart: regular, rate, and rhythm.  SE murmur heard. Lungs: 3L [place] satting 100%, diminished BS at right base and apex, but poor effort since he is sleeping, no wheezes, rhonchi, or rales noted.  Deformity to right clavicle, extensive ecchymosis to right neck, shoulder, chest. Abd: soft, NT/ND, +BS, no masses, hernias, or organomegaly MS: Extremities are warm and dry some abrasions, no significant swelling or deformity Skin: warm and dry with no masses, lesions, or rashes Psych: sleeping, not able to assess  Assessment/Plan: Unwitnessed fall - medical workup. Admit to Medicine for syncopal workup and due to multiple major comorbidities. Displaced right clavicle fx - ortho consult, but will likely be non-op with sling Right pneumothorax - 15% - some bigger, but family would like to hold off on CT while he continues to be stable on O2 [place], repeat xray in am Ribs 2-4 fx - pain control, pulm toilet, duo nebs Small amount of free fluid without significant intraabdominal injury - serial exams and monitor Hgb, no surgery planned Left inferior ramus fx - ortho consult Left comminuted  greater trochanter fx - ortho consult TBI/Right frontal laceration - repair by EDP, local care, will need TBI teams tomorrow MMP - AFIB without anticoagulation, CAD s/p CABG, recent GI bleed, h/o TIA, DJD/OA, recent cognitive decline FEN - SLP to eval swallowing when more awake, allow clears if able to sit up in bed, pain control DVT Proph - per primary, no longer on anticoagulation for AFIB due to GI bleeding Disp - Inpatient workup, We are concerned this may be a life altering event.  Family has made him a DNR and do not want a lot of interventions if they can be avoided   Jorje Guild, PA-C Pager: [phone] General Trauma PA Pager: [phone]   09/08/2015

## 2015-09-08 NOTE — Progress Notes (Signed)
UR Completed Hager Compston Graves-Bigelow, RN,BSN [phone]  

## 2015-09-08 NOTE — Progress Notes (Signed)
  Echocardiogram 2D Echocardiogram has been performed.  Arvil ChacoFoster, Avish Torry 09/08/2015, 11:09 AM

## 2015-09-08 NOTE — Plan of Care (Signed)
Problem: Safety: Goal: Ability to remain free from injury will improve Outcome: Progressing Spoke with patients daughter about bed alarm being on and safety checks for patient.

## 2015-09-08 NOTE — Plan of Care (Signed)
Problem: Nutrition: Goal: Adequate nutrition will be maintained Outcome: Progressing Spoke/ educated   patients daughter about having the patient on clear liquids unti speech can do a swallow study.

## 2015-09-08 NOTE — Procedures (Signed)
History: 81 yo M with syncope  Sedation: None  Technique: This is a 21 channel routine scalp EEG performed at the bedside with bipolar and monopolar montages arranged in accordance to the international 10/20 system of electrode placement. One channel was dedicated to EKG recording.    Background: The background consists of predominantly theta range irregular slow activity with some irregular generalized delta as well.  There is a posterior dominant rhythm that is poorly formed and poorly sustained of 7 Hz that is seen at times. .  Photic stimulation: Physiologic driving is not performed.   EEG Abnormalities: 1) Generalized irregular slow activity 2) slow PDR  Clinical Interpretation: This EEG is consistent with a generalized non-specific cerebral dysfunction(encephalopathy). There was no seizure or seizure predisposition recorded on this study.   Kristopher [name] Kristopher Defelice, MD [facility] [phone]  If 7pm- 7am, please page neurology on call as listed in AMION.

## 2015-09-08 NOTE — Progress Notes (Signed)
Pt resting in bed.Troponin trending from .06 to .09. Np on call paged. Will cont to monitor pt.

## 2015-09-09 ENCOUNTER — Inpatient Hospital Stay (HOSPITAL_COMMUNITY): Payer: Medicare Other

## 2015-09-09 DIAGNOSIS — S32501D Unspecified fracture of right pubis, subsequent encounter for fracture with routine healing: Secondary | ICD-10-CM

## 2015-09-09 LAB — GLUCOSE, CAPILLARY: Glucose-Capillary: 104 mg/dL — ABNORMAL HIGH (ref 65–99)

## 2015-09-09 NOTE — [place] FL2 (Signed)
[place] MEDICAID FL2 LEVEL OF CARE SCREENING TOOL     IDENTIFICATION  Patient Name: Kristopher [name] Birthdate: [birth year] Sex: male Admission Date (Current Location): 09/07/2015  [facility] [place]   Facility and Address:  [facility], [address], [place], [place] [address]      Provider Number: [id number]  Attending Physician Name and Address:  Eddie NorthNishant Dhungel, MD  Relative Name and Phone Number:  [name]    Current Level of Care: Hospital Recommended Level of Care: Skilled Nursing Facility Prior Approval Number:    Date Approved/Denied:   PASRR Number:    Discharge Plan: SNF    Current Diagnoses: Patient Active Problem List   Diagnosis Date Noted  . Fracture of pubis (HCC)   . Anterior displaced fracture of proximal clavicle with delayed healing   . Pneumothorax 09/07/2015  . Rib fracture 09/07/2015  . Hip fracture (HCC) 09/07/2015  . Pelvic fracture (HCC) 09/07/2015  . Anemia 09/07/2015  . Syncope and collapse 09/07/2015  . Fall 09/07/2015  . Hypertension   . Clavicle fracture   . Essential hypertension   . CN (constipation) 03/25/2015  . Personal history of arterial venous malformation (AVM) 03/25/2015  . Osteoarthritis 12/13/2014  . Paroxysmal atrial fibrillation (HCC) 12/13/2014  . Systolic murmur 12/13/2014  . Atherosclerosis of native coronary artery of native heart without angina pectoris 05/11/2014  . History of coronary artery bypass graft 05/11/2014  . History of GI bleed 05/11/2014  . Left leg pain 05/11/2014    Orientation RESPIRATION BLADDER Height & Weight    Self  Normal Incontinent, Indwelling catheter 5\' 8"  (172.7 cm) 127 lbs.  BEHAVIORAL SYMPTOMS/MOOD NEUROLOGICAL BOWEL NUTRITION STATUS   (NONE)  (NONE) Continent Diet (Heart Healthy)  AMBULATORY STATUS COMMUNICATION OF NEEDS Skin   Extensive Assist Verbally Skin abrasions, Surgical wounds (Incision head right, right shoulder bruising,  left elbow skin tear)                       Personal Care Assistance Level of Assistance  Bathing, Dressing Bathing Assistance: Limited assistance   Dressing Assistance: Limited assistance     Functional Limitations Info   (NONE)          SPECIAL CARE FACTORS FREQUENCY  PT (By licensed PT), OT (By licensed OT), Speech therapy     PT Frequency: 5x/week OT Frequency: 5x/week     Speech Therapy Frequency: 5x/week      Contractures Contractures Info: Not present    Additional Factors Info  Allergies, Code Status Code Status Info: DNR Allergies Info: Codeine, other (sulfa antibiotics)           Current Medications (09/09/2015):  This is the current hospital active medication list Current Facility-Administered Medications  Medication Dose Route Frequency Provider Last Rate Last Dose  . acetaminophen (TYLENOL) tablet 650 mg  650 mg Oral Q6H PRN Gwenyth BenderKaren M Black, NP       Or  . acetaminophen (TYLENOL) suppository 650 mg  650 mg Rectal Q6H PRN Gwenyth BenderKaren M Black, NP      . antiseptic oral rinse (CPC / CETYLPYRIDINIUM CHLORIDE 0.05%) solution 7 mL  7 mL Mouth Rinse BID Belkys A Regalado, MD   7 mL at 09/09/15 1000  . diltiazem (CARDIZEM CD) 24 hr capsule 180 mg  180 mg Oral Daily Belkys A Regalado, MD   180 mg at 09/09/15 1020  . fentaNYL (SUBLIMAZE) injection 12.5-25 mcg  12.5-25 mcg Intravenous Q2H PRN  Eddie North, MD   25 mcg at 09/08/15 1835  . ipratropium-albuterol (DUONEB) 0.5-2.5 (3) MG/3ML nebulizer solution 3 mL  3 mL Nebulization BID Sheral Apley, MD   3 mL at 09/09/15 0816  . levalbuterol (XOPENEX) nebulizer solution 0.63 mg  0.63 mg Nebulization Q4H PRN Belkys A Regalado, MD      . ondansetron (ZOFRAN) tablet 4 mg  4 mg Oral Q6H PRN Gwenyth Bender, NP       Or  . ondansetron [facility]) injection 4 mg  4 mg Intravenous Q6H PRN Lesle Chris Black, NP      . sodium chloride 0.9 % injection 3 mL  3 mL Intravenous Q12H Gwenyth Bender, NP   3 mL at 09/09/15 1021      Discharge Medications: Please see discharge summary for a list of discharge medications.  Relevant Imaging Results:  Relevant Lab Results:   Additional Information    Venita Lick, LCSW

## 2015-09-09 NOTE — Clinical Social Work Note (Signed)
Clinical Social Work Assessment  Patient Details  Name: Kristopher [name] MRN: [id number] Date of Birth: 09-19-1932  Date of referral:  09/09/15               Reason for consult:  Discharge Planning                Permission sought to share information with:  Facility Medical sales representativeContact Representative, Family Supports Permission granted to share information::  Yes, Verbal Permission Granted  Name::     Database administratorherry  Agency::     Relationship::     Contact Information:     Housing/Transportation Living arrangements for the past 2 months:  Market researcherndependent Living Facility Source of Information:  Adult Children Patient Interpreter Needed:  None Criminal Activity/Legal Involvement Pertinent to Current Situation/Hospitalization:  No - Comment as needed Significant Relationships:  Adult Children Lives with:  Self Do you feel safe going back to the place where you live?  No Need for family participation in patient care:  No (Coment)  Care giving concerns:  Patient's family states that they do not feel the patient can return home at discharge.    Social Worker assessment / plan:  CSW spoke with patient's daughter to complete assessment. Patient's daughter states [facility] Facility states they plan to take him as a SNF patient at discharge. Daughter requests that the treatment team be mindful of what they communicate to the patient in regards to his discharge plan. She specifically asks that no one tell the patient that SNF placement will likely be a permanent transition for him as this has upset him in the past. The family plans to talk with him about this together. CSW has made [facility] AssociatesRNCM and charge RN aware of this request. The patient is very confused at this time so CSW will defer to daughter for decisions. CSW will follow.  Employment status:  Disabled (Comment on whether or not currently receiving Disability), Retired Health and safety inspectornsurance information:  Medicare PT Recommendations:  Not assessed  at this time Information / Referral to community resources:  Skilled Nursing Facility  Patient/Family's Response to care:  Family appears to be happy with the care the patient has received.  Patient/Family's Understanding of and Emotional Response to Diagnosis, Current Treatment, and Prognosis:  Patient's family appears to have a good understanding of reason for admission and the patient's post DC needs.   Emotional Assessment Appearance:  Appears stated age Attitude/Demeanor/Rapport:  Unable to Assess Affect (typically observed):  Unable to Assess (Patient very confused.) Orientation:  Oriented to Self Alcohol / Substance use:  Never Used Psych involvement (Current and /or in the community):  No (Comment)  Discharge Needs  Concerns to be addressed:  Discharge Planning Concerns Readmission within the last 30 days:  No Current discharge risk:  Physical Impairment, Chronically ill, Cognitively Impaired Barriers to Discharge:  Continued Medical Work up   The KrogerBryant Aneita Kiger MSW, [place]CS[place], [place], [phone]

## 2015-09-09 NOTE — Progress Notes (Signed)
Initial Nutrition Assessment  DOCUMENTATION CODES:   Severe malnutrition in context of chronic illness  INTERVENTION:    Ensure Enlive PO TID, each supplement provides 350 kcal and 20 grams of protein  Recommend liberalize diet to regular  NUTRITION DIAGNOSIS:   Malnutrition related to chronic illness as evidenced by severe depletion of body fat, severe depletion of muscle mass, percent weight loss (15% weight loss within 3 months).  GOAL:   Patient will meet greater than or equal to 90% of their needs  MONITOR:   PO intake, Supplement acceptance, Labs, Weight trends  REASON FOR ASSESSMENT:   Consult Assessment of nutrition requirement/status  ASSESSMENT:   81 year old male with history of A. fib (off anticoagulation due to multiple GI bleeds), CAD with history of CABG, AVMs, diabetes mellitus, hypertension, BPH, prior history of stroke with progressive decline in memory for the past few months had unwitnessed syncope at home and was found crawling out from the bathroom by the caregiver. Patient brought to the ED and found to have displaced fracture of his right anterior clavicle with 3 right rib fractures, pneumothorax and right greater trochanter fracture with laceration to the scalp.  Patient confused during RD visit. Unable to obtain any nutrition history. Nutrition-Focused physical exam completed. Findings are severe fat depletion, severe muscle depletion, and no edema. Patient with severe PCM.  Diet Order:  Diet Heart Room service appropriate?: Yes; Fluid consistency:: Thin  Skin:  Wound (see comment) (skin tear x 2; closed head wound)  Last BM:  unknown  Height:   Ht Readings from Last 1 Encounters:  09/09/15 5\' 8"  (1.727 m)    Weight:   Wt Readings from Last 1 Encounters:  09/09/15 127 lb 6.4 oz (57.788 kg)    Ideal Body Weight:  70 kg  BMI:  Body mass index is 19.38 kg/(m^2).  Estimated Nutritional Needs:   Kcal:  1800-2000  Protein:  85-100  gm  Fluid:  1.8 L  EDUCATION NEEDS:   No education needs identified at this time  Joaquin CourtsKimberly Harris, RD, LDN, CNSC Pager [phone] After Hours Pager [phone]

## 2015-09-09 NOTE — Clinical Social Work Placement (Signed)
   CLINICAL SOCIAL WORK PLACEMENT  NOTE  Date:  09/09/2015  Patient Details  Name: Kristopher [name] MRN: [id number] Date of Birth: [birth year]  Clinical Social Work is seeking post-discharge placement for this patient at the Skilled  Nursing Facility level of care (*CSW will initial, date and re-position this form in  chart as items are completed):  Yes   Patient/family provided with [facility] Clinical Social Work Department's list of facilities offering this level of care within the geographic area requested by the patient (or if unable, by the patient's family).  Yes   Patient/family informed of their freedom to choose among providers that offer the needed level of care, that participate in Medicare, Medicaid or managed care program needed by the patient, have an available bed and are willing to accept the patient.  Yes   Patient/family informed of [facility]'s ownership interest in [facility] and [facility], as well as of the fact that they are under no obligation to receive care at these facilities.  PASRR submitted to EDS on       PASRR number received on       Existing PASRR number confirmed on       FL2 transmitted to all facilities in geographic area requested by pt/family on 09/09/15     FL2 transmitted to all facilities within larger geographic area on       Patient informed that his/her managed care company has contracts with or will negotiate with certain facilities, including the following:            Patient/family informed of bed offers received.  Patient chooses bed at       Physician recommends and patient chooses bed at      Patient to be transferred to   on  .  Patient to be transferred to facility by       Patient family notified on   of transfer.  Name of family member notified:        PHYSICIAN Please prepare priority discharge summary, including medications, Please prepare prescriptions, Please sign FL2, Please sign DNR     Additional  Comment:    _______________________________________________ Roddie McBryant Zaim Nitta MSW, LCSW, [place], [phone]

## 2015-09-09 NOTE — Progress Notes (Signed)
Subjective: sleeping  Objective: Vital signs in last 24 hours: Temp:  [97.6 F (36.4 C)-98.6 F (37 C)] 97.6 F (36.4 C) (12/16 0500) Pulse Rate:  [79-99] 99 (12/15 2000) Resp:  [14] 14 (12/15 2049) BP: (117-151)/(64-75) 117/64 mmHg (12/16 1020) SpO2:  [100 %] 100 % (12/16 0816) Weight:  [127 lb 6.4 oz (57.788 kg)] 127 lb 6.4 oz (57.788 kg) (12/16 0500) Weight change: -2 lb 11.2 oz (-1.225 kg)   Intake/Output from previous day: -650 12/15 0701 - 12/16 0700 In: 200 [P.O.:200] Out: 850 [Urine:850] Intake/Output this shift: Total I/O In: 3 [I.V.:3] Out: -   PE: General:Pleasant affect, NAD- when wakened Skin:Warm and dry, brisk capillary refill HEENT:normocephalic, sclera clear, mucus membranes moist Heart:S1S2 RRR without murmur, gallup, rub or click Lungs:clear, ant- without rales, rhonchi, or wheezes ZOX:WRUEAbd:soft, non tender, + BS, do not palpate liver spleen or masses Ext:no lower ext edema, 2+ pedal pulses, 2+ radial pulses Neuro:wakes easily, MAE,  + facial symmetry Tele:  SR    Lab Results:  Recent Labs  09/07/15 1150 09/08/15 0500  WBC 7.1 5.0  HGB 10.7* 10.2*  HCT 32.2* 31.2*  PLT 201 189   BMET  Recent Labs  09/07/15 1150 09/08/15 0500  NA 139 141  K 3.5 3.5  CL 104 103  CO2 28 30  GLUCOSE 132* 100*  BUN 8 7  CREATININE 0.74 0.82  CALCIUM 8.9 8.9    Recent Labs  09/07/15 2141 09/08/15 0500  TROPONINI 0.06* 0.09*    No results found for: CHOL, HDL, LDLCALC, LDLDIRECT, TRIG, CHOLHDL No results found for: [id number]   Lab Results  Component Value Date   TSH 0.785 09/07/2015    Hepatic Function Panel  Recent Labs  09/07/15 1150  PROT 5.8*  ALBUMIN 3.3*  AST 28  ALT 21  ALKPHOS 130*  BILITOT 1.2   No results for input(s): CHOL in the last 72 hours. No results for input(s): PROTIME in the last 72 hours.     Studies/Results: ECHO: Procedure narrative: Transthoracic echocardiography. Image quality was  suboptimal. The study was technically difficult. - Left ventricle: The cavity size was normal. Wall thickness was increased in a pattern of moderate LVH. Systolic function was normal. Wall motion was normal; there were no regional wall motion abnormalities. - Aortic valve: Trileaflet; mildly thickened, mildly calcified leaflets. Cusp separation was mildly reduced. Transvalvular velocity was increased. There was mild stenosis. There was mild regurgitation. Valve area (VTI): 1.54 cm^2. Valve area (Vmax): 1.41 cm^2. Valve area (Vmean): 1.42 cm^2. - Left atrium: The atrium was moderately dilated. - Right ventricle: The cavity size was mildly dilated. Wall thickness was normal.    Dg Chest 1 View  09/07/2015  CLINICAL DATA:  Fall. EXAM: CHEST 1 VIEW COMPARISON:  December 19, 2013. FINDINGS: The heart size and mediastinal contours are within normal limits. Minimal right apical pneumothorax is noted. Moderately displaced fracture is seen involving the lateral portion of the right fourth rib. Left lung is clear. No significant pleural effusion is noted. Mildly displaced distal right clavicular fracture is noted, as well as mildly displaced fracture involving the acromion. Atherosclerosis of thoracic aorta is noted. IMPRESSION: Mildly displaced fractures are seen involving the distal right clavicle and acromion. Moderately displaced fracture involving lateral portion of right fourth rib. Minimal right apical pneumothorax is noted. Critical Value/emergent results were called by telephone at the time of interpretation on 09/07/2015 at 12:26 pm to Dr. Tilden FossaELIZABETH REES ,  who verbally acknowledged these results. Electronically Signed   By: Lupita Raider, M.D.   On: 09/07/2015 12:27   Dg Clavicle Right  09/08/2015  CLINICAL DATA:  Clavicle fracture. EXAM: RIGHT CLAVICLE - 2+ VIEWS COMPARISON:  09/08/2015. FINDINGS: Slightly displaced fracture of the distal clavicle is noted. Fracture of the  acromion process is noted.Moderate size right apical pneumothorax noted. Displaced fracture of the posterior lateral aspect of the right fourth rib noted. Prior median sternotomy . IMPRESSION: 1. Slightly displaced fracture of the distal clavicle. 2.  Fracture of the acromion process noted. 3. Displaced fracture of the posterior lateral aspect of the right fourth rib noted. 4.  Moderate size right apical pneumothorax. Critical Value/emergent results were called by telephone at the time of interpretation on 09/08/2015 at 8:06 am to nurse [facility] who verbally acknowledged these results. Electronically Signed   By: Maisie Fus  Register   On: 09/08/2015 08:10   Ct Head Wo Contrast  09/07/2015  CLINICAL DATA:  Found laying on bathroom floor, laceration along the right forehead. Fall. EXAM: CT HEAD WITHOUT CONTRAST CT CERVICAL SPINE WITHOUT CONTRAST TECHNIQUE: Multidetector CT imaging of the head and cervical spine was performed following the standard protocol without intravenous contrast. Multiplanar CT image reconstructions of the cervical spine were also generated. COMPARISON:  09/06/2015 FINDINGS: CT HEAD FINDINGS The brainstem, cerebellum, cerebral peduncles, thalami, basal ganglia, basilar cisterns, and ventricular system appear within normal limits. Periventricular white matter and corona radiata hypodensities favor chronic ischemic microvascular white matter disease. No intracranial hemorrhage, mass lesion, or acute CVA. Right frontal scalp laceration. Acute on chronic right maxillary sinusitis with chronic ethmoid and chronic left maxillary sinusitis. There is atherosclerotic calcification of the cavernous carotid arteries bilaterally. CT CERVICAL SPINE FINDINGS Calcified pannus posterior to the odontoid. Similar appearance of 3 mm degenerative retrolisthesis at C4-5. 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 The left C2- 3 and C3-4 facet joints are fused. There is some bridging interbody  spurring at C3-4. Degenerative endplate sclerosis at C4-5 and C6-7 both with posterior osseous ridging. No cervical spine fracture or acute subluxation is identified. There is evidence of moderate central narrowing of the thecal sac at C4-5 due to subluxation and spurring. Osseous right foraminal stenosis is severe at C4-5 and mild to moderate at C3-4. Spurring causes left foraminal stenosis at C3-4, C4-5, and C6-7. No prevertebral soft tissue swelling. There is a small right apical pneumothorax. Fracture the right second rib medially, observed on image 20 series 8. Displaced medial clavicular fracture on the right. Based on conventional radiography today the patient is also thought to have lateral clavicular fracture on the right as well as a lateral acromial fracture. IMPRESSION: 1. Small right apical pneumothorax with fracture of the right second rib medially. Right medial clavicular fracture. Based on today's conventional radiographs the patient is also thought to have a right lateral clavicular fracture as well as other right rib fractures including the right third and fourth ribs. 2. Right frontal scalp laceration.  No acute intracranial findings. 3. Cervical spondylosis and degenerative disc disease with multilevel impingement, but no acute cervical spine fracture. These results were called by telephone at the time of interpretation on 09/07/2015 at 12:12 pm to Dr. Tilden Fossa , who verbally acknowledged these results. Electronically Signed   By: Gaylyn Rong M.D.   On: 09/07/2015 12:15   Ct Chest W Contrast  09/07/2015  CLINICAL DATA:  Larey Seat today.  Rib fractures and pneumothorax. EXAM: CT CHEST, ABDOMEN, AND  PELVIS WITH CONTRAST TECHNIQUE: Multidetector CT imaging of the chest, abdomen and pelvis was performed following the standard protocol during bolus administration of intravenous contrast. CONTRAST:  80mL OMNIPAQUE IOHEXOL 300 MG/ML  SOLN COMPARISON:  Chest x-ray 09/07/2015 and CT abdomen/  pelvis 01/10/2014 FINDINGS: CT CHEST FINDINGS Mediastinum/Lymph Nodes: Right-sided chest wall edema/ hematoma likely related to right clavicle fracture. No chest wall mass, supraclavicular or axillary adenopathy. The thyroid gland is grossly normal. The major vascular structures are patent. The heart is normal in size for age. No pericardial effusion. Moderate tortuosity, ectasia and calcification of the thoracic aorta but no focal aneurysm or dissection. Three-vessel coronary artery calcifications are noted. Small amount of probable mediastinal hematoma likely from the displace clavicle fracture near the sternoclavicular joint. The pulmonary arteries appear normal. The esophagus is grossly normal. Lungs/Pleura: There is a moderate-sized anterior pneumothorax estimated at 15%. There are also small pleural effusions and bibasilar atelectasis. No pulmonary contusion or worrisome pulmonary lesions. Musculoskeletal: Displaced clavicle fracture noted at the sternoclavicular joint. The anterior second and third ribs are fractured. There is also a fourth rib fracture laterally. No left-sided rib fractures are identified. The vertebral bodies are intact. No sternal fracture. Sternal wires related to prior bypass surgery. CT ABDOMEN PELVIS FINDINGS Hepatobiliary: No focal hepatic hepatic lesions or intrahepatic biliary dilatation. No acute hepatic injury. The gallbladder is mildly distended. No acute inflammation. No common bile duct dilatation. Pancreas: No mass, inflammation or acute injury. Spleen: Intact.  No acute injury. Adrenals/Urinary Tract: The adrenal glands are normal. There are multiple bilateral renal calculi and renal cortical thinning but no acute renal injury. Stomach/Bowel: The stomach, duodenum, small bowel and colon are grossly normal without oral contrast. Vascular/Lymphatic: Moderate atherosclerotic calcifications involving the aorta and branch vessels but no dissection or focal aneurysm. Fairly  significant bilateral renal artery calcifications. Small infrarenal abdominal aortic aneurysm just above the iliac artery bifurcation measuring 3.0 x 3.0 cm. Reproductive: The prostate gland is enlarged. There is significant median lobe hypertrophy projecting well up into the bladder. This appears relatively stable since 2015. Other: Small amount of free pelvic fluid is noted of uncertain significance or etiology. Musculoskeletal: There is a fracture involving the greater trochanter of the left hip. No definite neck or intertrochanteric fracture. There are advanced degenerative changes involving both hips. There is diffuse osteo per [facility] and advanced degenerative changes involving the spine. IMPRESSION: 1. Displaced proximal right clavicle fracture near the sternoclavicular joint with moderate surrounding hematoma. 2. Second, third and fourth right rib fractures. 3. Estimated 15% right-sided pneumothorax. No pulmonary contusions. Small effusions and bibasilar atelectasis. 4. No acute solid organ injury is identified in the abdomen. There is free pelvic fluid noted in the right pericolic gutter and pelvis of uncertain etiology or significance. Could not exclude a subtle bowel injury. No free air is identified. Recommend close clinical follow-up. 5. Numerous bilateral renal calculi. 6. Advanced atherosclerotic calcifications involving the thoracic and abdominal aortas and branch vessels. 7. Significant median lobe hypertrophy of the prostate gland. 8. Comminuted fracture involving the greater trochanter of the left hip. 9. Left inferior pubic ramus fracture. Electronically Signed   By: Rudie Meyer M.D.   On: 09/07/2015 13:15   Ct Cervical Spine Wo Contrast  09/07/2015  CLINICAL DATA:  Found laying on bathroom floor, laceration along the right forehead. Fall. EXAM: CT HEAD WITHOUT CONTRAST CT CERVICAL SPINE WITHOUT CONTRAST TECHNIQUE: Multidetector CT imaging of the head and cervical spine was performed  following the standard protocol without  intravenous contrast. Multiplanar CT image reconstructions of the cervical spine were also generated. COMPARISON:  09/06/2015 FINDINGS: CT HEAD FINDINGS The brainstem, cerebellum, cerebral peduncles, thalami, basal ganglia, basilar cisterns, and ventricular system appear within normal limits. Periventricular white matter and corona radiata hypodensities favor chronic ischemic microvascular white matter disease. No intracranial hemorrhage, mass lesion, or acute CVA. Right frontal scalp laceration. Acute on chronic right maxillary sinusitis with chronic ethmoid and chronic left maxillary sinusitis. There is atherosclerotic calcification of the cavernous carotid arteries bilaterally. CT CERVICAL SPINE FINDINGS Calcified pannus posterior to the odontoid. Similar appearance of 3 mm degenerative retrolisthesis at C4-5. 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 The left C2- 3 and C3-4 facet joints are fused. There is some bridging interbody spurring at C3-4. Degenerative endplate sclerosis at C4-5 and C6-7 both with posterior osseous ridging. No cervical spine fracture or acute subluxation is identified. There is evidence of moderate central narrowing of the thecal sac at C4-5 due to subluxation and spurring. Osseous right foraminal stenosis is severe at C4-5 and mild to moderate at C3-4. Spurring causes left foraminal stenosis at C3-4, C4-5, and C6-7. No prevertebral soft tissue swelling. There is a small right apical pneumothorax. Fracture the right second rib medially, observed on image 20 series 8. Displaced medial clavicular fracture on the right. Based on conventional radiography today the patient is also thought to have lateral clavicular fracture on the right as well as a lateral acromial fracture. IMPRESSION: 1. Small right apical pneumothorax with fracture of the right second rib medially. Right medial clavicular fracture. Based on today's  conventional radiographs the patient is also thought to have a right lateral clavicular fracture as well as other right rib fractures including the right third and fourth ribs. 2. Right frontal scalp laceration.  No acute intracranial findings. 3. Cervical spondylosis and degenerative disc disease with multilevel impingement, but no acute cervical spine fracture. These results were called by telephone at the time of interpretation on 09/07/2015 at 12:12 pm to Dr. Tilden Fossa , who verbally acknowledged these results. Electronically Signed   By: Gaylyn Rong M.D.   On: 09/07/2015 12:15   Ct Abdomen Pelvis W Contrast  09/07/2015  CLINICAL DATA:  Larey Seat today.  Rib fractures and pneumothorax. EXAM: CT CHEST, ABDOMEN, AND PELVIS WITH CONTRAST TECHNIQUE: Multidetector CT imaging of the chest, abdomen and pelvis was performed following the standard protocol during bolus administration of intravenous contrast. CONTRAST:  80mL OMNIPAQUE IOHEXOL 300 MG/ML  SOLN COMPARISON:  Chest x-ray 09/07/2015 and CT abdomen/ pelvis 01/10/2014 FINDINGS: CT CHEST FINDINGS Mediastinum/Lymph Nodes: Right-sided chest wall edema/ hematoma likely related to right clavicle fracture. No chest wall mass, supraclavicular or axillary adenopathy. The thyroid gland is grossly normal. The major vascular structures are patent. The heart is normal in size for age. No pericardial effusion. Moderate tortuosity, ectasia and calcification of the thoracic aorta but no focal aneurysm or dissection. Three-vessel coronary artery calcifications are noted. Small amount of probable mediastinal hematoma likely from the displace clavicle fracture near the sternoclavicular joint. The pulmonary arteries appear normal. The esophagus is grossly normal. Lungs/Pleura: There is a moderate-sized anterior pneumothorax estimated at 15%. There are also small pleural effusions and bibasilar atelectasis. No pulmonary contusion or worrisome pulmonary lesions.  Musculoskeletal: Displaced clavicle fracture noted at the sternoclavicular joint. The anterior second and third ribs are fractured. There is also a fourth rib fracture laterally. No left-sided rib fractures are identified. The vertebral bodies are intact. No sternal fracture. Sternal wires  related to prior bypass surgery. CT ABDOMEN PELVIS FINDINGS Hepatobiliary: No focal hepatic hepatic lesions or intrahepatic biliary dilatation. No acute hepatic injury. The gallbladder is mildly distended. No acute inflammation. No common bile duct dilatation. Pancreas: No mass, inflammation or acute injury. Spleen: Intact.  No acute injury. Adrenals/Urinary Tract: The adrenal glands are normal. There are multiple bilateral renal calculi and renal cortical thinning but no acute renal injury. Stomach/Bowel: The stomach, duodenum, small bowel and colon are grossly normal without oral contrast. Vascular/Lymphatic: Moderate atherosclerotic calcifications involving the aorta and branch vessels but no dissection or focal aneurysm. Fairly significant bilateral renal artery calcifications. Small infrarenal abdominal aortic aneurysm just above the iliac artery bifurcation measuring 3.0 x 3.0 cm. Reproductive: The prostate gland is enlarged. There is significant median lobe hypertrophy projecting well up into the bladder. This appears relatively stable since 2015. Other: Small amount of free pelvic fluid is noted of uncertain significance or etiology. Musculoskeletal: There is a fracture involving the greater trochanter of the left hip. No definite neck or intertrochanteric fracture. There are advanced degenerative changes involving both hips. There is diffuse osteo per [facility] and advanced degenerative changes involving the spine. IMPRESSION: 1. Displaced proximal right clavicle fracture near the sternoclavicular joint with moderate surrounding hematoma. 2. Second, third and fourth right rib fractures. 3. Estimated 15% right-sided  pneumothorax. No pulmonary contusions. Small effusions and bibasilar atelectasis. 4. No acute solid organ injury is identified in the abdomen. There is free pelvic fluid noted in the right pericolic gutter and pelvis of uncertain etiology or significance. Could not exclude a subtle bowel injury. No free air is identified. Recommend close clinical follow-up. 5. Numerous bilateral renal calculi. 6. Advanced atherosclerotic calcifications involving the thoracic and abdominal aortas and branch vessels. 7. Significant median lobe hypertrophy of the prostate gland. 8. Comminuted fracture involving the greater trochanter of the left hip. 9. Left inferior pubic ramus fracture. Electronically Signed   By: Rudie Meyer M.D.   On: 09/07/2015 13:15   Dg Chest Port 1 View  09/09/2015  CLINICAL DATA:  Pneumothorax.  History of atrial fibrillation EXAM: PORTABLE CHEST 1 VIEW COMPARISON:  September 08, 2015. FINDINGS: The previously noted right apical pneumothorax is unchanged in size and position. No tension component is appreciable. Fractures of the lateral right clavicle and right acromion region are stable. Displaced fracture of the lateral right fourth rib is again noted as well. There is persistent right base opacity consistent with either atelectasis or pulmonary contusion, stable. Lungs elsewhere clear. Heart is upper normal in size with pulmonary vascular within normal limits. Patient is status post coronary artery bypass grafting. No adenopathy. There is atherosclerotic calcification in the aorta. IMPRESSION: Essentially no change from 1 day prior. Stable pneumothorax on the right without apparent tension component. Atelectasis versus contusion right base remains stable. Several fractures on the right appear stable. No new opacity. No change in cardiac silhouette. Electronically Signed   By: Bretta Bang III M.D.   On: 09/09/2015 08:05   Dg Chest Port 1 View  09/08/2015  CLINICAL DATA:  Pneumothorax EXAM:  PORTABLE CHEST 1 VIEW COMPARISON:  09/07/2015 FINDINGS: Grossly unchanged enlarged cardiac silhouette and mediastinal contours post median sternotomy and CABG. Atherosclerotic plaque with a tortuous thoracic aorta. Worsening right medial basilar heterogeneous opacities. No pleural effusion. Interval increase in size of small right sided pneumothorax. No definitive mediastinal shift. No evidence of edema. Unchanged bones including displaced right clavicular, acromion and right lateral rib fractures, incompletely evaluated. IMPRESSION: 1. Interval  increase in size of small right-sided pneumothorax. No definite evidence of tension physiology. Continued attention on follow-up is recommended. 2. Worsening right medial basilar heterogeneous opacities favored to represent atelectasis versus contusion. These results will be called to the ordering clinician or representative by the Radiologist Assistant, and communication documented in the PACS or zVision Dashboard. Electronically Signed   By: Simonne Come M.D.   On: 09/08/2015 07:31   Dg Hip Unilat With Pelvis 2-3 Views Left  09/07/2015  CLINICAL DATA:  Found down today. Possibly fell some time during the night. EXAM: DG HIP (WITH OR WITHOUT PELVIS) 2-3V LEFT COMPARISON:  08/06/2015 FINDINGS: Severe left hip joint degenerative changes are again demonstrated. No obvious acute fracture. The right hip is normally located. No definite fracture. The pubic symphysis and SI joints are intact. No definite pelvic fractures. IMPRESSION: No acute hip or pelvic fractures are identified. Severe left hip joint degenerative changes. Electronically Signed   By: Rudie Meyer M.D.   On: 09/07/2015 14:05    Medications: I have reviewed the patient's current medications. Scheduled Meds: . antiseptic oral rinse  7 mL Mouth Rinse BID  . diltiazem  180 mg Oral Daily  . ipratropium-albuterol  3 mL Nebulization BID  . sodium chloride  3 mL Intravenous Q12H   Continuous Infusions:    PRN Meds:.acetaminophen **OR** acetaminophen, fentaNYL (SUBLIMAZE) injection, levalbuterol, ondansetron **OR** ondansetron (ZOFRAN) IV  Assessment/Plan: 81 y.o. Male who has a history of paroxysmal atrial fibrillation, CAD status post 1995 CABG in [place], light stroke. His CHADSVASC is 6. Not on anticoagulation due to multiple GI bleeds. He presented after sustaining a fall from a syncopal episode. The event was unwitnessed. The fall resulted in 3 rib fractures on the right(2, 3 ,4), right frontal lobe laceration, pneumothorax, R prox and distal clavical fracture, R distal acromion fracture, L greater trochanteric fracture. Non-op management per ortho. Troponin is 0.09. No EKG changes to suggest ischemia. He is in NSR.  Principal Problem:   Syncope and collapse Active Problems:   History of coronary artery bypass graft   Paroxysmal atrial fibrillation (HCC)   Systolic murmur   Pneumothorax   Rib fracture   Hip fracture (HCC)   Pelvic fracture (HCC)   Hypertension   Anemia   Fall   Fracture of pubis (HCC)   Anterior displaced fracture of proximal clavicle with delayed healing  Syncope - maintaining SR, EEG without seizure activity , generalized slowing.  CAD s/p CABG with mildly elevated troponin, may be demand ischemia- EKG without acute changes from 07/2015  Hx PAF maintaining SR even if brady Dr Eldridge Dace did not think pt a candidate for PPM or anticoagulation with his dementia even with paf.   ? Sign off   LOS: 2 days   Time spent with pt. :15 minutes. [facility]  Nurse Practitioner Certified Pager [phone] or after 5pm and on weekends call [phone] 09/09/2015, 11:02 AM   I have examined the patient and reviewed assessment and plan and discussed with patient.  Agree with above as stated. No significant issues noted on tele.  Patient still confused.  He thinks it is either [birth year] or 1999.  KNew his name and DOB.  Knew that this was the hospital.  Given his  overall frailty, would not pursue any other cardiac testing.  Please call with questions.    Gilliam Hawkes S.
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
## 2015-09-09 NOTE — Progress Notes (Signed)
Trauma Service Note  Subjective: Patient making sort of a rally.  Able to talk to me today.  Breathing easily.  No acute distress but he has not been out of bed.  Objective: Vital signs in last 24 hours: Temp:  [97.6 F (36.4 C)-98.6 F (37 C)] 97.6 F (36.4 C) (12/16 0500) Pulse Rate:  [79-99] 99 (12/15 2000) Resp:  [14] 14 (12/15 2049) BP: (134-151)/(73-75) 134/75 mmHg (12/15 2049) SpO2:  [100 %] 100 % (12/16 0816) Weight:  [57.788 kg (127 lb 6.4 oz)] 57.788 kg (127 lb 6.4 oz) (12/16 0500)    Intake/Output from previous day: 12/15 0701 - 12/16 0700 In: 200 [P.O.:200] Out: 850 [Urine:850] Intake/Output this shift:    General: No acute distress.  Not out of bed.  Spoke with his daughter for a while, she is pleased with his progress.  Wants appropriate disposition.  Lungs: Clear.  Shallow breathing, but sats are 100% on 2 L by [place].  No chest wall crepitance  CXR pending.  Abd: Soft, benign.  On clear liquid diet  Extremities: The same.  Right arm in sling.  Neuro: Seems appropriate  Lab Results: CBC   Recent Labs  09/07/15 1150 09/08/15 0500  WBC 7.1 5.0  HGB 10.7* 10.2*  HCT 32.2* 31.2*  PLT 201 189   BMET  Recent Labs  09/07/15 1150 09/08/15 0500  NA 139 141  K 3.5 3.5  CL 104 103  CO2 28 30  GLUCOSE 132* 100*  BUN 8 7  CREATININE 0.74 0.82  CALCIUM 8.9 8.9   PT/INR  Recent Labs  09/07/15 2141 09/08/15 0500  LABPROT 15.0 14.6  INR 1.16 1.12   ABG No results for input(s): PHART, HCO3 in the last 72 hours.  Invalid input(s): PCO2, PO2  Studies/Results: Dg Chest 1 View  09/07/2015  CLINICAL DATA:  Fall. EXAM: CHEST 1 VIEW COMPARISON:  December 19, 2013. FINDINGS: The heart size and mediastinal contours are within normal limits. Minimal right apical pneumothorax is noted. Moderately displaced fracture is seen involving the lateral portion of the right fourth rib. Left lung is clear. No significant pleural effusion is noted. Mildly displaced  distal right clavicular fracture is noted, as well as mildly displaced fracture involving the acromion. Atherosclerosis of thoracic aorta is noted. IMPRESSION: Mildly displaced fractures are seen involving the distal right clavicle and acromion. Moderately displaced fracture involving lateral portion of right fourth rib. Minimal right apical pneumothorax is noted. Critical Value/emergent results were called by telephone at the time of interpretation on 09/07/2015 at 12:26 pm to Dr. Tilden Fossa , who verbally acknowledged these results. Electronically Signed   By: Lupita Raider, M.D.   On: 09/07/2015 12:27   Dg Clavicle Right  09/08/2015  CLINICAL DATA:  Clavicle fracture. EXAM: RIGHT CLAVICLE - 2+ VIEWS COMPARISON:  09/08/2015. FINDINGS: Slightly displaced fracture of the distal clavicle is noted. Fracture of the acromion process is noted.Moderate size right apical pneumothorax noted. Displaced fracture of the posterior lateral aspect of the right fourth rib noted. Prior median sternotomy . IMPRESSION: 1. Slightly displaced fracture of the distal clavicle. 2.  Fracture of the acromion process noted. 3. Displaced fracture of the posterior lateral aspect of the right fourth rib noted. 4.  Moderate size right apical pneumothorax. Critical Value/emergent results were called by telephone at the time of interpretation on 09/08/2015 at 8:06 am to nurse [facility] who verbally acknowledged these results. Electronically Signed   By: Maisie Fus  Register   On: 09/08/2015 08:10  Ct Head Wo Contrast  09/07/2015  CLINICAL DATA:  Found laying on bathroom floor, laceration along the right forehead. Fall. EXAM: CT HEAD WITHOUT CONTRAST CT CERVICAL SPINE WITHOUT CONTRAST TECHNIQUE: Multidetector CT imaging of the head and cervical spine was performed following the standard protocol without intravenous contrast. Multiplanar CT image reconstructions of the cervical spine were also generated. COMPARISON:  09/06/2015 FINDINGS: CT  HEAD FINDINGS The brainstem, cerebellum, cerebral peduncles, thalami, basal ganglia, basilar cisterns, and ventricular system appear within normal limits. Periventricular white matter and corona radiata hypodensities favor chronic ischemic microvascular white matter disease. No intracranial hemorrhage, mass lesion, or acute CVA. Right frontal scalp laceration. Acute on chronic right maxillary sinusitis with chronic ethmoid and chronic left maxillary sinusitis. There is atherosclerotic calcification of the cavernous carotid arteries bilaterally. CT CERVICAL SPINE FINDINGS Calcified pannus posterior to the odontoid. Similar appearance of 3 mm degenerative retrolisthesis at C4-5. 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 The left C2- 3 and C3-4 facet joints are fused. There is some bridging interbody spurring at C3-4. Degenerative endplate sclerosis at C4-5 and C6-7 both with posterior osseous ridging. No cervical spine fracture or acute subluxation is identified. There is evidence of moderate central narrowing of the thecal sac at C4-5 due to subluxation and spurring. Osseous right foraminal stenosis is severe at C4-5 and mild to moderate at C3-4. Spurring causes left foraminal stenosis at C3-4, C4-5, and C6-7. No prevertebral soft tissue swelling. There is a small right apical pneumothorax. Fracture the right second rib medially, observed on image 20 series 8. Displaced medial clavicular fracture on the right. Based on conventional radiography today the patient is also thought to have lateral clavicular fracture on the right as well as a lateral acromial fracture. IMPRESSION: 1. Small right apical pneumothorax with fracture of the right second rib medially. Right medial clavicular fracture. Based on today's conventional radiographs the patient is also thought to have a right lateral clavicular fracture as well as other right rib fractures including the right third and fourth ribs. 2. Right  frontal scalp laceration.  No acute intracranial findings. 3. Cervical spondylosis and degenerative disc disease with multilevel impingement, but no acute cervical spine fracture. These results were called by telephone at the time of interpretation on 09/07/2015 at 12:12 pm to Dr. Tilden Fossa , who verbally acknowledged these results. Electronically Signed   By: Gaylyn Rong M.D.   On: 09/07/2015 12:15   Ct Chest W Contrast  09/07/2015  CLINICAL DATA:  Larey Seat today.  Rib fractures and pneumothorax. EXAM: CT CHEST, ABDOMEN, AND PELVIS WITH CONTRAST TECHNIQUE: Multidetector CT imaging of the chest, abdomen and pelvis was performed following the standard protocol during bolus administration of intravenous contrast. CONTRAST:  80mL OMNIPAQUE IOHEXOL 300 MG/ML  SOLN COMPARISON:  Chest x-ray 09/07/2015 and CT abdomen/ pelvis 01/10/2014 FINDINGS: CT CHEST FINDINGS Mediastinum/Lymph Nodes: Right-sided chest wall edema/ hematoma likely related to right clavicle fracture. No chest wall mass, supraclavicular or axillary adenopathy. The thyroid gland is grossly normal. The major vascular structures are patent. The heart is normal in size for age. No pericardial effusion. Moderate tortuosity, ectasia and calcification of the thoracic aorta but no focal aneurysm or dissection. Three-vessel coronary artery calcifications are noted. Small amount of probable mediastinal hematoma likely from the displace clavicle fracture near the sternoclavicular joint. The pulmonary arteries appear normal. The esophagus is grossly normal. Lungs/Pleura: There is a moderate-sized anterior pneumothorax estimated at 15%. There are also small pleural effusions and bibasilar atelectasis. No  pulmonary contusion or worrisome pulmonary lesions. Musculoskeletal: Displaced clavicle fracture noted at the sternoclavicular joint. The anterior second and third ribs are fractured. There is also a fourth rib fracture laterally. No left-sided rib  fractures are identified. The vertebral bodies are intact. No sternal fracture. Sternal wires related to prior bypass surgery. CT ABDOMEN PELVIS FINDINGS Hepatobiliary: No focal hepatic hepatic lesions or intrahepatic biliary dilatation. No acute hepatic injury. The gallbladder is mildly distended. No acute inflammation. No common bile duct dilatation. Pancreas: No mass, inflammation or acute injury. Spleen: Intact.  No acute injury. Adrenals/Urinary Tract: The adrenal glands are normal. There are multiple bilateral renal calculi and renal cortical thinning but no acute renal injury. Stomach/Bowel: The stomach, duodenum, small bowel and colon are grossly normal without oral contrast. Vascular/Lymphatic: Moderate atherosclerotic calcifications involving the aorta and branch vessels but no dissection or focal aneurysm. Fairly significant bilateral renal artery calcifications. Small infrarenal abdominal aortic aneurysm just above the iliac artery bifurcation measuring 3.0 x 3.0 cm. Reproductive: The prostate gland is enlarged. There is significant median lobe hypertrophy projecting well up into the bladder. This appears relatively stable since 2015. Other: Small amount of free pelvic fluid is noted of uncertain significance or etiology. Musculoskeletal: There is a fracture involving the greater trochanter of the left hip. No definite neck or intertrochanteric fracture. There are advanced degenerative changes involving both hips. There is diffuse osteo per [place] and advanced degenerative changes involving the spine. IMPRESSION: 1. Displaced proximal right clavicle fracture near the sternoclavicular joint with moderate surrounding hematoma. 2. Second, third and fourth right rib fractures. 3. Estimated 15% right-sided pneumothorax. No pulmonary contusions. Small effusions and bibasilar atelectasis. 4. No acute solid organ injury is identified in the abdomen. There is free pelvic fluid noted in the right pericolic gutter  and pelvis of uncertain etiology or significance. Could not exclude a subtle bowel injury. No free air is identified. Recommend close clinical follow-up. 5. Numerous bilateral renal calculi. 6. Advanced atherosclerotic calcifications involving the thoracic and abdominal aortas and branch vessels. 7. Significant median lobe hypertrophy of the prostate gland. 8. Comminuted fracture involving the greater trochanter of the left hip. 9. Left inferior pubic ramus fracture. Electronically Signed   By: Rudie Meyer M.D.   On: 09/07/2015 13:15   Ct Cervical Spine Wo Contrast  09/07/2015  CLINICAL DATA:  Found laying on bathroom floor, laceration along the right forehead. Fall. EXAM: CT HEAD WITHOUT CONTRAST CT CERVICAL SPINE WITHOUT CONTRAST TECHNIQUE: Multidetector CT imaging of the head and cervical spine was performed following the standard protocol without intravenous contrast. Multiplanar CT image reconstructions of the cervical spine were also generated. COMPARISON:  09/06/2015 FINDINGS: CT HEAD FINDINGS The brainstem, cerebellum, cerebral peduncles, thalami, basal ganglia, basilar cisterns, and ventricular system appear within normal limits. Periventricular white matter and corona radiata hypodensities favor chronic ischemic microvascular white matter disease. No intracranial hemorrhage, mass lesion, or acute CVA. Right frontal scalp laceration. Acute on chronic right maxillary sinusitis with chronic ethmoid and chronic left maxillary sinusitis. There is atherosclerotic calcification of the cavernous carotid arteries bilaterally. CT CERVICAL SPINE FINDINGS Calcified pannus posterior to the odontoid. Similar appearance of 3 mm degenerative retrolisthesis at C4-5. 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 The left C2- 3 and C3-4 facet joints are fused. There is some bridging interbody spurring at C3-4. Degenerative endplate sclerosis at C4-5 and C6-7 both with posterior osseous ridging. No  cervical spine fracture or acute subluxation is identified. There is evidence of moderate  central narrowing of the thecal sac at C4-5 due to subluxation and spurring. Osseous right foraminal stenosis is severe at C4-5 and mild to moderate at C3-4. Spurring causes left foraminal stenosis at C3-4, C4-5, and C6-7. No prevertebral soft tissue swelling. There is a small right apical pneumothorax. Fracture the right second rib medially, observed on image 20 series 8. Displaced medial clavicular fracture on the right. Based on conventional radiography today the patient is also thought to have lateral clavicular fracture on the right as well as a lateral acromial fracture. IMPRESSION: 1. Small right apical pneumothorax with fracture of the right second rib medially. Right medial clavicular fracture. Based on today's conventional radiographs the patient is also thought to have a right lateral clavicular fracture as well as other right rib fractures including the right third and fourth ribs. 2. Right frontal scalp laceration.  No acute intracranial findings. 3. Cervical spondylosis and degenerative disc disease with multilevel impingement, but no acute cervical spine fracture. These results were called by telephone at the time of interpretation on 09/07/2015 at 12:12 pm to Dr. Tilden Fossa , who verbally acknowledged these results. Electronically Signed   By: Gaylyn Rong M.D.   On: 09/07/2015 12:15   Ct Abdomen Pelvis W Contrast  09/07/2015  CLINICAL DATA:  Larey Seat today.  Rib fractures and pneumothorax. EXAM: CT CHEST, ABDOMEN, AND PELVIS WITH CONTRAST TECHNIQUE: Multidetector CT imaging of the chest, abdomen and pelvis was performed following the standard protocol during bolus administration of intravenous contrast. CONTRAST:  80mL OMNIPAQUE IOHEXOL 300 MG/ML  SOLN COMPARISON:  Chest x-ray 09/07/2015 and CT abdomen/ pelvis 01/10/2014 FINDINGS: CT CHEST FINDINGS Mediastinum/Lymph Nodes: Right-sided chest wall  edema/ hematoma likely related to right clavicle fracture. No chest wall mass, supraclavicular or axillary adenopathy. The thyroid gland is grossly normal. The major vascular structures are patent. The heart is normal in size for age. No pericardial effusion. Moderate tortuosity, ectasia and calcification of the thoracic aorta but no focal aneurysm or dissection. Three-vessel coronary artery calcifications are noted. Small amount of probable mediastinal hematoma likely from the displace clavicle fracture near the sternoclavicular joint. The pulmonary arteries appear normal. The esophagus is grossly normal. Lungs/Pleura: There is a moderate-sized anterior pneumothorax estimated at 15%. There are also small pleural effusions and bibasilar atelectasis. No pulmonary contusion or worrisome pulmonary lesions. Musculoskeletal: Displaced clavicle fracture noted at the sternoclavicular joint. The anterior second and third ribs are fractured. There is also a fourth rib fracture laterally. No left-sided rib fractures are identified. The vertebral bodies are intact. No sternal fracture. Sternal wires related to prior bypass surgery. CT ABDOMEN PELVIS FINDINGS Hepatobiliary: No focal hepatic hepatic lesions or intrahepatic biliary dilatation. No acute hepatic injury. The gallbladder is mildly distended. No acute inflammation. No common bile duct dilatation. Pancreas: No mass, inflammation or acute injury. Spleen: Intact.  No acute injury. Adrenals/Urinary Tract: The adrenal glands are normal. There are multiple bilateral renal calculi and renal cortical thinning but no acute renal injury. Stomach/Bowel: The stomach, duodenum, small bowel and colon are grossly normal without oral contrast. Vascular/Lymphatic: Moderate atherosclerotic calcifications involving the aorta and branch vessels but no dissection or focal aneurysm. Fairly significant bilateral renal artery calcifications. Small infrarenal abdominal aortic aneurysm just  above the iliac artery bifurcation measuring 3.0 x 3.0 cm. Reproductive: The prostate gland is enlarged. There is significant median lobe hypertrophy projecting well up into the bladder. This appears relatively stable since 2015. Other: Small amount of free pelvic fluid is noted of uncertain significance  or etiology. Musculoskeletal: There is a fracture involving the greater trochanter of the left hip. No definite neck or intertrochanteric fracture. There are advanced degenerative changes involving both hips. There is diffuse osteo per [place] and advanced degenerative changes involving the spine. IMPRESSION: 1. Displaced proximal right clavicle fracture near the sternoclavicular joint with moderate surrounding hematoma. 2. Second, third and fourth right rib fractures. 3. Estimated 15% right-sided pneumothorax. No pulmonary contusions. Small effusions and bibasilar atelectasis. 4. No acute solid organ injury is identified in the abdomen. There is free pelvic fluid noted in the right pericolic gutter and pelvis of uncertain etiology or significance. Could not exclude a subtle bowel injury. No free air is identified. Recommend close clinical follow-up. 5. Numerous bilateral renal calculi. 6. Advanced atherosclerotic calcifications involving the thoracic and abdominal aortas and branch vessels. 7. Significant median lobe hypertrophy of the prostate gland. 8. Comminuted fracture involving the greater trochanter of the left hip. 9. Left inferior pubic ramus fracture. Electronically Signed   By: Rudie MeyerP.  Gallerani M.D.   On: 09/07/2015 13:15   Dg Chest Port 1 View  09/09/2015  CLINICAL DATA:  Pneumothorax.  History of atrial fibrillation EXAM: PORTABLE CHEST 1 VIEW COMPARISON:  September 08, 2015. FINDINGS: The previously noted right apical pneumothorax is unchanged in size and position. No tension component is appreciable. Fractures of the lateral right clavicle and right acromion region are stable. Displaced fracture of the  lateral right fourth rib is again noted as well. There is persistent right base opacity consistent with either atelectasis or pulmonary contusion, stable. Lungs elsewhere clear. Heart is upper normal in size with pulmonary vascular within normal limits. Patient is status post coronary artery bypass grafting. No adenopathy. There is atherosclerotic calcification in the aorta. IMPRESSION: Essentially no change from 1 day prior. Stable pneumothorax on the right without apparent tension component. Atelectasis versus contusion right base remains stable. Several fractures on the right appear stable. No new opacity. No change in cardiac silhouette. Electronically Signed   By: Bretta BangWilliam  Woodruff III M.D.   On: 09/09/2015 08:05   Dg Chest Port 1 View  09/08/2015  CLINICAL DATA:  Pneumothorax EXAM: PORTABLE CHEST 1 VIEW COMPARISON:  09/07/2015 FINDINGS: Grossly unchanged enlarged cardiac silhouette and mediastinal contours post median sternotomy and CABG. Atherosclerotic plaque with a tortuous thoracic aorta. Worsening right medial basilar heterogeneous opacities. No pleural effusion. Interval increase in size of small right sided pneumothorax. No definitive mediastinal shift. No evidence of edema. Unchanged bones including displaced right clavicular, acromion and right lateral rib fractures, incompletely evaluated. IMPRESSION: 1. Interval increase in size of small right-sided pneumothorax. No definite evidence of tension physiology. Continued attention on follow-up is recommended. 2. Worsening right medial basilar heterogeneous opacities favored to represent atelectasis versus contusion. These results will be called to the ordering clinician or representative by the Radiologist Assistant, and communication documented in the PACS or zVision Dashboard. Electronically Signed   By: Simonne ComeJohn  Watts M.D.   On: 09/08/2015 07:31   Dg Hip Unilat With Pelvis 2-3 Views Left  09/07/2015  CLINICAL DATA:  Found down today. Possibly  fell some time during the night. EXAM: DG HIP (WITH OR WITHOUT PELVIS) 2-3V LEFT COMPARISON:  08/06/2015 FINDINGS: Severe left hip joint degenerative changes are again demonstrated. No obvious acute fracture. The right hip is normally located. No definite fracture. The pubic symphysis and SI joints are intact. No definite pelvic fractures. IMPRESSION: No acute hip or pelvic fractures are identified. Severe left hip joint degenerative changes. Electronically  Signed   By: Rudie Meyer M.D.   On: 09/07/2015 14:05    Anti-infectives: Anti-infectives    None      Assessment/Plan: s/p  Advance diet Physical therapy and occupational therapy  Care management and social worker to work on placement CXR no worse.  No indication for CT placement.  Will try to wean off the oxygen.  LOS: 2 days   Marta Lamas. Gae Bon, MD, FACS [phone] Trauma Surgeon 09/09/2015

## 2015-09-09 NOTE — Progress Notes (Signed)
[facility] PROGRESS NOTE  Kristopher [name] WUJ:[id number] DOB: 27-Sep-1931 DOA: 09/07/2015 PCP: Ginette Otto, MD  Brief narrative   81 year old male with history of A. fib (off anticoagulation due to multiple GI bleeds), CAD with history of CABG, AVMs, diabetes mellitus, hypertension, BPH, prior history of stroke with progressive decline in memory for the past few months had unwitnessed syncope at home and was found crawling out in his own from the bathroom by the caregiver. Patient brought to the ED and found to have displaced fracture of his right anterior clavicle with 3 right rib fractures, pneumothorax and right greater trochanter  fracture with laceration to the scalp. Trauma surgery was consulted who recommended admission to medical service and will continue to follow. Patient admitted to stepdown unit for syncope workup. Had mildly elevated troponin is well.  Assessment/Plan: Syncope with collapse Cardiac versus neurogenic. MRI done as outpatient on 12/13 for gradual decline in his impairment in memory which showed no acute findings with advanced atrophy. -Patient has paroxysmal A. fib on monitor. Mildly elevated troponin. TSH normal.  2-D echo with LVH, possibly normal EF and no oral motion abnormality. EEG negative for seizure activity.  -Minimize pain medications due to lethargic. -Trauma surgery  following. -Cardiology recommend no further cardiac testing at this time. He is not a candidate for pacemaker given his worsening dementia and also not a candidate for anticoagulation given a GI bleed. -Continue neuro checks. Swallow evaluation .  PT evaluation. --Skilled nursing facility once stable.  Right-sided traumatic pneumothorax Maintaining O2 sat .90% on 2 L via nasal cannula. Pneumothorax unchanged on follow-up x-ray. Mentation O2 sat on nasal cannula.. Trauma surgery following. Monitor with daily chest x-rays.  Right anterior displaced clavicular fracture and multiple  right rib fractures (2nd. 3rd and 4th) Seen by orthopedics and recommended nonoperative management with sling and non weightbearing -Pain control, nebs and bedside spirometry for a fractures.  Left inferior ramus and left comminuted greater trochanteric fracture  Nonoperative management. Partial rebreathing. Discontinue Foley.  CAD status post CABG Not on any medications. Sees a cardiologist in [place] once a year. Has been following with Dr. scans in [place].  Progressive dementia Being worked up as outpatient. Recent MRI unremarkable except for significant atrophy.  Paroxysmal A. fib Has high chads vasc score but not an anticoagulation given history of significant GI bleed in the past. Rate control with Cardizem.  DVT prophylaxis: SCDs  GI prophylaxis: PPI    Code Status: DO NOT RESUSCITATE Family Communication: Daughter at bedside Disposition Plan: Continue step down monitoring. Will need skilled nursing facility once improved   Consultants:  Trauma surgery  Cardiology  Procedures:  CT head and cervical spine  CT chest, abdomen and pelvis  Antibiotics:  None  HPI/Subjective: Seen and examined. more awake today and answering questions.  Objective: Filed Vitals:   09/09/15 0500 09/09/15 1020  BP:  117/64  Pulse:    Temp: 97.6 F (36.4 C)   Resp:      Intake/Output Summary (Last 24 hours) at 09/09/15 1129 Last data filed at 09/09/15 1021  Gross per 24 hour  Intake    203 ml  Output    850 ml  Net   -647 ml   Filed Weights   09/07/15 1900 09/08/15 0545 09/09/15 0500  Weight: 59.013 kg (130 lb 1.6 oz) 55.747 kg (122 lb 14.4 oz) 57.788 kg (127 lb 6.4 oz)    Exam:   General: Elderly male lying in bed fatigue , not in distress  HEENT: Right frontal laceration (sutured),  dry oral mucosa, supple neck  Chest: Ecchymosis with swelling over anterior left right clavicle with ecchymosis over right lateral chest, diminished breath sounds in the right  lung   Cardiovascular: Normal S1 and S2 with 3/6 systolic murmur, no rubs or gallop  GI: Soft, nondistended, nontender, bowel sounds present, foley in place  Musculoskeletal: Warm, no edema  CNS: Awake to commands and answering to questions.  Data Reviewed: Basic Metabolic Panel:  Recent Labs Lab 09/07/15 1150 09/07/15 2141 09/08/15 0500  NA 139  --  141  K 3.5  --  3.5  CL 104  --  103  CO2 28  --  30  GLUCOSE 132*  --  100*  BUN 8  --  7  CREATININE 0.74  --  0.82  CALCIUM 8.9  --  8.9  MG  --  1.6*  --    Liver Function Tests:  Recent Labs Lab 09/07/15 1150  AST 28  ALT 21  ALKPHOS 130*  BILITOT 1.2  PROT 5.8*  ALBUMIN 3.3*   No results for input(s): LIPASE, AMYLASE in the last 168 hours. No results for input(s): AMMONIA in the last 168 hours. CBC:  Recent Labs Lab 09/07/15 1150 09/08/15 0500  WBC 7.1 5.0  NEUTROABS 6.3  --   HGB 10.7* 10.2*  HCT 32.2* 31.2*  MCV 95.0 95.4  PLT 201 189   Cardiac Enzymes:  Recent Labs Lab 09/07/15 1536 09/07/15 2141 09/08/15 0500  CKTOTAL 234  --   --   TROPONINI  --  0.06* 0.09*   BNP (last 3 results) No results for input(s): BNP in the last 8760 hours.  ProBNP (last 3 results) No results for input(s): PROBNP in the last 8760 hours.  CBG:  Recent Labs Lab 09/07/15 1125 09/08/15 0722 09/09/15 0715  GLUCAP 122* 98 104*    No results found for this or any previous visit (from the past 240 hour(s)).   Studies: Dg Chest 1 View  09/07/2015  CLINICAL DATA:  Fall. EXAM: CHEST 1 VIEW COMPARISON:  December 19, 2013. FINDINGS: The heart size and mediastinal contours are within normal limits. Minimal right apical pneumothorax is noted. Moderately displaced fracture is seen involving the lateral portion of the right fourth rib. Left lung is clear. No significant pleural effusion is noted. Mildly displaced distal right clavicular fracture is noted, as well as mildly displaced fracture involving the acromion.  Atherosclerosis of thoracic aorta is noted. IMPRESSION: Mildly displaced fractures are seen involving the distal right clavicle and acromion. Moderately displaced fracture involving lateral portion of right fourth rib. Minimal right apical pneumothorax is noted. Critical Value/emergent results were called by telephone at the time of interpretation on 09/07/2015 at 12:26 pm to Dr. Tilden Fossa , who verbally acknowledged these results. Electronically Signed   By: Lupita Raider, M.D.   On: 09/07/2015 12:27   Dg Clavicle Right  09/08/2015  CLINICAL DATA:  Clavicle fracture. EXAM: RIGHT CLAVICLE - 2+ VIEWS COMPARISON:  09/08/2015. FINDINGS: Slightly displaced fracture of the distal clavicle is noted. Fracture of the acromion process is noted.Moderate size right apical pneumothorax noted. Displaced fracture of the posterior lateral aspect of the right fourth rib noted. Prior median sternotomy . IMPRESSION: 1. Slightly displaced fracture of the distal clavicle. 2.  Fracture of the acromion process noted. 3. Displaced fracture of the posterior lateral aspect of the right fourth rib noted. 4.  Moderate size right apical pneumothorax. Critical Value/emergent results were  called by telephone at the time of interpretation on 09/08/2015 at 8:06 am to nurse [facility] who verbally acknowledged these results. Electronically Signed   By: Maisie Fus  Register   On: 09/08/2015 08:10   Ct Head Wo Contrast  09/07/2015  CLINICAL DATA:  Found laying on bathroom floor, laceration along the right forehead. Fall. EXAM: CT HEAD WITHOUT CONTRAST CT CERVICAL SPINE WITHOUT CONTRAST TECHNIQUE: Multidetector CT imaging of the head and cervical spine was performed following the standard protocol without intravenous contrast. Multiplanar CT image reconstructions of the cervical spine were also generated. COMPARISON:  09/06/2015 FINDINGS: CT HEAD FINDINGS The brainstem, cerebellum, cerebral peduncles, thalami, basal ganglia, basilar cisterns, and  ventricular system appear within normal limits. Periventricular white matter and corona radiata hypodensities favor chronic ischemic microvascular white matter disease. No intracranial hemorrhage, mass lesion, or acute CVA. Right frontal scalp laceration. Acute on chronic right maxillary sinusitis with chronic ethmoid and chronic left maxillary sinusitis. There is atherosclerotic calcification of the cavernous carotid arteries bilaterally. CT CERVICAL SPINE FINDINGS Calcified pannus posterior to the odontoid. Similar appearance of 3 mm degenerative retrolisthesis at C4-5. 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 The left C2- 3 and C3-4 facet joints are fused. There is some bridging interbody spurring at C3-4. Degenerative endplate sclerosis at C4-5 and C6-7 both with posterior osseous ridging. No cervical spine fracture or acute subluxation is identified. There is evidence of moderate central narrowing of the thecal sac at C4-5 due to subluxation and spurring. Osseous right foraminal stenosis is severe at C4-5 and mild to moderate at C3-4. Spurring causes left foraminal stenosis at C3-4, C4-5, and C6-7. No prevertebral soft tissue swelling. There is a small right apical pneumothorax. Fracture the right second rib medially, observed on image 20 series 8. Displaced medial clavicular fracture on the right. Based on conventional radiography today the patient is also thought to have lateral clavicular fracture on the right as well as a lateral acromial fracture. IMPRESSION: 1. Small right apical pneumothorax with fracture of the right second rib medially. Right medial clavicular fracture. Based on today's conventional radiographs the patient is also thought to have a right lateral clavicular fracture as well as other right rib fractures including the right third and fourth ribs. 2. Right frontal scalp laceration.  No acute intracranial findings. 3. Cervical spondylosis and degenerative disc  disease with multilevel impingement, but no acute cervical spine fracture. These results were called by telephone at the time of interpretation on 09/07/2015 at 12:12 pm to Dr. Tilden Fossa , who verbally acknowledged these results. Electronically Signed   By: Gaylyn Rong M.D.   On: 09/07/2015 12:15   Ct Chest W Contrast  09/07/2015  CLINICAL DATA:  Larey Seat today.  Rib fractures and pneumothorax. EXAM: CT CHEST, ABDOMEN, AND PELVIS WITH CONTRAST TECHNIQUE: Multidetector CT imaging of the chest, abdomen and pelvis was performed following the standard protocol during bolus administration of intravenous contrast. CONTRAST:  80mL OMNIPAQUE IOHEXOL 300 MG/ML  SOLN COMPARISON:  Chest x-ray 09/07/2015 and CT abdomen/ pelvis 01/10/2014 FINDINGS: CT CHEST FINDINGS Mediastinum/Lymph Nodes: Right-sided chest wall edema/ hematoma likely related to right clavicle fracture. No chest wall mass, supraclavicular or axillary adenopathy. The thyroid gland is grossly normal. The major vascular structures are patent. The heart is normal in size for age. No pericardial effusion. Moderate tortuosity, ectasia and calcification of the thoracic aorta but no focal aneurysm or dissection. Three-vessel coronary artery calcifications are noted. Small amount of probable mediastinal hematoma likely from the displace  clavicle fracture near the sternoclavicular joint. The pulmonary arteries appear normal. The esophagus is grossly normal. Lungs/Pleura: There is a moderate-sized anterior pneumothorax estimated at 15%. There are also small pleural effusions and bibasilar atelectasis. No pulmonary contusion or worrisome pulmonary lesions. Musculoskeletal: Displaced clavicle fracture noted at the sternoclavicular joint. The anterior second and third ribs are fractured. There is also a fourth rib fracture laterally. No left-sided rib fractures are identified. The vertebral bodies are intact. No sternal fracture. Sternal wires related to prior  bypass surgery. CT ABDOMEN PELVIS FINDINGS Hepatobiliary: No focal hepatic hepatic lesions or intrahepatic biliary dilatation. No acute hepatic injury. The gallbladder is mildly distended. No acute inflammation. No common bile duct dilatation. Pancreas: No mass, inflammation or acute injury. Spleen: Intact.  No acute injury. Adrenals/Urinary Tract: The adrenal glands are normal. There are multiple bilateral renal calculi and renal cortical thinning but no acute renal injury. Stomach/Bowel: The stomach, duodenum, small bowel and colon are grossly normal without oral contrast. Vascular/Lymphatic: Moderate atherosclerotic calcifications involving the aorta and branch vessels but no dissection or focal aneurysm. Fairly significant bilateral renal artery calcifications. Small infrarenal abdominal aortic aneurysm just above the iliac artery bifurcation measuring 3.0 x 3.0 cm. Reproductive: The prostate gland is enlarged. There is significant median lobe hypertrophy projecting well up into the bladder. This appears relatively stable since 2015. Other: Small amount of free pelvic fluid is noted of uncertain significance or etiology. Musculoskeletal: There is a fracture involving the greater trochanter of the left hip. No definite neck or intertrochanteric fracture. There are advanced degenerative changes involving both hips. There is diffuse osteo per [place] and advanced degenerative changes involving the spine. IMPRESSION: 1. Displaced proximal right clavicle fracture near the sternoclavicular joint with moderate surrounding hematoma. 2. Second, third and fourth right rib fractures. 3. Estimated 15% right-sided pneumothorax. No pulmonary contusions. Small effusions and bibasilar atelectasis. 4. No acute solid organ injury is identified in the abdomen. There is free pelvic fluid noted in the right pericolic gutter and pelvis of uncertain etiology or significance. Could not exclude a subtle bowel injury. No free air is  identified. Recommend close clinical follow-up. 5. Numerous bilateral renal calculi. 6. Advanced atherosclerotic calcifications involving the thoracic and abdominal aortas and branch vessels. 7. Significant median lobe hypertrophy of the prostate gland. 8. Comminuted fracture involving the greater trochanter of the left hip. 9. Left inferior pubic ramus fracture. Electronically Signed   By: Rudie MeyerP.  Gallerani M.D.   On: 09/07/2015 13:15   Ct Cervical Spine Wo Contrast  09/07/2015  CLINICAL DATA:  Found laying on bathroom floor, laceration along the right forehead. Fall. EXAM: CT HEAD WITHOUT CONTRAST CT CERVICAL SPINE WITHOUT CONTRAST TECHNIQUE: Multidetector CT imaging of the head and cervical spine was performed following the standard protocol without intravenous contrast. Multiplanar CT image reconstructions of the cervical spine were also generated. COMPARISON:  09/06/2015 FINDINGS: CT HEAD FINDINGS The brainstem, cerebellum, cerebral peduncles, thalami, basal ganglia, basilar cisterns, and ventricular system appear within normal limits. Periventricular white matter and corona radiata hypodensities favor chronic ischemic microvascular white matter disease. No intracranial hemorrhage, mass lesion, or acute CVA. Right frontal scalp laceration. Acute on chronic right maxillary sinusitis with chronic ethmoid and chronic left maxillary sinusitis. There is atherosclerotic calcification of the cavernous carotid arteries bilaterally. CT CERVICAL SPINE FINDINGS Calcified pannus posterior to the odontoid. Similar appearance of 3 mm degenerative retrolisthesis at C4-5. 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 The left C2- 3 and C3-4 facet joints are  fused. There is some bridging interbody spurring at C3-4. Degenerative endplate sclerosis at C4-5 and C6-7 both with posterior osseous ridging. No cervical spine fracture or acute subluxation is identified. There is evidence of moderate central  narrowing of the thecal sac at C4-5 due to subluxation and spurring. Osseous right foraminal stenosis is severe at C4-5 and mild to moderate at C3-4. Spurring causes left foraminal stenosis at C3-4, C4-5, and C6-7. No prevertebral soft tissue swelling. There is a small right apical pneumothorax. Fracture the right second rib medially, observed on image 20 series 8. Displaced medial clavicular fracture on the right. Based on conventional radiography today the patient is also thought to have lateral clavicular fracture on the right as well as a lateral acromial fracture. IMPRESSION: 1. Small right apical pneumothorax with fracture of the right second rib medially. Right medial clavicular fracture. Based on today's conventional radiographs the patient is also thought to have a right lateral clavicular fracture as well as other right rib fractures including the right third and fourth ribs. 2. Right frontal scalp laceration.  No acute intracranial findings. 3. Cervical spondylosis and degenerative disc disease with multilevel impingement, but no acute cervical spine fracture. These results were called by telephone at the time of interpretation on 09/07/2015 at 12:12 pm to Dr. Tilden Fossa , who verbally acknowledged these results. Electronically Signed   By: Gaylyn Rong M.D.   On: 09/07/2015 12:15   Ct Abdomen Pelvis W Contrast  09/07/2015  CLINICAL DATA:  Larey Seat today.  Rib fractures and pneumothorax. EXAM: CT CHEST, ABDOMEN, AND PELVIS WITH CONTRAST TECHNIQUE: Multidetector CT imaging of the chest, abdomen and pelvis was performed following the standard protocol during bolus administration of intravenous contrast. CONTRAST:  80mL OMNIPAQUE IOHEXOL 300 MG/ML  SOLN COMPARISON:  Chest x-ray 09/07/2015 and CT abdomen/ pelvis 01/10/2014 FINDINGS: CT CHEST FINDINGS Mediastinum/Lymph Nodes: Right-sided chest wall edema/ hematoma likely related to right clavicle fracture. No chest wall mass, supraclavicular or  axillary adenopathy. The thyroid gland is grossly normal. The major vascular structures are patent. The heart is normal in size for age. No pericardial effusion. Moderate tortuosity, ectasia and calcification of the thoracic aorta but no focal aneurysm or dissection. Three-vessel coronary artery calcifications are noted. Small amount of probable mediastinal hematoma likely from the displace clavicle fracture near the sternoclavicular joint. The pulmonary arteries appear normal. The esophagus is grossly normal. Lungs/Pleura: There is a moderate-sized anterior pneumothorax estimated at 15%. There are also small pleural effusions and bibasilar atelectasis. No pulmonary contusion or worrisome pulmonary lesions. Musculoskeletal: Displaced clavicle fracture noted at the sternoclavicular joint. The anterior second and third ribs are fractured. There is also a fourth rib fracture laterally. No left-sided rib fractures are identified. The vertebral bodies are intact. No sternal fracture. Sternal wires related to prior bypass surgery. CT ABDOMEN PELVIS FINDINGS Hepatobiliary: No focal hepatic hepatic lesions or intrahepatic biliary dilatation. No acute hepatic injury. The gallbladder is mildly distended. No acute inflammation. No common bile duct dilatation. Pancreas: No mass, inflammation or acute injury. Spleen: Intact.  No acute injury. Adrenals/Urinary Tract: The adrenal glands are normal. There are multiple bilateral renal calculi and renal cortical thinning but no acute renal injury. Stomach/Bowel: The stomach, duodenum, small bowel and colon are grossly normal without oral contrast. Vascular/Lymphatic: Moderate atherosclerotic calcifications involving the aorta and branch vessels but no dissection or focal aneurysm. Fairly significant bilateral renal artery calcifications. Small infrarenal abdominal aortic aneurysm just above the iliac artery bifurcation measuring 3.0 x 3.0 cm. Reproductive:  The prostate gland is  enlarged. There is significant median lobe hypertrophy projecting well up into the bladder. This appears relatively stable since 2015. Other: Small amount of free pelvic fluid is noted of uncertain significance or etiology. Musculoskeletal: There is a fracture involving the greater trochanter of the left hip. No definite neck or intertrochanteric fracture. There are advanced degenerative changes involving both hips. There is diffuse osteo per [place] and advanced degenerative changes involving the spine. IMPRESSION: 1. Displaced proximal right clavicle fracture near the sternoclavicular joint with moderate surrounding hematoma. 2. Second, third and fourth right rib fractures. 3. Estimated 15% right-sided pneumothorax. No pulmonary contusions. Small effusions and bibasilar atelectasis. 4. No acute solid organ injury is identified in the abdomen. There is free pelvic fluid noted in the right pericolic gutter and pelvis of uncertain etiology or significance. Could not exclude a subtle bowel injury. No free air is identified. Recommend close clinical follow-up. 5. Numerous bilateral renal calculi. 6. Advanced atherosclerotic calcifications involving the thoracic and abdominal aortas and branch vessels. 7. Significant median lobe hypertrophy of the prostate gland. 8. Comminuted fracture involving the greater trochanter of the left hip. 9. Left inferior pubic ramus fracture. Electronically Signed   By: Rudie Meyer M.D.   On: 09/07/2015 13:15   Dg Chest Port 1 View  09/09/2015  CLINICAL DATA:  Pneumothorax.  History of atrial fibrillation EXAM: PORTABLE CHEST 1 VIEW COMPARISON:  September 08, 2015. FINDINGS: The previously noted right apical pneumothorax is unchanged in size and position. No tension component is appreciable. Fractures of the lateral right clavicle and right acromion region are stable. Displaced fracture of the lateral right fourth rib is again noted as well. There is persistent right base opacity  consistent with either atelectasis or pulmonary contusion, stable. Lungs elsewhere clear. Heart is upper normal in size with pulmonary vascular within normal limits. Patient is status post coronary artery bypass grafting. No adenopathy. There is atherosclerotic calcification in the aorta. IMPRESSION: Essentially no change from 1 day prior. Stable pneumothorax on the right without apparent tension component. Atelectasis versus contusion right base remains stable. Several fractures on the right appear stable. No new opacity. No change in cardiac silhouette. Electronically Signed   By: Bretta Bang III M.D.   On: 09/09/2015 08:05   Dg Chest Port 1 View  09/08/2015  CLINICAL DATA:  Pneumothorax EXAM: PORTABLE CHEST 1 VIEW COMPARISON:  09/07/2015 FINDINGS: Grossly unchanged enlarged cardiac silhouette and mediastinal contours post median sternotomy and CABG. Atherosclerotic plaque with a tortuous thoracic aorta. Worsening right medial basilar heterogeneous opacities. No pleural effusion. Interval increase in size of small right sided pneumothorax. No definitive mediastinal shift. No evidence of edema. Unchanged bones including displaced right clavicular, acromion and right lateral rib fractures, incompletely evaluated. IMPRESSION: 1. Interval increase in size of small right-sided pneumothorax. No definite evidence of tension physiology. Continued attention on follow-up is recommended. 2. Worsening right medial basilar heterogeneous opacities favored to represent atelectasis versus contusion. These results will be called to the ordering clinician or representative by the Radiologist Assistant, and communication documented in the PACS or zVision Dashboard. Electronically Signed   By: Simonne Come M.D.   On: 09/08/2015 07:31   Dg Hip Unilat With Pelvis 2-3 Views Left  09/07/2015  CLINICAL DATA:  Found down today. Possibly fell some time during the night. EXAM: DG HIP (WITH OR WITHOUT PELVIS) 2-3V LEFT  COMPARISON:  08/06/2015 FINDINGS: Severe left hip joint degenerative changes are again demonstrated. No obvious acute fracture. The  right hip is normally located. No definite fracture. The pubic symphysis and SI joints are intact. No definite pelvic fractures. IMPRESSION: No acute hip or pelvic fractures are identified. Severe left hip joint degenerative changes. Electronically Signed   By: Rudie Meyer M.D.   On: 09/07/2015 14:05    Scheduled Meds: . antiseptic oral rinse  7 mL Mouth Rinse BID  . diltiazem  180 mg Oral Daily  . ipratropium-albuterol  3 mL Nebulization BID  . sodium chloride  3 mL Intravenous Q12H   Continuous Infusions:      Time spent: 25 minutes    Milka Windholz  [facility] Pager [phone] If 7PM-7AM, please contact night-coverage at [email or web address], password [facility] 09/09/2015, 11:29 AM  LOS: 2 days

## 2015-09-10 ENCOUNTER — Inpatient Hospital Stay (HOSPITAL_COMMUNITY): Payer: Medicare Other

## 2015-09-10 DIAGNOSIS — F0391 Unspecified dementia with behavioral disturbance: Secondary | ICD-10-CM

## 2015-09-10 LAB — GLUCOSE, CAPILLARY: Glucose-Capillary: 83 mg/dL (ref 65–99)

## 2015-09-10 NOTE — Evaluation (Signed)
Physical Therapy Evaluation Patient Details Name: Kristopher [name] MRN: [id number] DOB: 11-03-1931 Today's Date: 09/10/2015   History of Present Illness  Patient is a 81 y/o male with hx of A. fib, CABG, AVMs, DM, HTN, BPH and prior stroke who presents from sonic home after being found in the bathroom this morning. Initial evaluation in the emergency department reveals 3 rib fractures on the right(2, 3 ,4), right frontal lobe laceration, pneumothorax, R prox and distal clavical fracture, R distal acromion fracture, L greater trochanteric fracture. Non-op management per ortho  Clinical Impression  Patient presents with functional limitations due to deficits listed in PT problem list (see below). Pt with generalized weakness, soreness, new WB statuses RUE/LLE and confusion s/p fall impacting safe mobility. Tolerated SPT with total A of 2 to chair. Pt not able to maintain compliance of TDWB LLE- not sure pt comprehends at this time. Not safe to return to ILF. Would benefit from ST SNF to maximize independence and mobility and minimize fall risk prior to return home.    Follow Up Recommendations SNF    Equipment Recommendations  Other (comment) (defer to next venue)    Recommendations for Other Services OT consult     Precautions / Restrictions Precautions Precautions: Fall Required Braces or Orthoses: Sling Restrictions Weight Bearing Restrictions: Yes RUE Weight Bearing: Non weight bearing Other Position/Activity Restrictions: PWB LLE      Mobility  Bed Mobility Overal bed mobility: Needs Assistance Bed Mobility: Supine to Sit     Supine to sit: +2 for physical assistance;HOB elevated;Max assist     General bed mobility comments: Assist of 2 to bring LLE to EOB, scoot bottom and elevate trunk. Cues for step by step sequencing.   Transfers Overall transfer level: Needs assistance   Transfers: Stand Pivot Transfers   Stand pivot transfers: Total assist;+2 physical assistance        General transfer comment: Stand pivot transfer towards right side using chuck pad and belt for support. Able shuffle feet minimally but not compliant with PWB LLE.  Ambulation/Gait Ambulation/Gait assistance:  (Deferred)              Stairs            Wheelchair Mobility    Modified Rankin (Stroke Patients Only)       Balance Overall balance assessment: Needs assistance Sitting-balance support: Feet supported;Single extremity supported Sitting balance-Leahy Scale: Fair Sitting balance - Comments: Able to sit unsupported performing there ex LEs.   Standing balance support: During functional activity Standing balance-Leahy Scale: Zero Standing balance comment: Requires total A of 2 for standing/transfer to chair. not compliant with PWB LLE.                             Pertinent Vitals/Pain Pain Assessment: Faces Faces Pain Scale: Hurts whole lot Pain Location: everywhere Pain Descriptors / Indicators: Grimacing;Guarding;Discomfort Pain Intervention(s): Monitored during session;Repositioned;Limited activity within patient's tolerance    Home Living Family/patient expects to be discharged to:: Skilled nursing facility                      Prior Function Level of Independence: Needs assistance   Gait / Transfers Assistance Needed: Reports using RW PTA and drived. ILF provided meals.      Comments: Pt seems confused today so not sure of accuracy of reported PLOF.     Hand Dominance        Extremity/Trunk  Assessment   Upper Extremity Assessment: Defer to OT evaluation (RUE in sling.)           Lower Extremity Assessment: Generalized weakness (Pain with movement of LLE.)      Cervical / Trunk Assessment: Kyphotic  Communication   Communication: HOH  Cognition Arousal/Alertness: Awake/alert   Overall Cognitive Status: Impaired/Different from baseline Area of Impairment: Orientation;Safety/judgement;Problem  solving;Awareness;Following commands;Memory Orientation Level: Disoriented to;Place;Time;Situation   Memory: Decreased short-term memory Following Commands: Follows one step commands with increased time;Follows one step commands inconsistently Safety/Judgement: Decreased awareness of deficits Awareness: Intellectual Problem Solving: Slow processing;Decreased initiation;Difficulty sequencing;Requires verbal cues;Requires tactile cues General Comments: [facility] and [facility] about affording his room here. nonsensical speech at times answering questions noy related to questions asked. Having a hard time comprehending WB restrictions.    General Comments      Exercises General Exercises - Lower Extremity Long Arc Quad: Both;5 reps;Seated      Assessment/Plan    PT Assessment Patient needs continued PT services  PT Diagnosis Difficulty walking;Acute pain;Altered mental status   PT Problem List Decreased strength;Decreased range of motion;Decreased cognition;Pain;Decreased balance;Decreased mobility;Decreased activity tolerance;Decreased knowledge of precautions;Decreased safety awareness  PT Treatment Interventions Balance training;Functional mobility training;Therapeutic activities;Therapeutic exercise;Patient/family education;Gait training;Neuromuscular re-education;Wheelchair mobility training   PT Goals (Current goals can be found in the Care Plan section) Acute Rehab PT Goals Patient Stated Goal: i want to go home PT Goal Formulation: Patient unable to participate in goal setting Time For Goal Achievement: 09/24/15 Potential to Achieve Goals: Fair    Frequency Min 3X/week   Barriers to discharge Decreased caregiver support LIves alone    Co-evaluation               End of Session Equipment Utilized During Treatment: Gait belt Activity Tolerance: Patient limited by pain Patient left: in chair;with call bell/phone within reach;with chair alarm  set Nurse Communication: Mobility status (RN informed of transfer technique and need for 2 people to get pt back to bed)         Time: 1130-1154 PT Time Calculation (min) (ACUTE ONLY): 24 min   Charges:   PT Evaluation $Initial PT Evaluation Tier I: 1 Procedure PT Treatments $Therapeutic Activity: 8-22 mins   PT G Codes:        Aylin Rhoads A Zafiro Routson 09/10/2015, 12:57 PM  Mylo Red, PT, DPT [phone]

## 2015-09-10 NOTE — Progress Notes (Signed)
[facility] PROGRESS NOTE  Kristopher [name] ZOX:[id number] DOB: 04/30/1932 DOA: 09/07/2015 PCP: Ginette Otto, MD  Brief narrative   81 year old male with history of A. fib (off anticoagulation due to multiple GI bleeds), CAD with history of CABG, AVMs, diabetes mellitus, hypertension, BPH, prior history of stroke with progressive decline in memory for the past few months had unwitnessed syncope at home and was found crawling out in his own from the bathroom by the caregiver. Patient brought to the ED and found to have displaced fracture of his right anterior clavicle with 3 right rib fractures, pneumothorax and right greater trochanter  fracture with laceration to the scalp. Trauma surgery was consulted who recommended admission to medical service and will continue to follow. Patient admitted to stepdown unit for syncope workup. Had mildly elevated troponin is well.  Assessment/Plan: Syncope with collapse Cardiac versus neurogenic. MRI done as outpatient on 12/13 for gradual decline in his impairment in memory which showed no acute findings with advanced atrophy. -Patient has paroxysmal A. fib on monitor. Mildly elevated troponin. TSH normal.  2-D echo with LVH, possibly normal EF and no oral motion abnormality. EEG negative for seizure activity.  -Minimize pain medications due to lethargic. -Trauma surgery  following. -Cardiology recommend no further cardiac testing at this time. He is not a candidate for pacemaker given his worsening dementia and also not a candidate for anticoagulation given a GI bleed. -Continue neuro checks. Swallow evaluation .  PT evaluation recommend skilled nursing facility.   Right-sided traumatic pneumothorax Maintaining O2 sat in room air. Subsequent follow-up chest she shows increase in size of the pneumothorax.. . Trauma surgery following.  Right anterior displaced clavicular fracture and multiple right rib fractures (2nd. 3rd and 4th) Seen by  orthopedics and recommended nonoperative management with sling and non weightbearing -Pain control, nebs and bedside spirometry   Left inferior ramus and left comminuted greater trochanteric fracture  Nonoperative management. Partial rebreathing. Discontinue Foley.  CAD status post CABG Not on any medications. Sees a cardiologist in [place] once a year. Has been following with Dr. scans in [place].  Progressive dementia Being worked up as outpatient. Recent MRI unremarkable except for significant atrophy.  Paroxysmal A. fib Has high chads vasc score but not an anticoagulation given history of significant GI bleed in the past. Rate control with Cardizem.  DVT prophylaxis: SCDs  GI prophylaxis: PPI    Code Status: DO NOT RESUSCITATE Family Communication: Daughter at bedside Disposition Plan: Skilled nursing facility once clinically improved. May need few days.   Consultants:  Trauma surgery  Cardiology  Procedures:  CT head and cervical spine  CT chest, abdomen and pelvis  Antibiotics:  None  HPI/Subjective: Seen and examined. more awake today and answering a few questions but remains confused.  Objective: Filed Vitals:   09/10/15 1316 09/10/15 1411  BP: 129/79   Pulse: 80   Temp:  98 F (36.7 C)  Resp: 19     Intake/Output Summary (Last 24 hours) at 09/10/15 1439 Last data filed at 09/10/15 1300  Gross per 24 hour  Intake    270 ml  Output    950 ml  Net   -680 ml   Filed Weights   09/08/15 0545 09/09/15 0500 09/10/15 0639  Weight: 55.747 kg (122 lb 14.4 oz) 57.788 kg (127 lb 6.4 oz) 57.38 kg (126 lb 8 oz)    Exam:   General: Elderly male lying in bed fatigue , not in distress  HEENT: Right frontal laceration (sutured),  moist mucosa  Chest: Ecchymosis with swelling over anterior left right clavicle with ecchymosis over right lateral chest, diminished breath sounds in the right lung   Cardiovascular: Normal S1 and S2 with 3/6 systolic  murmur,  GI: Soft, nondistended, nontender, bowel sounds present,   Musculoskeletal: Warm, no edema  CNS: Alert and awake. Confused.  Data Reviewed: Basic Metabolic Panel:  Recent Labs Lab 09/07/15 1150 09/07/15 2141 09/08/15 0500  NA 139  --  141  K 3.5  --  3.5  CL 104  --  103  CO2 28  --  30  GLUCOSE 132*  --  100*  BUN 8  --  7  CREATININE 0.74  --  0.82  CALCIUM 8.9  --  8.9  MG  --  1.6*  --    Liver Function Tests:  Recent Labs Lab 09/07/15 1150  AST 28  ALT 21  ALKPHOS 130*  BILITOT 1.2  PROT 5.8*  ALBUMIN 3.3*   No results for input(s): LIPASE, AMYLASE in the last 168 hours. No results for input(s): AMMONIA in the last 168 hours. CBC:  Recent Labs Lab 09/07/15 1150 09/08/15 0500  WBC 7.1 5.0  NEUTROABS 6.3  --   HGB 10.7* 10.2*  HCT 32.2* 31.2*  MCV 95.0 95.4  PLT 201 189   Cardiac Enzymes:  Recent Labs Lab 09/07/15 1536 09/07/15 2141 09/08/15 0500  CKTOTAL 234  --   --   TROPONINI  --  0.06* 0.09*   BNP (last 3 results) No results for input(s): BNP in the last 8760 hours.  ProBNP (last 3 results) No results for input(s): PROBNP in the last 8760 hours.  CBG:  Recent Labs Lab 09/07/15 1125 09/08/15 0722 09/09/15 0715 09/10/15 0747  GLUCAP 122* 98 104* 83    No results found for this or any previous visit (from the past 240 hour(s)).   Studies: Dg Chest Port 1 View  09/10/2015  CLINICAL DATA:  Patient with history of right-sided pneumothorax. EXAM: PORTABLE CHEST 1 VIEW COMPARISON:  Chest radiograph 09/09/2015. FINDINGS: Patient is rotated. Stable cardiac and mediastinal contours status post median sternotomy. No large area of pulmonary consolidation. Interval decrease in size of now tiny right apical pneumothorax. No definite pleural effusion. Re- demonstrated right upper lateral rib fractures and right clavicle fractures. IMPRESSION: Interval decrease in size of small right apical pneumothorax. Re- demonstrated medial and  distal right clavicle fractures. Re- demonstrated right rib fractures. Electronically Signed   By: Annia Beltrew  Davis M.D.   On: 09/10/2015 09:50   Dg Chest Port 1 View  09/09/2015  CLINICAL DATA:  Pneumothorax.  History of atrial fibrillation EXAM: PORTABLE CHEST 1 VIEW COMPARISON:  September 08, 2015. FINDINGS: The previously noted right apical pneumothorax is unchanged in size and position. No tension component is appreciable. Fractures of the lateral right clavicle and right acromion region are stable. Displaced fracture of the lateral right fourth rib is again noted as well. There is persistent right base opacity consistent with either atelectasis or pulmonary contusion, stable. Lungs elsewhere clear. Heart is upper normal in size with pulmonary vascular within normal limits. Patient is status post coronary artery bypass grafting. No adenopathy. There is atherosclerotic calcification in the aorta. IMPRESSION: Essentially no change from 1 day prior. Stable pneumothorax on the right without apparent tension component. Atelectasis versus contusion right base remains stable. Several fractures on the right appear stable. No new opacity. No change in cardiac silhouette. Electronically Signed   By: Bretta BangWilliam  [place]  III M.D.   On: 09/09/2015 08:05    Scheduled Meds: . antiseptic oral rinse  7 mL Mouth Rinse BID  . diltiazem  180 mg Oral Daily  . ipratropium-albuterol  3 mL Nebulization BID  . sodium chloride  3 mL Intravenous Q12H   Continuous Infusions:      Time spent: 25 minutes    Latricia Cerrito  [facility] Pager [phone] If 7PM-7AM, please contact night-coverage at [email or web address], password [facility] 09/10/2015, 2:39 PM  LOS: 3 days

## 2015-09-11 LAB — GLUCOSE, CAPILLARY: Glucose-Capillary: 88 mg/dL (ref 65–99)

## 2015-09-11 MED ORDER — DILTIAZEM HCL ER COATED BEADS 120 MG PO CP24
120.0000 mg | ORAL_CAPSULE | Freq: Every day | ORAL | Status: DC
  Administered 2015-09-12: 120 mg via ORAL
  Filled 2015-09-11: qty 1

## 2015-09-11 NOTE — Progress Notes (Signed)
OT Cancellation Note  Patient Details Name: Kristopher [name] MRN: [id number] DOB: March 05, 1932   Cancelled Treatment:    Reason Eval/Treat Not Completed: OT screened, no needs identified, will sign off - Pt with plan for SNF level rehab, and OT eval not needed for authorization.  Will defer OT needs to SNF>   Angelene GiovanniConarpe, Kristopher [name]  Kim [place], OTR/L [phone]  09/11/2015, 10:44 AM

## 2015-09-11 NOTE — Progress Notes (Signed)
[facility] PROGRESS NOTE  Kristopher [name] ONG:[id number] DOB: [birth year] DOA: 09/07/2015 PCP: Ginette Otto, MD  Brief narrative   81 year old male with history of A. fib (off anticoagulation due to multiple GI bleeds), CAD with history of CABG, AVMs, diabetes mellitus, hypertension, BPH, prior history of stroke with progressive decline in memory for the past few months had unwitnessed syncope at home and was found crawling out in his own from the bathroom by the caregiver. Patient brought to the ED and found to have displaced fracture of his right anterior clavicle with 3 right rib fractures, pneumothorax and right greater trochanter  fracture with laceration to the scalp. Trauma surgery was consulted who recommended admission to medical service and will continue to follow. Patient admitted to stepdown unit for syncope workup. Had mildly elevated troponin is well.  Assessment/Plan: Syncope with collapse Cardiac versus neurogenic. MRI done as outpatient on 12/13 for gradual decline in his impairment in memory which showed no acute findings with advanced atrophy. -Patient has paroxysmal A. fib on monitor. Mildly elevated troponin. TSH normal.  2-D echo with LVH, possibly normal EF and no oral motion abnormality. EEG negative for seizure activity.  -Minimize pain medications due to lethargic. -Trauma surgery  following. -Cardiology recommend no further cardiac testing at this time. He is not a candidate for pacemaker given his worsening dementia and also not a candidate for anticoagulation given a GI bleed. -Mental status is better today. Swallow evaluation .  PT evaluation recommend skilled nursing facility.   Right-sided traumatic pneumothorax Maintaining O2 sat in room air. Subsequent follow-up chest she shows increase in size of the pneumothorax.. . Trauma surgery following.  Right anterior displaced clavicular fracture and multiple right rib fractures (2nd. 3rd and 4th) Seen by  orthopedics and recommended nonoperative management with sling and non weightbearing -Pain control, nebs and bedside spirometry   Left inferior ramus and left comminuted greater trochanteric fracture  Nonoperative management. Partial rebreathing. Discontinue Foley.  CAD status post CABG Not on any medications. Sees a cardiologist in [place] once a year. Has been following with Dr. scans in [place].  Progressive dementia Being worked up as outpatient. Recent MRI unremarkable except for significant atrophy.  Paroxysmal A. fib Has high chads vasc score but not an anticoagulation given history of significant GI bleed in the past. Rate control with Cardizem. Dose reduced due to soft blood pressure.  DVT prophylaxis: SCDs  GI prophylaxis: PPI    Code Status: DO NOT RESUSCITATE Family Communication: Daughter at bedside Disposition Plan: Skilled nursing facility tomorrow   Consultants:  Trauma surgery  Cardiology  Procedures:  CT head and cervical spine  CT chest, abdomen and pelvis  Antibiotics:  None  HPI/Subjective: Seen and examined.  more awake and answering better today.  Objective: Filed Vitals:   09/11/15 0907 09/11/15 1418  BP: 110/80 94/44  Pulse:  87  Temp:  98.6 F (37 C)  Resp:  20    Intake/Output Summary (Last 24 hours) at 09/11/15 1432 Last data filed at 09/11/15 1255  Gross per 24 hour  Intake    460 ml  Output    676 ml  Net   -216 ml   Filed Weights   09/09/15 0500 09/10/15 0639 09/11/15 0500  Weight: 57.788 kg (127 lb 6.4 oz) 57.38 kg (126 lb 8 oz) 52.028 kg (114 lb 11.2 oz)    Exam:   General:  not in distress  HEENT: Right frontal laceration (sutured),  moist mucosa  Chest: Ecchymosis with  swelling over anterior left right clavicle with ecchymosis over right lateral chest, diminished breath sounds in the right lung   Cardiovascular: Normal S1 and S2 with 3/6 systolic murmur,  GI: Soft, nondistended,  nontender,  Musculoskeletal: Warm, no edema  CNS: Alert and awake. More oriented but still confused  Data Reviewed: Basic Metabolic Panel:  Recent Labs Lab 09/07/15 1150 09/07/15 2141 09/08/15 0500  NA 139  --  141  K 3.5  --  3.5  CL 104  --  103  CO2 28  --  30  GLUCOSE 132*  --  100*  BUN 8  --  7  CREATININE 0.74  --  0.82  CALCIUM 8.9  --  8.9  MG  --  1.6*  --    Liver Function Tests:  Recent Labs Lab 09/07/15 1150  AST 28  ALT 21  ALKPHOS 130*  BILITOT 1.2  PROT 5.8*  ALBUMIN 3.3*   No results for input(s): LIPASE, AMYLASE in the last 168 hours. No results for input(s): AMMONIA in the last 168 hours. CBC:  Recent Labs Lab 09/07/15 1150 09/08/15 0500  WBC 7.1 5.0  NEUTROABS 6.3  --   HGB 10.7* 10.2*  HCT 32.2* 31.2*  MCV 95.0 95.4  PLT 201 189   Cardiac Enzymes:  Recent Labs Lab 09/07/15 1536 09/07/15 2141 09/08/15 0500  CKTOTAL 234  --   --   TROPONINI  --  0.06* 0.09*   BNP (last 3 results) No results for input(s): BNP in the last 8760 hours.  ProBNP (last 3 results) No results for input(s): PROBNP in the last 8760 hours.  CBG:  Recent Labs Lab 09/07/15 1125 09/08/15 0722 09/09/15 0715 09/10/15 0747 09/11/15 0718  GLUCAP 122* 98 104* 83 88    No results found for this or any previous visit (from the past 240 hour(s)).   Studies: Dg Chest Port 1 View  09/10/2015  CLINICAL DATA:  Patient with history of right-sided pneumothorax. EXAM: PORTABLE CHEST 1 VIEW COMPARISON:  Chest radiograph 09/09/2015. FINDINGS: Patient is rotated. Stable cardiac and mediastinal contours status post median sternotomy. No large area of pulmonary consolidation. Interval decrease in size of now tiny right apical pneumothorax. No definite pleural effusion. Re- demonstrated right upper lateral rib fractures and right clavicle fractures. IMPRESSION: Interval decrease in size of small right apical pneumothorax. Re- demonstrated medial and distal right  clavicle fractures. Re- demonstrated right rib fractures. Electronically Signed   By: Annia Beltrew  Davis M.D.   On: 09/10/2015 09:50    Scheduled Meds: . antiseptic oral rinse  7 mL Mouth Rinse BID  . [START ON 09/12/2015] diltiazem  120 mg Oral Daily  . ipratropium-albuterol  3 mL Nebulization BID  . sodium chloride  3 mL Intravenous Q12H   Continuous Infusions:      Time spent: 25 minutes    Shlome Baldree  [facility] Pager [phone] If 7PM-7AM, please contact night-coverage at [email or web address], password [email or web address] 09/11/2015, 2:32 PM  LOS: 4 days

## 2015-09-12 DIAGNOSIS — S2231XS Fracture of one rib, right side, sequela: Secondary | ICD-10-CM

## 2015-09-12 DIAGNOSIS — J939 Pneumothorax, unspecified: Secondary | ICD-10-CM | POA: Insufficient documentation

## 2015-09-12 DIAGNOSIS — F0280 Dementia in other diseases classified elsewhere without behavioral disturbance: Secondary | ICD-10-CM | POA: Diagnosis present

## 2015-09-12 DIAGNOSIS — I2510 Atherosclerotic heart disease of native coronary artery without angina pectoris: Secondary | ICD-10-CM

## 2015-09-12 DIAGNOSIS — E43 Unspecified severe protein-calorie malnutrition: Secondary | ICD-10-CM

## 2015-09-12 DIAGNOSIS — W19XXXD Unspecified fall, subsequent encounter: Secondary | ICD-10-CM

## 2015-09-12 LAB — GLUCOSE, CAPILLARY: GLUCOSE-CAPILLARY: 87 mg/dL (ref 65–99)

## 2015-09-12 MED ORDER — TRAMADOL-ACETAMINOPHEN 37.5-325 MG PO TABS: 1.0000 | ORAL_TABLET | Freq: Four times a day (QID) | ORAL | Status: DC | PRN

## 2015-09-12 MED ORDER — ENSURE ENLIVE PO LIQD: 237.0000 mL | Freq: Three times a day (TID) | ORAL | Status: DC

## 2015-09-12 MED ORDER — DILTIAZEM HCL ER COATED BEADS 120 MG PO CP24: 120.0000 mg | ORAL_CAPSULE | Freq: Every day | ORAL | Status: DC

## 2015-09-12 MED ORDER — LEVALBUTEROL HCL 0.63 MG/3ML IN NEBU: 0.6300 mg | INHALATION_SOLUTION | RESPIRATORY_TRACT | Status: DC | PRN

## 2015-09-12 NOTE — Discharge Summary (Signed)
Physician Discharge Summary  Kristopher [name] ZOX:[id number] DOB: 1931-12-27 DOA: 09/07/2015  PCP: Ginette OttoSTONEKING,HAL THOMAS, MD  Admit date: 09/07/2015 Discharge date: 09/12/2015  Time spent: 35 minutes  Recommendations for Outpatient Follow-up:  1. Discharged to skilled nursing facility. 2. Follow-up with orthopedics Dr. Margarita Ranaimothy Murphy in 2 weeks 3. If patient does not show improvement or has progressive functional decline would recommend palliative care evaluation. 4.  5.    Discharge Diagnoses:  Principal Problem:   Syncope and collapse   Active Problems:   Atherosclerosis of native coronary artery of native heart without angina pectoris   History of coronary artery bypass graft   Paroxysmal atrial fibrillation (HCC)   Systolic murmur   Pneumothorax   Rib fracture   Hip fracture (HCC)   Pelvic fracture (HCC)   Hypertension   Anemia   Fall   Fracture of pubis (HCC)   Anterior displaced fracture of proximal clavicle with delayed healing   Dementia due to general medical condition without behavioral disturbance   Protein-calorie malnutrition, severe (HCC)   Discharge Condition: Guarded  Diet recommendation: Heart healthy with supplements  CODE STATUS: DO NOT RESUSCITATE  Filed Weights   09/10/15 0639 09/11/15 0500 09/12/15 0500  Weight: 57.38 kg (126 lb 8 oz) 52.028 kg (114 lb 11.2 oz) 51.302 kg (113 lb 1.6 oz)    History of present illness:  Please refer to admission H&P for details, in brief,81 year old male with history of A. fib (off anticoagulation due to multiple GI bleeds), CAD with history of CABG, AVMs, diabetes mellitus, hypertension, BPH, prior history of stroke with progressive decline in memory for the past few months had unwitnessed syncope at home and was found crawling out in his own from the bathroom by the caregiver. Patient brought to the ED and found to have displaced fracture of his right anterior clavicle with 3 right rib fractures, pneumothorax and  right greater trochanter fracture with laceration to the scalp. Trauma surgery was consulted who recommended admission to medical service and will continue to follow. Patient admitted to stepdown unit for syncope workup. Had mildly elevated troponin is well.  Hospital Course:  Syncope with collapse Cardiac versus neurogenic. He is on both finasteride and Flomax that could cause orthostasis as well. MRI done as outpatient on 12/13 for gradual decline in his impairment in memory which showed no acute findings with advanced atrophy. -Patient has paroxysmal A. fib on monitor. Mildly elevated troponin. TSH normal. 2-D echo with LVH, possibly normal EF and no wall motion abnormality. EEG negative for seizure activity.  -Minimized pain medications due to lethargic. -Trauma surgery following and recommendations appreciated. Managed conservative early. -Cardiology recommend no further cardiac testing at this time. He is not a candidate for pacemaker given his worsening dementia and also not a candidate for anticoagulation given a GI bleed. -Mental status has improved has underlying confusion. Cleared by speech and swallow for regular diet.. PT evaluation recommend skilled nursing facility.   Right-sided traumatic pneumothorax Maintaining O2 sat in room air. Subsequent follow-up chest she shows decrease in size of the pneumothorax.. . Patient followed by trauma surgery. No intervention needed. Return for any hypoxia or respiratory distress as outpatient.  Right anterior displaced clavicular fracture and multiple right rib fractures (2nd. 3rd and 4th) Seen by orthopedics and recommended nonoperative management with sling and non weightbearing on his right arm. -Pain control (has required minimal pain medications), nebs and bedside spirometry .  Left inferior ramus and left comminuted greater trochanteric fracture  Nonoperative management.  Partial nonweightbearing recommended.  CAD status post  CABG Not on any medications. Sees a cardiologist in [place] once a year. Has been following with Dr. Anne Fu in [place].  Progressive dementia Being worked up as outpatient. Recent MRI unremarkable except for significant atrophy.  Paroxysmal A. fib Has high chads vasc score but not an anticoagulation given history of significant GI bleed in the past. Rate control with Cardizem. Dose reduced due to soft blood pressure.  GERD Continue PPI  Severe protein calorie malnutrition Seen by nutritionist and added supplement      Family Communication: Daughter at bedside Disposition Plan: Skilled nursing facility   Consultants:  Trauma surgery  Cardiology  Procedures:  CT head and cervical spine  CT chest, abdomen and pelvis  2-D echo  EEG  Antibiotics:  None    Discharge Exam: Filed Vitals:   09/11/15 2100 09/12/15 0500  BP: 126/99 135/68  Pulse: 88 85  Temp: 98.6 F (37 C) 99.5 F (37.5 C)  Resp: 18 21     General: not in distress  HEENT: Right frontal laceration (sutured), moist mucosa  Chest: Ecchymosis with swelling over anterior left right clavicle with ecchymosis over right lateral chest, diminished breath sounds in the right lung   Cardiovascular: Normal S1 and S2 with 3/6 systolic murmur,  GI: Soft, nondistended, nontender,  Musculoskeletal: Warm, no edema  CNS: Alert and awake. More oriented but still confused  Discharge Instructions    Current Discharge Medication List    START taking these medications   Details  feeding supplement, ENSURE ENLIVE, (ENSURE ENLIVE) LIQD Take 237 mLs by mouth 3 (three) times daily between meals. Qty: 237 mL, Refills: 12    levalbuterol (XOPENEX) 0.63 MG/3ML nebulizer solution Take 3 mLs (0.63 mg total) by nebulization every 4 (four) hours as needed for wheezing or shortness of breath. Qty: 3 mL, Refills: 12    traMADol-acetaminophen (ULTRACET) 37.5-325 MG tablet Take 1 tablet by mouth every 6  (six) hours as needed for moderate pain. Qty: 30 tablet, Refills: 0      CONTINUE these medications which have CHANGED   Details  diltiazem (CARDIZEM CD) 120 MG 24 hr capsule Take 1 capsule (120 mg total) by mouth daily. Qty: 30 capsule, Refills: 0      CONTINUE these medications which have NOT CHANGED   Details  acetaminophen (TYLENOL ARTHRITIS PAIN) 650 MG CR tablet Take 650-1,300 mg by mouth at bedtime as needed for pain.    allopurinol (ZYLOPRIM) 300 MG tablet Take 300 mg by mouth daily.     finasteride (PROSCAR) 5 MG tablet Take 5 mg by mouth daily.     levalbuterol (XOPENEX HFA) 45 MCG/ACT inhaler Inhale 1-2 puffs into the lungs every 4 (four) hours as needed for wheezing.    montelukast (SINGULAIR) 10 MG tablet Take 10 mg by mouth daily.     pantoprazole (PROTONIX) 40 MG tablet Take 1 tablet (40 mg total) by mouth 2 (two) times daily. Qty: 60 tablet, Refills: 0    tamsulosin (FLOMAX) 0.4 MG CAPS capsule Take 0.4 mg by mouth daily.        Allergies  Allergen Reactions  . Codeine     unknown  . Other Other (See Comments)    All pain medications cause a hangover effect  . Sulfa Antibiotics     unknown   Follow-up Information    Please follow up.   Why:  MD at SNF      Follow up with MURPHY, TIMOTHY  D, MD. Call in 2 weeks.   Specialty:  Orthopedic Surgery   Contact information:   [address] [place] [address] [phone]        The results of significant diagnostics from this hospitalization (including imaging, microbiology, ancillary and laboratory) are listed below for reference.    Significant Diagnostic Studies: Dg Chest 1 View  09/07/2015  CLINICAL DATA:  Fall. EXAM: CHEST 1 VIEW COMPARISON:  December 19, 2013. FINDINGS: The heart size and mediastinal contours are within normal limits. Minimal right apical pneumothorax is noted. Moderately displaced fracture is seen involving the lateral portion of the right fourth rib. Left lung  is clear. No significant pleural effusion is noted. Mildly displaced distal right clavicular fracture is noted, as well as mildly displaced fracture involving the acromion. Atherosclerosis of thoracic aorta is noted. IMPRESSION: Mildly displaced fractures are seen involving the distal right clavicle and acromion. Moderately displaced fracture involving lateral portion of right fourth rib. Minimal right apical pneumothorax is noted. Critical Value/emergent results were called by telephone at the time of interpretation on 09/07/2015 at 12:26 pm to Dr. Tilden Fossa , who verbally acknowledged these results. Electronically Signed   By: Lupita Raider, M.D.   On: 09/07/2015 12:27   Dg Clavicle Right  09/08/2015  CLINICAL DATA:  Clavicle fracture. EXAM: RIGHT CLAVICLE - 2+ VIEWS COMPARISON:  09/08/2015. FINDINGS: Slightly displaced fracture of the distal clavicle is noted. Fracture of the acromion process is noted.Moderate size right apical pneumothorax noted. Displaced fracture of the posterior lateral aspect of the right fourth rib noted. Prior median sternotomy . IMPRESSION: 1. Slightly displaced fracture of the distal clavicle. 2.  Fracture of the acromion process noted. 3. Displaced fracture of the posterior lateral aspect of the right fourth rib noted. 4.  Moderate size right apical pneumothorax. Critical Value/emergent results were called by telephone at the time of interpretation on 09/08/2015 at 8:06 am to nurse [facility] who verbally acknowledged these results. Electronically Signed   By: Maisie Fus  Register   On: 09/08/2015 08:10   Ct Head Wo Contrast  09/07/2015  CLINICAL DATA:  Found laying on bathroom floor, laceration along the right forehead. Fall. EXAM: CT HEAD WITHOUT CONTRAST CT CERVICAL SPINE WITHOUT CONTRAST TECHNIQUE: Multidetector CT imaging of the head and cervical spine was performed following the standard protocol without intravenous contrast. Multiplanar CT image reconstructions of the  cervical spine were also generated. COMPARISON:  09/06/2015 FINDINGS: CT HEAD FINDINGS The brainstem, cerebellum, cerebral peduncles, thalami, basal ganglia, basilar cisterns, and ventricular system appear within normal limits. Periventricular white matter and corona radiata hypodensities favor chronic ischemic microvascular white matter disease. No intracranial hemorrhage, mass lesion, or acute CVA. Right frontal scalp laceration. Acute on chronic right maxillary sinusitis with chronic ethmoid and chronic left maxillary sinusitis. There is atherosclerotic calcification of the cavernous carotid arteries bilaterally. CT CERVICAL SPINE FINDINGS Calcified pannus posterior to the odontoid. Similar appearance of 3 mm degenerative retrolisthesis at C4-5. 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 The left C2- 3 and C3-4 facet joints are fused. There is some bridging interbody spurring at C3-4. Degenerative endplate sclerosis at C4-5 and C6-7 both with posterior osseous ridging. No cervical spine fracture or acute subluxation is identified. There is evidence of moderate central narrowing of the thecal sac at C4-5 due to subluxation and spurring. Osseous right foraminal stenosis is severe at C4-5 and mild to moderate at C3-4. Spurring causes left foraminal stenosis at C3-4, C4-5, and C6-7.  No prevertebral soft tissue swelling. There is a small right apical pneumothorax. Fracture the right second rib medially, observed on image 20 series 8. Displaced medial clavicular fracture on the right. Based on conventional radiography today the patient is also thought to have lateral clavicular fracture on the right as well as a lateral acromial fracture. IMPRESSION: 1. Small right apical pneumothorax with fracture of the right second rib medially. Right medial clavicular fracture. Based on today's conventional radiographs the patient is also thought to have a right lateral clavicular fracture as well as other  right rib fractures including the right third and fourth ribs. 2. Right frontal scalp laceration.  No acute intracranial findings. 3. Cervical spondylosis and degenerative disc disease with multilevel impingement, but no acute cervical spine fracture. These results were called by telephone at the time of interpretation on 09/07/2015 at 12:12 pm to Dr. Tilden Fossa , who verbally acknowledged these results. Electronically Signed   By: Gaylyn Rong M.D.   On: 09/07/2015 12:15   Ct Chest W Contrast  09/07/2015  CLINICAL DATA:  Larey Seat today.  Rib fractures and pneumothorax. EXAM: CT CHEST, ABDOMEN, AND PELVIS WITH CONTRAST TECHNIQUE: Multidetector CT imaging of the chest, abdomen and pelvis was performed following the standard protocol during bolus administration of intravenous contrast. CONTRAST:  80mL OMNIPAQUE IOHEXOL 300 MG/ML  SOLN COMPARISON:  Chest x-ray 09/07/2015 and CT abdomen/ pelvis 01/10/2014 FINDINGS: CT CHEST FINDINGS Mediastinum/Lymph Nodes: Right-sided chest wall edema/ hematoma likely related to right clavicle fracture. No chest wall mass, supraclavicular or axillary adenopathy. The thyroid gland is grossly normal. The major vascular structures are patent. The heart is normal in size for age. No pericardial effusion. Moderate tortuosity, ectasia and calcification of the thoracic aorta but no focal aneurysm or dissection. Three-vessel coronary artery calcifications are noted. Small amount of probable mediastinal hematoma likely from the displace clavicle fracture near the sternoclavicular joint. The pulmonary arteries appear normal. The esophagus is grossly normal. Lungs/Pleura: There is a moderate-sized anterior pneumothorax estimated at 15%. There are also small pleural effusions and bibasilar atelectasis. No pulmonary contusion or worrisome pulmonary lesions. Musculoskeletal: Displaced clavicle fracture noted at the sternoclavicular joint. The anterior second and third ribs are fractured.  There is also a fourth rib fracture laterally. No left-sided rib fractures are identified. The vertebral bodies are intact. No sternal fracture. Sternal wires related to prior bypass surgery. CT ABDOMEN PELVIS FINDINGS Hepatobiliary: No focal hepatic hepatic lesions or intrahepatic biliary dilatation. No acute hepatic injury. The gallbladder is mildly distended. No acute inflammation. No common bile duct dilatation. Pancreas: No mass, inflammation or acute injury. Spleen: Intact.  No acute injury. Adrenals/Urinary Tract: The adrenal glands are normal. There are multiple bilateral renal calculi and renal cortical thinning but no acute renal injury. Stomach/Bowel: The stomach, duodenum, small bowel and colon are grossly normal without oral contrast. Vascular/Lymphatic: Moderate atherosclerotic calcifications involving the aorta and branch vessels but no dissection or focal aneurysm. Fairly significant bilateral renal artery calcifications. Small infrarenal abdominal aortic aneurysm just above the iliac artery bifurcation measuring 3.0 x 3.0 cm. Reproductive: The prostate gland is enlarged. There is significant median lobe hypertrophy projecting well up into the bladder. This appears relatively stable since 2015. Other: Small amount of free pelvic fluid is noted of uncertain significance or etiology. Musculoskeletal: There is a fracture involving the greater trochanter of the left hip. No definite neck or intertrochanteric fracture. There are advanced degenerative changes involving both hips. There is diffuse osteo per [facility] and advanced degenerative  changes involving the spine. IMPRESSION: 1. Displaced proximal right clavicle fracture near the sternoclavicular joint with moderate surrounding hematoma. 2. Second, third and fourth right rib fractures. 3. Estimated 15% right-sided pneumothorax. No pulmonary contusions. Small effusions and bibasilar atelectasis. 4. No acute solid organ injury is identified in the abdomen.  There is free pelvic fluid noted in the right pericolic gutter and pelvis of uncertain etiology or significance. Could not exclude a subtle bowel injury. No free air is identified. Recommend close clinical follow-up. 5. Numerous bilateral renal calculi. 6. Advanced atherosclerotic calcifications involving the thoracic and abdominal aortas and branch vessels. 7. Significant median lobe hypertrophy of the prostate gland. 8. Comminuted fracture involving the greater trochanter of the left hip. 9. Left inferior pubic ramus fracture. Electronically Signed   By: Rudie Meyer M.D.   On: 09/07/2015 13:15   Ct Cervical Spine Wo Contrast  09/07/2015  CLINICAL DATA:  Found laying on bathroom floor, laceration along the right forehead. Fall. EXAM: CT HEAD WITHOUT CONTRAST CT CERVICAL SPINE WITHOUT CONTRAST TECHNIQUE: Multidetector CT imaging of the head and cervical spine was performed following the standard protocol without intravenous contrast. Multiplanar CT image reconstructions of the cervical spine were also generated. COMPARISON:  09/06/2015 FINDINGS: CT HEAD FINDINGS The brainstem, cerebellum, cerebral peduncles, thalami, basal ganglia, basilar cisterns, and ventricular system appear within normal limits. Periventricular white matter and corona radiata hypodensities favor chronic ischemic microvascular white matter disease. No intracranial hemorrhage, mass lesion, or acute CVA. Right frontal scalp laceration. Acute on chronic right maxillary sinusitis with chronic ethmoid and chronic left maxillary sinusitis. There is atherosclerotic calcification of the cavernous carotid arteries bilaterally. CT CERVICAL SPINE FINDINGS Calcified pannus posterior to the odontoid. Similar appearance of 3 mm degenerative retrolisthesis at C4-5. 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 The left C2- 3 and C3-4 facet joints are fused. There is some bridging interbody spurring at C3-4. Degenerative endplate  sclerosis at C4-5 and C6-7 both with posterior osseous ridging. No cervical spine fracture or acute subluxation is identified. There is evidence of moderate central narrowing of the thecal sac at C4-5 due to subluxation and spurring. Osseous right foraminal stenosis is severe at C4-5 and mild to moderate at C3-4. Spurring causes left foraminal stenosis at C3-4, C4-5, and C6-7. No prevertebral soft tissue swelling. There is a small right apical pneumothorax. Fracture the right second rib medially, observed on image 20 series 8. Displaced medial clavicular fracture on the right. Based on conventional radiography today the patient is also thought to have lateral clavicular fracture on the right as well as a lateral acromial fracture. IMPRESSION: 1. Small right apical pneumothorax with fracture of the right second rib medially. Right medial clavicular fracture. Based on today's conventional radiographs the patient is also thought to have a right lateral clavicular fracture as well as other right rib fractures including the right third and fourth ribs. 2. Right frontal scalp laceration.  No acute intracranial findings. 3. Cervical spondylosis and degenerative disc disease with multilevel impingement, but no acute cervical spine fracture. These results were called by telephone at the time of interpretation on 09/07/2015 at 12:12 pm to Dr. Tilden Fossa , who verbally acknowledged these results. Electronically Signed   By: Gaylyn Rong M.D.   On: 09/07/2015 12:15   Mr Brain Wo Contrast  09/06/2015  CLINICAL DATA:  Aphasia, memory loss and confusion for 1.5 years increasing in frequency. Stroke risk factors include atrial fibrillation, hypertension, and CABG. Head injury February 2015. EXAM:  MRI HEAD WITHOUT CONTRAST TECHNIQUE: Multiplanar, multiecho pulse sequences of the brain and surrounding structures were obtained without intravenous contrast. COMPARISON:  CT head 12/19/2013. FINDINGS: No evidence for acute  infarction, hemorrhage, mass lesion, or extra-axial fluid. Advanced cerebral and cerebellar atrophy. Hydrocephalus ex vacuo. Mild to moderate T2 and FLAIR hyperintensities throughout periventricular and subcortical white matter representing chronic microvascular ischemic change. Remote LEFT parietal and LEFT temporal cortical and subcortical infarcts. Dolichoectatic but widely patent cerebral vasculature. Cervical spondylosis with suspected spinal stenosis at C3-4 and C4-5. Unremarkable pituitary and cerebellar tonsils. No osseous findings. Extracranial soft tissues unremarkable. Chronic RIGHT maxillary sinus disease with retention cyst formation. BILATERAL cataract extraction appears uncomplicated. Compared with the prior CT in 2015, progression of atrophy is noted. The chronic areas of cortical infarction were observed on the scan, but there is increasing diffuse cortical volume loss. IMPRESSION: No acute intracranial findings. Advanced atrophy with mild to moderate small vessel disease. Remote LEFT parietal and LEFT temporal infarcts. Cervical spondylosis. Electronically Signed   By: Elsie Stain M.D.   On: 09/06/2015 13:42   Ct Abdomen Pelvis W Contrast  09/07/2015  CLINICAL DATA:  Larey Seat today.  Rib fractures and pneumothorax. EXAM: CT CHEST, ABDOMEN, AND PELVIS WITH CONTRAST TECHNIQUE: Multidetector CT imaging of the chest, abdomen and pelvis was performed following the standard protocol during bolus administration of intravenous contrast. CONTRAST:  80mL OMNIPAQUE IOHEXOL 300 MG/ML  SOLN COMPARISON:  Chest x-ray 09/07/2015 and CT abdomen/ pelvis 01/10/2014 FINDINGS: CT CHEST FINDINGS Mediastinum/Lymph Nodes: Right-sided chest wall edema/ hematoma likely related to right clavicle fracture. No chest wall mass, supraclavicular or axillary adenopathy. The thyroid gland is grossly normal. The major vascular structures are patent. The heart is normal in size for age. No pericardial effusion. Moderate tortuosity,  ectasia and calcification of the thoracic aorta but no focal aneurysm or dissection. Three-vessel coronary artery calcifications are noted. Small amount of probable mediastinal hematoma likely from the displace clavicle fracture near the sternoclavicular joint. The pulmonary arteries appear normal. The esophagus is grossly normal. Lungs/Pleura: There is a moderate-sized anterior pneumothorax estimated at 15%. There are also small pleural effusions and bibasilar atelectasis. No pulmonary contusion or worrisome pulmonary lesions. Musculoskeletal: Displaced clavicle fracture noted at the sternoclavicular joint. The anterior second and third ribs are fractured. There is also a fourth rib fracture laterally. No left-sided rib fractures are identified. The vertebral bodies are intact. No sternal fracture. Sternal wires related to prior bypass surgery. CT ABDOMEN PELVIS FINDINGS Hepatobiliary: No focal hepatic hepatic lesions or intrahepatic biliary dilatation. No acute hepatic injury. The gallbladder is mildly distended. No acute inflammation. No common bile duct dilatation. Pancreas: No mass, inflammation or acute injury. Spleen: Intact.  No acute injury. Adrenals/Urinary Tract: The adrenal glands are normal. There are multiple bilateral renal calculi and renal cortical thinning but no acute renal injury. Stomach/Bowel: The stomach, duodenum, small bowel and colon are grossly normal without oral contrast. Vascular/Lymphatic: Moderate atherosclerotic calcifications involving the aorta and branch vessels but no dissection or focal aneurysm. Fairly significant bilateral renal artery calcifications. Small infrarenal abdominal aortic aneurysm just above the iliac artery bifurcation measuring 3.0 x 3.0 cm. Reproductive: The prostate gland is enlarged. There is significant median lobe hypertrophy projecting well up into the bladder. This appears relatively stable since 2015. Other: Small amount of free pelvic fluid is noted of  uncertain significance or etiology. Musculoskeletal: There is a fracture involving the greater trochanter of the left hip. No definite neck or intertrochanteric fracture. There are advanced  degenerative changes involving both hips. There is diffuse osteo per [facility] and advanced degenerative changes involving the spine. IMPRESSION: 1. Displaced proximal right clavicle fracture near the sternoclavicular joint with moderate surrounding hematoma. 2. Second, third and fourth right rib fractures. 3. Estimated 15% right-sided pneumothorax. No pulmonary contusions. Small effusions and bibasilar atelectasis. 4. No acute solid organ injury is identified in the abdomen. There is free pelvic fluid noted in the right pericolic gutter and pelvis of uncertain etiology or significance. Could not exclude a subtle bowel injury. No free air is identified. Recommend close clinical follow-up. 5. Numerous bilateral renal calculi. 6. Advanced atherosclerotic calcifications involving the thoracic and abdominal aortas and branch vessels. 7. Significant median lobe hypertrophy of the prostate gland. 8. Comminuted fracture involving the greater trochanter of the left hip. 9. Left inferior pubic ramus fracture. Electronically Signed   By: Rudie Meyer M.D.   On: 09/07/2015 13:15   Dg Chest Port 1 View  09/10/2015  CLINICAL DATA:  Patient with history of right-sided pneumothorax. EXAM: PORTABLE CHEST 1 VIEW COMPARISON:  Chest radiograph 09/09/2015. FINDINGS: Patient is rotated. Stable cardiac and mediastinal contours status post median sternotomy. No large area of pulmonary consolidation. Interval decrease in size of now tiny right apical pneumothorax. No definite pleural effusion. Re- demonstrated right upper lateral rib fractures and right clavicle fractures. IMPRESSION: Interval decrease in size of small right apical pneumothorax. Re- demonstrated medial and distal right clavicle fractures. Re- demonstrated right rib fractures.  Electronically Signed   By: Annia Belt M.D.   On: 09/10/2015 09:50   Dg Chest Port 1 View  09/09/2015  CLINICAL DATA:  Pneumothorax.  History of atrial fibrillation EXAM: PORTABLE CHEST 1 VIEW COMPARISON:  September 08, 2015. FINDINGS: The previously noted right apical pneumothorax is unchanged in size and position. No tension component is appreciable. Fractures of the lateral right clavicle and right acromion region are stable. Displaced fracture of the lateral right fourth rib is again noted as well. There is persistent right base opacity consistent with either atelectasis or pulmonary contusion, stable. Lungs elsewhere clear. Heart is upper normal in size with pulmonary vascular within normal limits. Patient is status post coronary artery bypass grafting. No adenopathy. There is atherosclerotic calcification in the aorta. IMPRESSION: Essentially no change from 1 day prior. Stable pneumothorax on the right without apparent tension component. Atelectasis versus contusion right base remains stable. Several fractures on the right appear stable. No new opacity. No change in cardiac silhouette. Electronically Signed   By: Bretta Bang III M.D.   On: 09/09/2015 08:05   Dg Chest Port 1 View  09/08/2015  CLINICAL DATA:  Pneumothorax EXAM: PORTABLE CHEST 1 VIEW COMPARISON:  09/07/2015 FINDINGS: Grossly unchanged enlarged cardiac silhouette and mediastinal contours post median sternotomy and CABG. Atherosclerotic plaque with a tortuous thoracic aorta. Worsening right medial basilar heterogeneous opacities. No pleural effusion. Interval increase in size of small right sided pneumothorax. No definitive mediastinal shift. No evidence of edema. Unchanged bones including displaced right clavicular, acromion and right lateral rib fractures, incompletely evaluated. IMPRESSION: 1. Interval increase in size of small right-sided pneumothorax. No definite evidence of tension physiology. Continued attention on follow-up is  recommended. 2. Worsening right medial basilar heterogeneous opacities favored to represent atelectasis versus contusion. These results will be called to the ordering clinician or representative by the Radiologist Assistant, and communication documented in the PACS or zVision Dashboard. Electronically Signed   By: Simonne Come M.D.   On: 09/08/2015 07:31   Dg  Hip Unilat With Pelvis 2-3 Views Left  09/07/2015  CLINICAL DATA:  Found down today. Possibly fell some time during the night. EXAM: DG HIP (WITH OR WITHOUT PELVIS) 2-3V LEFT COMPARISON:  08/06/2015 FINDINGS: Severe left hip joint degenerative changes are again demonstrated. No obvious acute fracture. The right hip is normally located. No definite fracture. The pubic symphysis and SI joints are intact. No definite pelvic fractures. IMPRESSION: No acute hip or pelvic fractures are identified. Severe left hip joint degenerative changes. Electronically Signed   By: Rudie Meyer M.D.   On: 09/07/2015 14:05    Microbiology: No results found for this or any previous visit (from the past 240 hour(s)).   Labs: Basic Metabolic Panel:  Recent Labs Lab 09/07/15 1150 09/07/15 2141 09/08/15 0500  NA 139  --  141  K 3.5  --  3.5  CL 104  --  103  CO2 28  --  30  GLUCOSE 132*  --  100*  BUN 8  --  7  CREATININE 0.74  --  0.82  CALCIUM 8.9  --  8.9  MG  --  1.6*  --    Liver Function Tests:  Recent Labs Lab 09/07/15 1150  AST 28  ALT 21  ALKPHOS 130*  BILITOT 1.2  PROT 5.8*  ALBUMIN 3.3*   No results for input(s): LIPASE, AMYLASE in the last 168 hours. No results for input(s): AMMONIA in the last 168 hours. CBC:  Recent Labs Lab 09/07/15 1150 09/08/15 0500  WBC 7.1 5.0  NEUTROABS 6.3  --   HGB 10.7* 10.2*  HCT 32.2* 31.2*  MCV 95.0 95.4  PLT 201 189   Cardiac Enzymes:  Recent Labs Lab 09/07/15 1536 09/07/15 2141 09/08/15 0500  CKTOTAL 234  --   --   TROPONINI  --  0.06* 0.09*   BNP: BNP (last 3 results) No  results for input(s): BNP in the last 8760 hours.  ProBNP (last 3 results) No results for input(s): PROBNP in the last 8760 hours.  CBG:  Recent Labs Lab 09/08/15 0722 09/09/15 0715 09/10/15 0747 09/11/15 0718 09/12/15 0739  GLUCAP 98 104* 83 88 87       Signed:  Haydin Calandra  [facility] 09/12/2015, 10:19 AM

## 2015-09-12 NOTE — Progress Notes (Signed)
Patient ID: Kristopher [name], male   DOB: 1932-01-06, 81 y.o.   MRN: [id number]    Subjective: Denies pain  Objective: Vital signs in last 24 hours: Temp:  [98.6 F (37 C)-99.5 F (37.5 C)] 99.5 F (37.5 C) (12/19 0500) Pulse Rate:  [85-88] 85 (12/19 0500) Resp:  [18-21] 21 (12/19 0500) BP: (94-135)/(44-99) 135/68 mmHg (12/19 0500) SpO2:  [96 %-100 %] 96 % (12/19 0850) Weight:  [51.302 kg (113 lb 1.6 oz)] 51.302 kg (113 lb 1.6 oz) (12/19 0500) Last BM Date: 09/10/15  Intake/Output from previous day: 12/18 0701 - 12/19 0700 In: 520 [P.O.:520] Out: 525 [Urine:525] Intake/Output this shift: Total I/O In: 240 [P.O.:240] Out: -   General appearance: cooperative Resp: clear to auscultation bilaterally and evolving ecchymoses and R clavicle deformity Chest wall: not tender on R, see above Cardio: regular rate and rhythm GI: soft, NT, ND  Assessment/Plan: Unwitnessed fall  Displaced right clavicle fx - non-op per ortho Right pneumothorax - improved, on RA Ribs 2-4 fx - pain control, pulm toilet Left inferior ramus fx - non-op per ortho Left comminuted greater trochanter fx - non-op per ortho TBI/Right frontal laceration Disp - OK from trauma standpoint to go to SNF today. I spoke with his daughter and I also spoke with Dr. Gonzella Lexhungel.   LOS: 5 days    Violeta GelinasBurke Valita Righter, MD, MPH, FACS Trauma: [phone] General Surgery: [phone]  09/12/2015

## 2015-09-12 NOTE — Clinical Social Work Placement (Signed)
   CLINICAL SOCIAL WORK PLACEMENT  NOTE  Date:  09/12/2015  Patient Details  Name: Kristopher [name] MRN: [id number] Date of Birth: 27-Apr-1932  Clinical Social Work is seeking post-discharge placement for this patient at the Skilled  Nursing Facility level of care (*CSW will initial, date and re-position this form in  chart as items are completed):  Yes   Patient/family provided with [facility] Clinical Social Work Department's list of facilities offering this level of care within the geographic area requested by the patient (or if unable, by the patient's family).  Yes   Patient/family informed of their freedom to choose among providers that offer the needed level of care, that participate in Medicare, Medicaid or managed care program needed by the patient, have an available bed and are willing to accept the patient.  Yes   Patient/family informed of [facility]'s ownership interest in [facility] and [facility], as well as of the fact that they are under no obligation to receive care at these facilities.  PASRR submitted to EDS on       PASRR number received on       Existing PASRR number confirmed on       FL2 transmitted to all facilities in geographic area requested by pt/family on 09/09/15     FL2 transmitted to all facilities within larger geographic area on       Patient informed that his/her managed care company has contracts with or will negotiate with certain facilities, including the following:        Yes   Patient/family informed of bed offers received.  [facility]     Physician recommends and patient chooses bed at      [facility].  Patient to be transferred to facility by Ambulance     Patient family notified on 09/12/15 of transfer.  Name of family member notified:  [name]     PHYSICIAN Please prepare priority discharge summary, including medications, Please prepare prescriptions, Please  sign FL2, Please sign DNR     Additional Comment:   Per MD [facility]. RN, patient, patient's family, and facility notified of DC. RN given number for report. DC packet on chart. Ambulance transport requested for patient for next available pickup. CSW signing off.  _______________________________________________ Roddie McBryant Britiny Defrain MSW, LCSW, [place], [phone]

## 2015-09-12 NOTE — Plan of Care (Signed)
Problem: Safety: Goal: Ability to remain free from injury will improve Outcome: Completed/Met Date Met:  09/12/15 Pt is remaining safe from falls during admission   Problem: Pain Managment: Goal: General experience of comfort will improve Outcome: Completed/Met Date Met:  09/12/15 Pt understands to ask for pain medication when he is feeling pain

## 2015-09-12 NOTE — Care Management Important Message (Signed)
Important Message  Patient Details  Name: Kristopher [name] MRN: [id number] Date of Birth: Oct 04, 1931   Medicare Important Message Given:  Yes    Kyla BalzarineShealy, Lorenia Hoston Abena 09/12/2015, 12:32 PM

## 2015-09-12 NOTE — Clinical Social Work Note (Signed)
PASRR number confirmed, [id number] A.   Roddie McBryant Bowe Sidor MSW, [place]CS[place], [place], [phone]

## 2015-09-12 NOTE — Progress Notes (Signed)
[facility], all questions answered.

## 2015-09-12 NOTE — Care Management Note (Signed)
Case Management Note  Patient Details  Name: Kristopher [name] MRN: [id number] Date of Birth: 12-04-1931  Subjective/Objective: Pt admitted for syncope and collapse- multiple rib fractures and Pneumothorax.                    Action/Plan: Plan for d/c today to SNF. CSW assisting with disposition needs.    Expected Discharge Date:                  Expected Discharge Plan:  Skilled Nursing Facility  In-House Referral:  Clinical Social Work  Discharge planning Services  CM Consult  Post Acute Care Choice:  NA Choice offered to:  NA  DME Arranged:  N/A DME Agency:  NA  HH Arranged:  NA HH Agency:  NA  Status of Service:  Completed, signed off  Medicare Important Message Given:    Date Medicare IM Given:    Medicare IM give by:    Date Additional Medicare IM Given:    Additional Medicare Important Message give by:     If discussed at Long Length of Stay Meetings, dates discussed:    Additional Comments:  Gala LewandowskyGraves-Bigelow, Clarabelle Oscarson Kaye, RN 09/12/2015, 10:49 AM

## 2015-10-04 ENCOUNTER — Encounter (HOSPITAL_COMMUNITY): Payer: Self-pay | Admitting: Emergency Medicine

## 2015-10-04 ENCOUNTER — Emergency Department (HOSPITAL_COMMUNITY): Payer: Medicare Other

## 2015-10-04 ENCOUNTER — Inpatient Hospital Stay (HOSPITAL_COMMUNITY)
Admission: EM | Admit: 2015-10-04 | Discharge: 2015-10-10 | DRG: 480 | Disposition: A | Discharge status: Skilled Nursing Facility | Payer: Medicare Other | Attending: Internal Medicine | Admitting: Internal Medicine

## 2015-10-04 DIAGNOSIS — Z8673 Personal history of transient ischemic attack (TIA), and cerebral infarction without residual deficits: Secondary | ICD-10-CM | POA: Diagnosis not present

## 2015-10-04 DIAGNOSIS — Z9181 History of falling: Secondary | ICD-10-CM | POA: Diagnosis not present

## 2015-10-04 DIAGNOSIS — Z881 Allergy status to other antibiotic agents status: Secondary | ICD-10-CM | POA: Diagnosis not present

## 2015-10-04 DIAGNOSIS — F32A Depression, unspecified: Secondary | ICD-10-CM | POA: Diagnosis present

## 2015-10-04 DIAGNOSIS — I2510 Atherosclerotic heart disease of native coronary artery without angina pectoris: Secondary | ICD-10-CM | POA: Diagnosis present

## 2015-10-04 DIAGNOSIS — F0280 Dementia in other diseases classified elsewhere without behavioral disturbance: Secondary | ICD-10-CM | POA: Diagnosis present

## 2015-10-04 DIAGNOSIS — Z515 Encounter for palliative care: Secondary | ICD-10-CM | POA: Diagnosis not present

## 2015-10-04 DIAGNOSIS — Z951 Presence of aortocoronary bypass graft: Secondary | ICD-10-CM | POA: Diagnosis not present

## 2015-10-04 DIAGNOSIS — I959 Hypotension, unspecified: Secondary | ICD-10-CM | POA: Diagnosis not present

## 2015-10-04 DIAGNOSIS — E43 Unspecified severe protein-calorie malnutrition: Secondary | ICD-10-CM | POA: Diagnosis present

## 2015-10-04 DIAGNOSIS — Z888 Allergy status to other drugs, medicaments and biological substances status: Secondary | ICD-10-CM | POA: Diagnosis not present

## 2015-10-04 DIAGNOSIS — Z66 Do not resuscitate: Secondary | ICD-10-CM | POA: Diagnosis present

## 2015-10-04 DIAGNOSIS — D62 Acute posthemorrhagic anemia: Secondary | ICD-10-CM | POA: Diagnosis not present

## 2015-10-04 DIAGNOSIS — N179 Acute kidney failure, unspecified: Secondary | ICD-10-CM | POA: Diagnosis present

## 2015-10-04 DIAGNOSIS — K219 Gastro-esophageal reflux disease without esophagitis: Secondary | ICD-10-CM | POA: Diagnosis present

## 2015-10-04 DIAGNOSIS — R54 Age-related physical debility: Secondary | ICD-10-CM | POA: Diagnosis present

## 2015-10-04 DIAGNOSIS — J939 Pneumothorax, unspecified: Secondary | ICD-10-CM | POA: Diagnosis present

## 2015-10-04 DIAGNOSIS — E870 Hyperosmolality and hypernatremia: Secondary | ICD-10-CM | POA: Diagnosis not present

## 2015-10-04 DIAGNOSIS — R0602 Shortness of breath: Secondary | ICD-10-CM

## 2015-10-04 DIAGNOSIS — I6203 Nontraumatic chronic subdural hemorrhage: Secondary | ICD-10-CM | POA: Diagnosis present

## 2015-10-04 DIAGNOSIS — S065XAA Traumatic subdural hemorrhage with loss of consciousness status unknown, initial encounter: Secondary | ICD-10-CM | POA: Diagnosis present

## 2015-10-04 DIAGNOSIS — I6200 Nontraumatic subdural hemorrhage, unspecified: Secondary | ICD-10-CM | POA: Diagnosis present

## 2015-10-04 DIAGNOSIS — Z885 Allergy status to narcotic agent status: Secondary | ICD-10-CM | POA: Diagnosis not present

## 2015-10-04 DIAGNOSIS — I10 Essential (primary) hypertension: Secondary | ICD-10-CM | POA: Diagnosis present

## 2015-10-04 DIAGNOSIS — K567 Ileus, unspecified: Secondary | ICD-10-CM | POA: Diagnosis not present

## 2015-10-04 DIAGNOSIS — S065X9A Traumatic subdural hemorrhage with loss of consciousness of unspecified duration, initial encounter: Secondary | ICD-10-CM

## 2015-10-04 DIAGNOSIS — R1310 Dysphagia, unspecified: Secondary | ICD-10-CM | POA: Diagnosis not present

## 2015-10-04 DIAGNOSIS — S72141A Displaced intertrochanteric fracture of right femur, initial encounter for closed fracture: Secondary | ICD-10-CM | POA: Diagnosis present

## 2015-10-04 DIAGNOSIS — W19XXXA Unspecified fall, initial encounter: Secondary | ICD-10-CM | POA: Diagnosis present

## 2015-10-04 DIAGNOSIS — R627 Adult failure to thrive: Secondary | ICD-10-CM | POA: Diagnosis present

## 2015-10-04 DIAGNOSIS — M109 Gout, unspecified: Secondary | ICD-10-CM | POA: Diagnosis present

## 2015-10-04 DIAGNOSIS — Z79899 Other long term (current) drug therapy: Secondary | ICD-10-CM

## 2015-10-04 DIAGNOSIS — S72009A Fracture of unspecified part of neck of unspecified femur, initial encounter for closed fracture: Secondary | ICD-10-CM | POA: Diagnosis present

## 2015-10-04 DIAGNOSIS — Y92129 Unspecified place in nursing home as the place of occurrence of the external cause: Secondary | ICD-10-CM | POA: Diagnosis not present

## 2015-10-04 DIAGNOSIS — N400 Enlarged prostate without lower urinary tract symptoms: Secondary | ICD-10-CM | POA: Diagnosis present

## 2015-10-04 DIAGNOSIS — K913 Postprocedural intestinal obstruction: Secondary | ICD-10-CM | POA: Diagnosis not present

## 2015-10-04 DIAGNOSIS — D6959 Other secondary thrombocytopenia: Secondary | ICD-10-CM | POA: Diagnosis present

## 2015-10-04 DIAGNOSIS — Z8719 Personal history of other diseases of the digestive system: Secondary | ICD-10-CM

## 2015-10-04 DIAGNOSIS — Z419 Encounter for procedure for purposes other than remedying health state, unspecified: Secondary | ICD-10-CM

## 2015-10-04 DIAGNOSIS — F329 Major depressive disorder, single episode, unspecified: Secondary | ICD-10-CM | POA: Diagnosis present

## 2015-10-04 DIAGNOSIS — G9340 Encephalopathy, unspecified: Secondary | ICD-10-CM | POA: Diagnosis not present

## 2015-10-04 DIAGNOSIS — S72001D Fracture of unspecified part of neck of right femur, subsequent encounter for closed fracture with routine healing: Secondary | ICD-10-CM | POA: Diagnosis not present

## 2015-10-04 DIAGNOSIS — I480 Paroxysmal atrial fibrillation: Secondary | ICD-10-CM | POA: Diagnosis present

## 2015-10-04 DIAGNOSIS — S72001A Fracture of unspecified part of neck of right femur, initial encounter for closed fracture: Secondary | ICD-10-CM | POA: Diagnosis not present

## 2015-10-04 DIAGNOSIS — S42013G Anterior displaced fracture of sternal end of unspecified clavicle, subsequent encounter for fracture with delayed healing: Secondary | ICD-10-CM

## 2015-10-04 DIAGNOSIS — Z8774 Personal history of (corrected) congenital malformations of heart and circulatory system: Secondary | ICD-10-CM

## 2015-10-04 HISTORY — DX: Unspecified fall, initial encounter: W19.XXXA

## 2015-10-04 LAB — CBC WITH DIFFERENTIAL/PLATELET
BASOS ABS: 0 10*3/uL (ref 0.0–0.1)
BASOS PCT: 0 %
EOS ABS: 0 10*3/uL (ref 0.0–0.7)
Eosinophils Relative: 0 %
HCT: 36.8 % — ABNORMAL LOW (ref 39.0–52.0)
Hemoglobin: 12.2 g/dL — ABNORMAL LOW (ref 13.0–17.0)
LYMPHS PCT: 9 %
Lymphs Abs: 0.9 10*3/uL (ref 0.7–4.0)
MCH: 32.4 pg (ref 26.0–34.0)
MCHC: 33.2 g/dL (ref 30.0–36.0)
MCV: 97.9 fL (ref 78.0–100.0)
Monocytes Absolute: 0.4 10*3/uL (ref 0.1–1.0)
Monocytes Relative: 4 %
Neutro Abs: 9 10*3/uL — ABNORMAL HIGH (ref 1.7–7.7)
Neutrophils Relative %: 87 %
PLATELETS: 274 10*3/uL (ref 150–400)
RBC: 3.76 MIL/uL — AB (ref 4.22–5.81)
RDW: 13.8 % (ref 11.5–15.5)
WBC: 10.4 10*3/uL (ref 4.0–10.5)

## 2015-10-04 LAB — TYPE AND SCREEN
ABO/RH(D): O POS
ANTIBODY SCREEN: NEGATIVE

## 2015-10-04 LAB — BASIC METABOLIC PANEL
Anion gap: 13 (ref 5–15)
BUN: 20 mg/dL (ref 6–20)
CO2: 22 mmol/L (ref 22–32)
Calcium: 9.1 mg/dL (ref 8.9–10.3)
Chloride: 103 mmol/L (ref 101–111)
Creatinine, Ser: 0.97 mg/dL (ref 0.61–1.24)
GFR calc Af Amer: >60 mL/min (ref >60)
Glucose, Bld: 123 mg/dL — ABNORMAL HIGH (ref 65–99)
POTASSIUM: 3.9 mmol/L (ref 3.5–5.1)
SODIUM: 138 mmol/L (ref 135–145)

## 2015-10-04 LAB — APTT: APTT: 32 s (ref 24–37)

## 2015-10-04 LAB — ABO/RH: ABO/RH(D): O POS

## 2015-10-04 LAB — PROTIME-INR
INR: 1.1 (ref 0.00–1.49)
PROTHROMBIN TIME: 14.4 s (ref 11.6–15.2)

## 2015-10-04 MED ORDER — TAMSULOSIN HCL 0.4 MG PO CAPS
0.4000 mg | ORAL_CAPSULE | Freq: Every day | ORAL | Status: DC
  Administered 2015-10-05 – 2015-10-09 (×5): 0.4 mg via ORAL
  Filled 2015-10-04 (×5): qty 1

## 2015-10-04 MED ORDER — MONTELUKAST SODIUM 10 MG PO TABS
10.0000 mg | ORAL_TABLET | Freq: Every day | ORAL | Status: DC
  Administered 2015-10-05 – 2015-10-09 (×5): 10 mg via ORAL
  Filled 2015-10-04 (×5): qty 1

## 2015-10-04 MED ORDER — GUAIFENESIN ER 600 MG PO TB12: 600.0000 mg | ORAL_TABLET | Freq: Two times a day (BID) | ORAL | Status: DC | PRN

## 2015-10-04 MED ORDER — DONEPEZIL HCL 5 MG PO TABS
5.0000 mg | ORAL_TABLET | Freq: Every day | ORAL | Status: DC
  Administered 2015-10-06: 5 mg via ORAL
  Filled 2015-10-04 (×3): qty 1

## 2015-10-04 MED ORDER — DILTIAZEM HCL ER COATED BEADS 120 MG PO CP24
120.0000 mg | ORAL_CAPSULE | Freq: Every day | ORAL | Status: DC
  Administered 2015-10-05 – 2015-10-09 (×5): 120 mg via ORAL
  Filled 2015-10-04 (×5): qty 1

## 2015-10-04 MED ORDER — MORPHINE SULFATE (PF) 2 MG/ML IV SOLN
2.0000 mg | INTRAVENOUS | Status: DC | PRN
  Administered 2015-10-05: 2 mg via INTRAVENOUS
  Filled 2015-10-04: qty 1

## 2015-10-04 MED ORDER — SODIUM CHLORIDE 0.9 % IV SOLN
1000.0000 mL | INTRAVENOUS | Status: DC
  Administered 2015-10-05: 1000 mL via INTRAVENOUS

## 2015-10-04 MED ORDER — ACETAMINOPHEN 325 MG PO TABS: 650.0000 mg | ORAL_TABLET | Freq: Four times a day (QID) | ORAL | Status: DC | PRN

## 2015-10-04 MED ORDER — SODIUM CHLORIDE 0.9 % IJ SOLN
3.0000 mL | Freq: Two times a day (BID) | INTRAMUSCULAR | Status: DC
  Administered 2015-10-06 – 2015-10-09 (×8): 3 mL via INTRAVENOUS

## 2015-10-04 MED ORDER — SODIUM CHLORIDE 0.9 % IV SOLN
1000.0000 mL | INTRAVENOUS | Status: DC
  Administered 2015-10-04: 1000 mL via INTRAVENOUS

## 2015-10-04 MED ORDER — ALLOPURINOL 300 MG PO TABS
300.0000 mg | ORAL_TABLET | Freq: Every day | ORAL | Status: DC
  Administered 2015-10-05 – 2015-10-09 (×5): 300 mg via ORAL
  Filled 2015-10-04 (×5): qty 1

## 2015-10-04 MED ORDER — SERTRALINE HCL 50 MG PO TABS
50.0000 mg | ORAL_TABLET | Freq: Every day | ORAL | Status: DC
  Administered 2015-10-05 – 2015-10-07 (×3): 50 mg via ORAL
  Filled 2015-10-04 (×3): qty 1

## 2015-10-04 MED ORDER — FINASTERIDE 5 MG PO TABS
5.0000 mg | ORAL_TABLET | Freq: Every day | ORAL | Status: DC
  Administered 2015-10-05 – 2015-10-09 (×5): 5 mg via ORAL
  Filled 2015-10-04 (×5): qty 1

## 2015-10-04 MED ORDER — ALBUTEROL SULFATE (2.5 MG/3ML) 0.083% IN NEBU: 2.5000 mg | INHALATION_SOLUTION | Freq: Four times a day (QID) | RESPIRATORY_TRACT | Status: DC | PRN

## 2015-10-04 MED ORDER — ACETAMINOPHEN 650 MG RE SUPP: 650.0000 mg | Freq: Four times a day (QID) | RECTAL | Status: DC | PRN

## 2015-10-04 MED ORDER — PANTOPRAZOLE SODIUM 40 MG PO TBEC
40.0000 mg | DELAYED_RELEASE_TABLET | Freq: Two times a day (BID) | ORAL | Status: DC
  Administered 2015-10-05 – 2015-10-09 (×7): 40 mg via ORAL
  Filled 2015-10-04 (×7): qty 1

## 2015-10-04 MED ORDER — ENSURE ENLIVE PO LIQD
237.0000 mL | Freq: Three times a day (TID) | ORAL | Status: DC
  Administered 2015-10-05 – 2015-10-09 (×9): 237 mL via ORAL
  Filled 2015-10-04 (×2): qty 237

## 2015-10-04 MED ORDER — HYDRALAZINE HCL 20 MG/ML IJ SOLN: 5.0000 mg | INTRAMUSCULAR | Status: DC | PRN

## 2015-10-04 MED ORDER — HYDROMORPHONE HCL 1 MG/ML IJ SOLN
0.5000 mg | INTRAMUSCULAR | Status: DC | PRN
  Administered 2015-10-04: 0.5 mg via INTRAVENOUS
  Filled 2015-10-04: qty 1

## 2015-10-04 MED ORDER — OXYCODONE-ACETAMINOPHEN 5-325 MG PO TABS: 1.0000 | ORAL_TABLET | ORAL | Status: DC | PRN

## 2015-10-04 NOTE — Progress Notes (Signed)
I have reviewed the patient's history in the EMR as well as his presenting history with the ED attending physician. Per report, he is at neurologic baseline. CT scan was reviewed and compared with prior CT of 09/07/15. This demonstrates interval development of bilateral subacute/chronic SDH with miniscule areas of superimposed acute hemorrhage. There is no local mass effect, midline shift, or hydrocephalus. He does also have an acute right hip fracture. There is certainly no absolute contraindication to surgical treatment of his hip fracture because of the subdural hematomas. I can see him in my office in about 2 weeks for f/u of the [facility] [phone]).

## 2015-10-04 NOTE — H&P (Signed)
[facility] History and Physical  Avaneesh Pepitone HQI:[id number] DOB: May 15, 1932 DOA: 10/04/2015  Referring physician: ED physician PCP: Ginette Otto, MD  Specialists:   Chief Complaint: Right hip pain after fall  HPI: Kristopher [name] is a 82 y.o. male with PMH of dementia, frequent fall, multiple bone fracture in the past, CAD, S/P CABG, atrial fibrillation not on anticoagulants, BPH, stroke, GI bleeding, pneumothorax, GERD, gout, depression, hypertension, who presents with right hip pain after fall.  Per patient's daughter, pt was in rehab for broken L hip. He slipped from a recliner, landing on R hip at about 5:30 PM. No loc or head trauma.  Patient developed severe pain over right hip. No numbness or tingling sensations in his right leg. Patient was recently treated with antibiotics for bronchitis per his daughter. Currently no cough, chest pain, shortness of breath, fever or chills. Patient does not have abdominal pain, diarrhea, symptoms of UTI or unilateral weakness.  In ED, patient was found to have WBC 10.4, temperature normal, no tachycardia, electrolytes and renal function okay, negative chest x-ray for acute abnormalities. CT of C-spine and CT of the head showed intervalsubdural hemorrhages without significant mass effect, no evidence of acute cervical spine fracture, poor healing of displaced medial right clavicular fracture since prior CT, resolution of previously visualized right apical pneumothorax. X ray of right hip/pelvis showed acute fracture of the right hip which appears to be predominantly subtrochanteric in nature. Intertrochanteric component also suspected. Patient is admitted to inpatient for further evaluation and treatment. Orthopedic surgeon and neurosurgeon were consulted by EDP  EKG: Not done in ED, will get one.   Where does patient live?  SNF   Can patient participate in ADLs?  None   Review of Systems:  Could not be reviewed the due to  dementia  Allergy:  Allergies  Allergen Reactions  . Codeine     unknown  . Other Other (See Comments)    All pain medications cause a hangover effect  . Sulfa Antibiotics     unknown    Past Medical History  Diagnosis Date  . Atrial fibrillation (HCC)   . Hx of CABG   . Hypertension   . DJD (degenerative joint disease), cervical   . Colon polyps   . Gout   . BPH (benign prostatic hyperplasia)   . Stroke [facility])     "light stroke" per daughter  . Fall     Past Surgical History  Procedure Laterality Date  . Coronary artery bypass graft    . Appendectomy    . Tonsillectomy    . Colonoscopy    . Upper gastrointestinal endoscopy    . Esophagogastroduodenoscopy N/A 12/20/2013    Procedure: ESOPHAGOGASTRODUODENOSCOPY (EGD);  Surgeon: Iva Boop, MD;  Location: [facility] ENDOSCOPY;  Service: Endoscopy;  Laterality: N/A;  might be bedside  . Colonoscopy N/A 12/22/2013    Procedure: COLONOSCOPY;  Surgeon: Rachael Fee, MD;  Location: [facility] ENDOSCOPY;  Service: Endoscopy;  Laterality: N/A;  . Givens capsule study N/A 12/22/2013    Procedure: GIVENS CAPSULE STUDY;  Surgeon: Rachael Fee, MD;  Location: [facility] ENDOSCOPY;  Service: Endoscopy;  Laterality: N/A;  . Eye muscle surgery      Social History:  reports that he has never smoked. He has never used smokeless tobacco. He reports that he does not drink alcohol or use illicit drugs.  Family History: could not be reviewed the due to dementia.  Prior to Admission medications   Medication Sig Start Date  End Date Taking? Authorizing Provider  acetaminophen (TYLENOL ARTHRITIS PAIN) 650 MG CR tablet Take 1,300 mg by mouth 2 (two) times daily.    Yes Historical Provider, MD  albuterol (PROVENTIL HFA;VENTOLIN HFA) 108 (90 Base) MCG/ACT inhaler Inhale 1 puff into the lungs every 4 (four) hours as needed for wheezing or shortness of breath.   Yes Historical Provider, MD  albuterol (PROVENTIL) (2.5 MG/3ML) 0.083% nebulizer solution Take 2.5 mg  by nebulization every 6 (six) hours as needed for wheezing or shortness of breath.   Yes Historical Provider, MD  allopurinol (ZYLOPRIM) 300 MG tablet Take 300 mg by mouth daily.    Yes Historical Provider, MD  diltiazem (CARDIZEM CD) 120 MG 24 hr capsule Take 1 capsule (120 mg total) by mouth daily. 09/12/15  Yes Nishant Dhungel, MD  donepezil (ARICEPT) 5 MG tablet Take 5 mg by mouth at bedtime.   Yes Historical Provider, MD  feeding supplement, ENSURE ENLIVE, (ENSURE ENLIVE) LIQD Take 237 mLs by mouth 3 (three) times daily between meals. 09/12/15  Yes Nishant Dhungel, MD  finasteride (PROSCAR) 5 MG tablet Take 5 mg by mouth daily.  10/15/13  Yes Historical Provider, MD  guaiFENesin (MUCINEX) 600 MG 12 hr tablet Take 600 mg by mouth 2 (two) times daily. Start Date 09/28/15 & End Date 10/04/15.   Yes Historical Provider, MD  montelukast (SINGULAIR) 10 MG tablet Take 10 mg by mouth daily.  09/29/13  Yes Historical Provider, MD  pantoprazole (PROTONIX) 40 MG tablet Take 1 tablet (40 mg total) by mouth 2 (two) times daily. 12/16/14  Yes Drema Dallas, MD  sertraline (ZOLOFT) 50 MG tablet Take 50 mg by mouth daily.   Yes Historical Provider, MD  tamsulosin (FLOMAX) 0.4 MG CAPS capsule Take 0.4 mg by mouth daily.    Yes Historical Provider, MD  traMADol-acetaminophen (ULTRACET) 37.5-325 MG tablet Take 1 tablet by mouth every 6 (six) hours as needed for moderate pain. 09/12/15  Yes Nishant Dhungel, MD  levalbuterol (XOPENEX) 0.63 MG/3ML nebulizer solution Take 3 mLs (0.63 mg total) by nebulization every 4 (four) hours as needed for wheezing or shortness of breath. Patient not taking: Reported on 10/04/2015 09/12/15   Eddie North, MD    Physical Exam: Filed Vitals:   10/04/15 1828 10/04/15 2100 10/04/15 2230  BP: 162/95 135/74 145/83  Pulse: 72 84 83  Temp: 97 F (36.1 C)    TempSrc: Axillary    Resp:   15  SpO2: 100% 100% 100%   General: Not in acute distress HEENT:       Eyes: PERRL, EOMI, no  scleral icterus.       ENT: No discharge from the ears and nose, no pharynx injection, no tonsillar enlargement.        Neck: No JVD, no bruit, no mass felt. Heme: No neck lymph node enlargement. Cardiac: S1/S2, RRR, 2/6 systolic murmurs, No gallops or rubs. Pulm:  No rales, wheezing, rhonchi or rubs. Abd: Soft, nondistended, nontender, no rebound pain, no organomegaly, BS present. Ext: No pitting leg edema bilaterally. 2+DP/PT pulse bilaterally. Musculoskeletal: tenderness over right hip. Right leg is exernally rotated and shortened Skin: No rashes.  Neuro: Alert, oriented X3, cranial nerves II-XII grossly intact, muscle strength 4/5 in all extremities, sensation to light touch intact.  Psych: Patient is not psychotic, no suicidal or hemocidal ideation.  Labs on Admission:  Basic Metabolic Panel:  Recent Labs Lab 10/04/15 2037  NA 138  K 3.9  CL 103  CO2 22  GLUCOSE 123*  BUN 20  CREATININE 0.97  CALCIUM 9.1   Liver Function Tests: No results for input(s): AST, ALT, ALKPHOS, BILITOT, PROT, ALBUMIN in the last 168 hours. No results for input(s): LIPASE, AMYLASE in the last 168 hours. No results for input(s): AMMONIA in the last 168 hours. CBC:  Recent Labs Lab 10/04/15 2037  WBC 10.4  NEUTROABS 9.0*  HGB 12.2*  HCT 36.8*  MCV 97.9  PLT 274   Cardiac Enzymes: No results for input(s): CKTOTAL, CKMB, CKMBINDEX, TROPONINI in the last 168 hours.  BNP (last 3 results) No results for input(s): BNP in the last 8760 hours.  ProBNP (last 3 results) No results for input(s): PROBNP in the last 8760 hours.  CBG: No results for input(s): GLUCAP in the last 168 hours.  Radiological Exams on Admission: Dg Chest 1 View  10/04/2015  CLINICAL DATA:  Fall.  Right hip fracture. EXAM: CHEST 1 VIEW COMPARISON:  09/10/2015 FINDINGS: Previous median sternotomy and CABG procedure. Aortic atherosclerosis noted. The heart size and mediastinal contours are within normal limits. Both  lungs are clear. The visualized skeletal structures are unremarkable. IMPRESSION: No active disease. Electronically Signed   By: Signa Kellaylor  Stroud M.D.   On: 10/04/2015 19:09   Ct Head Wo Contrast  10/04/2015  CLINICAL DATA:  Fall at nursing home with acute right hip fracture. Initial encounter. EXAM: CT HEAD WITHOUT CONTRAST CT CERVICAL SPINE WITHOUT CONTRAST TECHNIQUE: Multidetector CT imaging of the head and cervical spine was performed following the standard protocol without intravenous contrast. Multiplanar CT image reconstructions of the cervical spine were also generated. COMPARISON:  09/07/2015 FINDINGS: CT HEAD FINDINGS Since the prior study, the patient has developed bilateral subdural fluid collections overlying the convexities. The left-sided collection may be slightly larger than the left and measures approximately 12 mm in thickness. There are several foci of increased density in both subdural collections consistent with more acute age blood. For the most part these subdural collections are lower density and likely consistent with older blood product. No significant mass effect is identified. Stable atrophy and small vessel disease present. No hydrocephalus or acute infarction. No skull fracture identified. CT CERVICAL SPINE FINDINGS The cervical spine shows normal alignment. There is no evidence of acute fracture or subluxation. No soft tissue swelling or hematoma is identified. Stable advanced spondylosis of the cervical spine with near complete loss of disc space height at C4-5, partially fused C5-6 level and moderate disc disease at C3-4 and C6-7. No bony or soft tissue lesions are seen. The visualized airway is normally patent. Previous displaced medial right clavicle fracture shows poor healing since the prior CT with some interval callus formation. Residual deformity is present. Previously noted right apical pneumothorax has resolved. IMPRESSION: 1. Development of bilateral subdural fluid  collections overlying the convexities, slightly larger on the left, consistent with interval subdural hemorrhages. There are several foci of higher density in these collections suggestive of more acute aged blood. Subdural collections are not causing significant mass effect. 2. No evidence of acute cervical spine fracture. Stable advanced cervical disc disease. 3. Poor healing of displaced medial right clavicular fracture since prior CT. 4. Resolution of previously visualized right apical pneumothorax. 5. These results were called by telephone at the time of interpretation on 10/04/2015 at 8:20 pm to Dr. Linwood DibblesJON KNAPP , who verbally acknowledged these results. Electronically Signed   By: Irish LackGlenn  Yamagata M.D.   On: 10/04/2015 20:24   Ct Cervical Spine Wo Contrast  10/04/2015  CLINICAL  DATA:  Fall at nursing home with acute right hip fracture. Initial encounter. EXAM: CT HEAD WITHOUT CONTRAST CT CERVICAL SPINE WITHOUT CONTRAST TECHNIQUE: Multidetector CT imaging of the head and cervical spine was performed following the standard protocol without intravenous contrast. Multiplanar CT image reconstructions of the cervical spine were also generated. COMPARISON:  09/07/2015 FINDINGS: CT HEAD FINDINGS Since the prior study, the patient has developed bilateral subdural fluid collections overlying the convexities. The left-sided collection may be slightly larger than the left and measures approximately 12 mm in thickness. There are several foci of increased density in both subdural collections consistent with more acute age blood. For the most part these subdural collections are lower density and likely consistent with older blood product. No significant mass effect is identified. Stable atrophy and small vessel disease present. No hydrocephalus or acute infarction. No skull fracture identified. CT CERVICAL SPINE FINDINGS The cervical spine shows normal alignment. There is no evidence of acute fracture or subluxation. No soft  tissue swelling or hematoma is identified. Stable advanced spondylosis of the cervical spine with near complete loss of disc space height at C4-5, partially fused C5-6 level and moderate disc disease at C3-4 and C6-7. No bony or soft tissue lesions are seen. The visualized airway is normally patent. Previous displaced medial right clavicle fracture shows poor healing since the prior CT with some interval callus formation. Residual deformity is present. Previously noted right apical pneumothorax has resolved. IMPRESSION: 1. Development of bilateral subdural fluid collections overlying the convexities, slightly larger on the left, consistent with interval subdural hemorrhages. There are several foci of higher density in these collections suggestive of more acute aged blood. Subdural collections are not causing significant mass effect. 2. No evidence of acute cervical spine fracture. Stable advanced cervical disc disease. 3. Poor healing of displaced medial right clavicular fracture since prior CT. 4. Resolution of previously visualized right apical pneumothorax. 5. These results were called by telephone at the time of interpretation on 10/04/2015 at 8:20 pm to Dr. Linwood Dibbles , who verbally acknowledged these results. Electronically Signed   By: Irish Lack M.D.   On: 10/04/2015 20:24   Dg Hip Unilat  With Pelvis 2-3 Views Right  10/04/2015  CLINICAL DATA:  Fall with right hip pain.  Initial encounter. EXAM: DG HIP (WITH OR WITHOUT PELVIS) 2-3V RIGHT COMPARISON:  None. FINDINGS: Acute and comminuted fracture of the right hip is present which appears to be primarily subtrochanteric. Intertrochanteric component also suspected with lucencies extending into the greater and lesser trochanters. The femoral neck appears intact. No evidence of dislocation. No pelvic fracture or diastasis identified. IMPRESSION: Acute fracture of the right hip which appears to be predominantly subtrochanteric in nature. Intertrochanteric  component also suspected. Electronically Signed   By: Irish Lack M.D.   On: 10/04/2015 19:15    Assessment/Plan Principal Problem:   Closed fracture of right hip (HCC) Active Problems:   History of coronary artery bypass graft   History of GI bleed   Paroxysmal atrial fibrillation (HCC)   Personal history of arterial venous malformation (AVM)   Hip fracture (HCC)   Fall   Essential hypertension   Anterior displaced fracture of proximal clavicle with delayed healing   Dementia due to general medical condition without behavioral disturbance   Protein-calorie malnutrition, severe (HCC)   Pneumothorax on right   SDH (subdural hematoma) (HCC)   Closed fracture of right hip (HCC): As evidenced by x-ray. Patient has moderate pain now. No neurovascular compromise. Orthopedic  surgeon was consulted. Dr. Madelon Lips asked to transfer patient to [facility] hospital, received morning.  - will admit to SDU - Pain control: morphine prn and percocet - follow up ortho recs - NPO after MN - type and cross - INR/PTT  SDH (subdural hematoma) [facility]): neurosurgery was consulted by ED. Per Dr. Conchita Paris, pt has bilateral subacute/chronic SDH with miniscule areas of superimposed acute hemorrhage, no need for surgery. Pt can be followed up in his office in about 2 weeks for f/u of the SDH [facility] [phone]). -Neuro check q2h -f/u with Dr. Conchita Paris  CAD: s/p of CABG: No CP. - tele monitor -no ASA due to SDH  PAF: CHA2DS2-VASc Score is 5, needs oral anticoagulation. Patient is not on AC at home due to hx of GIB, dementia and fall risk. Now pt has SDH. Heart rate is well controlled -tele monitoring -continue cardizem   Essential hypertension: -On Cardizem -IV hydralazine when necessary  Dementia: -donepezi  Protein-calorie malnutrition, severe (HCC): -Ensure  GERD: -Protonix  BPH: stable - Continue Flomax and proscar  Gout:  stable -Aloopurinol  Depression: Stable, no suicidal or homicidal ideations. -Continue home medications: Zoloft  DVT ppx: SCD  Code Status: DNR Family Communication: Yes, patient's daughter at bed side Disposition Plan: Admit to inpatient   Date of Service 10/05/2015    Lorretta Harp [facility] Pager [phone]  If 7PM-7AM, please contact night-coverage [email or web address] Password [email or web address] 10/05/2015, 1:07 AM

## 2015-10-04 NOTE — ED Notes (Addendum)
C-COLLAR REMOVED BY DR. Lynelle DoctorKNAPP.

## 2015-10-04 NOTE — ED Provider Notes (Signed)
CSN: [id number]     Arrival date & time 10/04/15  1816 History   First MD Initiated Contact with Patient 10/04/15 1845     Chief Complaint  Patient presents with  . Fall   HPI Patient presents to the emergency room for evaluation after a hip injury. Patient has history of dementia so the history is limited. She does not recall what happened. The patient's daughter states that she was told by the nursing home staff his roommate watched him slide out of the bed. He immediately had pain in his right hip and was unable to get up. EMS was called. They noted deformity of the right hip. Patient denies any trouble with any headache or neck pain. No chest pain or abdominal pain. Past Medical History  Diagnosis Date  . Atrial fibrillation (HCC)   . Hx of CABG   . Hypertension   . DJD (degenerative joint disease), cervical   . Colon polyps   . Gout   . BPH (benign prostatic hyperplasia)   . Stroke [facility](HCC)     "light stroke" per daughter   Past Surgical History  Procedure Laterality Date  . Coronary artery bypass graft    . Appendectomy    . Tonsillectomy    . Colonoscopy    . Upper gastrointestinal endoscopy    . Esophagogastroduodenoscopy N/A 12/20/2013    Procedure: ESOPHAGOGASTRODUODENOSCOPY (EGD);  Surgeon: Iva Booparl E Gessner, MD;  [facility];  Service: Endoscopy;  Laterality: N/A;  might be bedside  . Colonoscopy N/A 12/22/2013    Procedure: COLONOSCOPY;  Surgeon: Rachael Feeaniel P Jacobs, MD;  [facility];  Service: Endoscopy;  Laterality: N/A;  . Givens capsule study N/A 12/22/2013    Procedure: GIVENS CAPSULE STUDY;  Surgeon: Rachael Feeaniel P Jacobs, MD;  [facility];  Service: Endoscopy;  Laterality: N/A;  . Eye muscle surgery     History reviewed. No pertinent family history. Social History  Substance Use Topics  . Smoking status: Never Smoker   . Smokeless tobacco: Never Used  . Alcohol Use: No    Review of Systems  All other systems reviewed and are  negative.     Allergies  Codeine; Other; and Sulfa antibiotics  Home Medications   Prior to Admission medications   Medication Sig Start Date End Date Taking? Authorizing Provider  acetaminophen (TYLENOL ARTHRITIS PAIN) 650 MG CR tablet Take 1,300 mg by mouth 2 (two) times daily.    Yes Historical Provider, MD  albuterol (PROVENTIL HFA;VENTOLIN HFA) 108 (90 Base) MCG/ACT inhaler Inhale 1 puff into the lungs every 4 (four) hours as needed for wheezing or shortness of breath.   Yes Historical Provider, MD  albuterol (PROVENTIL) (2.5 MG/3ML) 0.083% nebulizer solution Take 2.5 mg by nebulization every 6 (six) hours as needed for wheezing or shortness of breath.   Yes Historical Provider, MD  allopurinol (ZYLOPRIM) 300 MG tablet Take 300 mg by mouth daily.    Yes Historical Provider, MD  diltiazem (CARDIZEM CD) 120 MG 24 hr capsule Take 1 capsule (120 mg total) by mouth daily. 09/12/15  Yes Nishant Dhungel, MD  donepezil (ARICEPT) 5 MG tablet Take 5 mg by mouth at bedtime.   Yes Historical Provider, MD  feeding supplement, ENSURE ENLIVE, (ENSURE ENLIVE) LIQD Take 237 mLs by mouth 3 (three) times daily between meals. 09/12/15  Yes Nishant Dhungel, MD  finasteride (PROSCAR) 5 MG tablet Take 5 mg by mouth daily.  10/15/13  Yes Historical Provider, MD  guaiFENesin (MUCINEX) 600 MG  12 hr tablet Take 600 mg by mouth 2 (two) times daily. Start Date 09/28/15 & End Date 10/04/15.   Yes Historical Provider, MD  montelukast (SINGULAIR) 10 MG tablet Take 10 mg by mouth daily.  09/29/13  Yes Historical Provider, MD  pantoprazole (PROTONIX) 40 MG tablet Take 1 tablet (40 mg total) by mouth 2 (two) times daily. 12/16/14  Yes Drema Dallas, MD  sertraline (ZOLOFT) 50 MG tablet Take 50 mg by mouth daily.   Yes Historical Provider, MD  tamsulosin (FLOMAX) 0.4 MG CAPS capsule Take 0.4 mg by mouth daily.    Yes Historical Provider, MD  traMADol-acetaminophen (ULTRACET) 37.5-325 MG tablet Take 1 tablet by mouth every 6  (six) hours as needed for moderate pain. 09/12/15  Yes Nishant Dhungel, MD  levalbuterol (XOPENEX) 0.63 MG/3ML nebulizer solution Take 3 mLs (0.63 mg total) by nebulization every 4 (four) hours as needed for wheezing or shortness of breath. Patient not taking: Reported on 10/04/2015 09/12/15   Nishant Dhungel, MD   BP 162/95 mmHg  Pulse 72  Temp(Src) 97 F (36.1 C) (Axillary)  SpO2 100% Physical Exam  Constitutional: He appears distressed.  HENT:  Head: Normocephalic and atraumatic.  Right Ear: External ear normal.  Left Ear: External ear normal.  Eyes: Conjunctivae are normal. Right eye exhibits no discharge. Left eye exhibits no discharge. No scleral icterus.  Neck: Neck supple. No tracheal deviation present.  Cardiovascular: Normal rate, regular rhythm and intact distal pulses.   Pulmonary/Chest: Effort normal and breath sounds normal. No stridor. No respiratory distress. He has no wheezes. He has no rales.  Abdominal: Soft. Bowel sounds are normal. He exhibits no distension. There is no tenderness. There is no rebound and no guarding.  Musculoskeletal: He exhibits no edema.       Right shoulder: Normal.       Left shoulder: Normal.       Right elbow: He exhibits normal range of motion, no swelling and no deformity. Lacerations: small abrasion on the right elbow.       Right wrist: Normal.       Left wrist: Normal.       Right hip: He exhibits tenderness and bony tenderness.       Left hip: Normal.       Cervical back: Normal.       Thoracic back: Normal.       Lumbar back: Normal.  Neurological: He is alert. No cranial nerve deficit (no facial droop, extraocular movements intact, no slurred speech) or sensory deficit. He exhibits normal muscle tone. He displays no seizure activity. Coordination normal. GCS eye subscore is 4. GCS verbal subscore is 4. GCS motor subscore is 6.  4-5 strength in all extremities, patient is able to follow commands and move both hands and both legs   Skin: Skin is warm and dry. No rash noted.  Psychiatric: He has a normal mood and affect.  Nursing note and vitals reviewed.   ED Course  Procedures  CRITICAL CARE Performed by: ZOXWR,UEA Total critical care time: 35 minutes Critical care time was exclusive of separately billable procedures and treating other patients. Critical care was necessary to treat or prevent imminent or life-threatening deterioration. Critical care was time spent personally by me on the following activities: development of treatment plan with patient and/or surrogate as well as nursing, discussions with consultants, evaluation of patient's response to treatment, examination of patient, obtaining history from patient or surrogate, ordering and performing treatments and interventions, ordering  and review of laboratory studies, ordering and review of radiographic studies, pulse oximetry and re-evaluation of patient's condition.  Labs Review Labs Reviewed  BASIC METABOLIC PANEL - Abnormal; Notable for the following:    Glucose, Bld 123 (*)    All other components within normal limits  CBC WITH DIFFERENTIAL/PLATELET - Abnormal; Notable for the following:    RBC 3.76 (*)    Hemoglobin 12.2 (*)    HCT 36.8 (*)    Neutro Abs 9.0 (*)    All other components within normal limits  PROTIME-INR  TYPE AND SCREEN  ABO/RH    Imaging Review Dg Chest 1 View  10/04/2015  CLINICAL DATA:  Fall.  Right hip fracture. EXAM: CHEST 1 VIEW COMPARISON:  09/10/2015 FINDINGS: Previous median sternotomy and CABG procedure. Aortic atherosclerosis noted. The heart size and mediastinal contours are within normal limits. Both lungs are clear. The visualized skeletal structures are unremarkable. IMPRESSION: No active disease. Electronically Signed   By: Signa Kell M.D.   On: 10/04/2015 19:09   Ct Head Wo Contrast  10/04/2015  CLINICAL DATA:  Fall at nursing home with acute right hip fracture. Initial encounter. EXAM: CT HEAD WITHOUT  CONTRAST CT CERVICAL SPINE WITHOUT CONTRAST TECHNIQUE: Multidetector CT imaging of the head and cervical spine was performed following the standard protocol without intravenous contrast. Multiplanar CT image reconstructions of the cervical spine were also generated. COMPARISON:  09/07/2015 FINDINGS: CT HEAD FINDINGS Since the prior study, the patient has developed bilateral subdural fluid collections overlying the convexities. The left-sided collection may be slightly larger than the left and measures approximately 12 mm in thickness. There are several foci of increased density in both subdural collections consistent with more acute age blood. For the most part these subdural collections are lower density and likely consistent with older blood product. No significant mass effect is identified. Stable atrophy and small vessel disease present. No hydrocephalus or acute infarction. No skull fracture identified. CT CERVICAL SPINE FINDINGS The cervical spine shows normal alignment. There is no evidence of acute fracture or subluxation. No soft tissue swelling or hematoma is identified. Stable advanced spondylosis of the cervical spine with near complete loss of disc space height at C4-5, partially fused C5-6 level and moderate disc disease at C3-4 and C6-7. No bony or soft tissue lesions are seen. The visualized airway is normally patent. Previous displaced medial right clavicle fracture shows poor healing since the prior CT with some interval callus formation. Residual deformity is present. Previously noted right apical pneumothorax has resolved. IMPRESSION: 1. Development of bilateral subdural fluid collections overlying the convexities, slightly larger on the left, consistent with interval subdural hemorrhages. There are several foci of higher density in these collections suggestive of more acute aged blood. Subdural collections are not causing significant mass effect. 2. No evidence of acute cervical spine fracture.  Stable advanced cervical disc disease. 3. Poor healing of displaced medial right clavicular fracture since prior CT. 4. Resolution of previously visualized right apical pneumothorax. 5. These results were called by telephone at the time of interpretation on 10/04/2015 at 8:20 pm to Dr. Linwood Dibbles , who verbally acknowledged these results. Electronically Signed   By: Irish Lack M.D.   On: 10/04/2015 20:24   Ct Cervical Spine Wo Contrast  10/04/2015  CLINICAL DATA:  Fall at nursing home with acute right hip fracture. Initial encounter. EXAM: CT HEAD WITHOUT CONTRAST CT CERVICAL SPINE WITHOUT CONTRAST TECHNIQUE: Multidetector CT imaging of the head and cervical spine was performed  following the standard protocol without intravenous contrast. Multiplanar CT image reconstructions of the cervical spine were also generated. COMPARISON:  09/07/2015 FINDINGS: CT HEAD FINDINGS Since the prior study, the patient has developed bilateral subdural fluid collections overlying the convexities. The left-sided collection may be slightly larger than the left and measures approximately 12 mm in thickness. There are several foci of increased density in both subdural collections consistent with more acute age blood. For the most part these subdural collections are lower density and likely consistent with older blood product. No significant mass effect is identified. Stable atrophy and small vessel disease present. No hydrocephalus or acute infarction. No skull fracture identified. CT CERVICAL SPINE FINDINGS The cervical spine shows normal alignment. There is no evidence of acute fracture or subluxation. No soft tissue swelling or hematoma is identified. Stable advanced spondylosis of the cervical spine with near complete loss of disc space height at C4-5, partially fused C5-6 level and moderate disc disease at C3-4 and C6-7. No bony or soft tissue lesions are seen. The visualized airway is normally patent. Previous displaced medial  right clavicle fracture shows poor healing since the prior CT with some interval callus formation. Residual deformity is present. Previously noted right apical pneumothorax has resolved. IMPRESSION: 1. Development of bilateral subdural fluid collections overlying the convexities, slightly larger on the left, consistent with interval subdural hemorrhages. There are several foci of higher density in these collections suggestive of more acute aged blood. Subdural collections are not causing significant mass effect. 2. No evidence of acute cervical spine fracture. Stable advanced cervical disc disease. 3. Poor healing of displaced medial right clavicular fracture since prior CT. 4. Resolution of previously visualized right apical pneumothorax. 5. These results were called by telephone at the time of interpretation on 10/04/2015 at 8:20 pm to Dr. Linwood Dibbles , who verbally acknowledged these results. Electronically Signed   By: Irish Lack M.D.   On: 10/04/2015 20:24   Dg Hip Unilat  With Pelvis 2-3 Views Right  10/04/2015  CLINICAL DATA:  Fall with right hip pain.  Initial encounter. EXAM: DG HIP (WITH OR WITHOUT PELVIS) 2-3V RIGHT COMPARISON:  None. FINDINGS: Acute and comminuted fracture of the right hip is present which appears to be primarily subtrochanteric. Intertrochanteric component also suspected with lucencies extending into the greater and lesser trochanters. The femoral neck appears intact. No evidence of dislocation. No pelvic fracture or diastasis identified. IMPRESSION: Acute fracture of the right hip which appears to be predominantly subtrochanteric in nature. Intertrochanteric component also suspected. Electronically Signed   By: Irish Lack M.D.   On: 10/04/2015 19:15   I have personally reviewed and evaluated these images and lab results as part of my medical decision-making.    MDM   Final diagnoses:  Hip fracture, right, closed, initial encounter (HCC)  Subdural hematoma (HCC)     Pt's head ct shows subdural fluid collections.  These are new since December.   These may be a combination of new and old fluid collections.  No mass effect.  Pt is neurologically stable.  I will consult with neurosurgery but anticipate he will not require intervention.  Hip xray shows an acute fx.  Family requests Dr  Murphy's group.  I will consult with them.    Plan on medical admission.  Labs are pending.  2145  Discussed with Dr Patric Dykes.  He reviewed the CT scans.  Suspect they are subacute to chronic.  No need for intervention and pt can still have his  hip surgery.   Discussed with Dr Madelon Lips.  Will see patient tomorrow.   Linwood Dibbles, MD 10/04/15 2201

## 2015-10-04 NOTE — ED Notes (Addendum)
Pt is from [facility] and had a fall today, landing on R hip. No loc or head trauma. Pt was in rehab for broken L hip. New deformity/shortening and rotation to R hip. C-collar in place. Alert and oriented at baseline.

## 2015-10-04 NOTE — ED Notes (Signed)
MD at bedside. 

## 2015-10-04 NOTE — ED Notes (Signed)
Nurse drawing labs. 

## 2015-10-05 ENCOUNTER — Inpatient Hospital Stay (HOSPITAL_COMMUNITY): Payer: Medicare Other | Admitting: Anesthesiology

## 2015-10-05 ENCOUNTER — Encounter (HOSPITAL_COMMUNITY)
Admission: EM | Disposition: A | Discharge status: Skilled Nursing Facility | Payer: Self-pay | Source: Home / Self Care | Attending: Internal Medicine

## 2015-10-05 ENCOUNTER — Inpatient Hospital Stay (HOSPITAL_COMMUNITY): Payer: Medicare Other

## 2015-10-05 ENCOUNTER — Encounter (HOSPITAL_COMMUNITY): Payer: Self-pay | Admitting: Certified Registered Nurse Anesthetist

## 2015-10-05 DIAGNOSIS — M109 Gout, unspecified: Secondary | ICD-10-CM | POA: Diagnosis present

## 2015-10-05 DIAGNOSIS — F329 Major depressive disorder, single episode, unspecified: Secondary | ICD-10-CM | POA: Diagnosis present

## 2015-10-05 DIAGNOSIS — K219 Gastro-esophageal reflux disease without esophagitis: Secondary | ICD-10-CM | POA: Diagnosis present

## 2015-10-05 DIAGNOSIS — N400 Enlarged prostate without lower urinary tract symptoms: Secondary | ICD-10-CM

## 2015-10-05 DIAGNOSIS — F32A Depression, unspecified: Secondary | ICD-10-CM | POA: Diagnosis present

## 2015-10-05 HISTORY — PX: FEMUR IM NAIL: SHX1597

## 2015-10-05 LAB — CBG MONITORING, ED: Glucose-Capillary: 100 mg/dL — ABNORMAL HIGH (ref 65–99)

## 2015-10-05 LAB — COMPREHENSIVE METABOLIC PANEL
ALT: 20 U/L (ref 17–63)
AST: 21 U/L (ref 15–41)
Albumin: 3.3 g/dL — ABNORMAL LOW (ref 3.5–5.0)
Alkaline Phosphatase: 146 U/L — ABNORMAL HIGH (ref 38–126)
Anion gap: 8 (ref 5–15)
BILIRUBIN TOTAL: 1.3 mg/dL — AB (ref 0.3–1.2)
BUN: 16 mg/dL (ref 6–20)
CO2: 27 mmol/L (ref 22–32)
CREATININE: 0.71 mg/dL (ref 0.61–1.24)
Calcium: 8.6 mg/dL — ABNORMAL LOW (ref 8.9–10.3)
Chloride: 107 mmol/L (ref 101–111)
Glucose, Bld: 115 mg/dL — ABNORMAL HIGH (ref 65–99)
POTASSIUM: 3.7 mmol/L (ref 3.5–5.1)
Sodium: 142 mmol/L (ref 135–145)
TOTAL PROTEIN: 5.6 g/dL — AB (ref 6.5–8.1)

## 2015-10-05 LAB — PREPARE RBC (CROSSMATCH)

## 2015-10-05 LAB — CBC
HEMATOCRIT: 29.3 % — AB (ref 39.0–52.0)
Hemoglobin: 9.8 g/dL — ABNORMAL LOW (ref 13.0–17.0)
MCH: 32.3 pg (ref 26.0–34.0)
MCHC: 33.4 g/dL (ref 30.0–36.0)
MCV: 96.7 fL (ref 78.0–100.0)
PLATELETS: 184 10*3/uL (ref 150–400)
RBC: 3.03 MIL/uL — AB (ref 4.22–5.81)
RDW: 13.9 % (ref 11.5–15.5)
WBC: 10.2 10*3/uL (ref 4.0–10.5)

## 2015-10-05 SURGERY — INSERTION, INTRAMEDULLARY ROD, FEMUR
Anesthesia: General | Site: Hip | Laterality: Right

## 2015-10-05 MED ORDER — OXYCODONE HCL 5 MG PO TABS: 5.0000 mg | ORAL_TABLET | ORAL | Status: DC | PRN

## 2015-10-05 MED ORDER — LACTATED RINGERS IV SOLN
INTRAVENOUS | Status: DC
  Administered 2015-10-05 (×2): via INTRAVENOUS

## 2015-10-05 MED ORDER — MEPERIDINE HCL 25 MG/ML IJ SOLN: 6.2500 mg | INTRAMUSCULAR | Status: DC | PRN

## 2015-10-05 MED ORDER — TRAMADOL HCL 50 MG PO TABS
100.0000 mg | ORAL_TABLET | Freq: Four times a day (QID) | ORAL | Status: DC
  Administered 2015-10-05 – 2015-10-07 (×9): 100 mg via ORAL
  Filled 2015-10-05 (×9): qty 2

## 2015-10-05 MED ORDER — CEFAZOLIN SODIUM-DEXTROSE 2-3 GM-% IV SOLR
2.0000 g | INTRAVENOUS | Status: AC
  Administered 2015-10-05: 2 g via INTRAVENOUS

## 2015-10-05 MED ORDER — MENTHOL 3 MG MT LOZG: 1.0000 | LOZENGE | OROMUCOSAL | Status: DC | PRN

## 2015-10-05 MED ORDER — ARTIFICIAL TEARS OP OINT
TOPICAL_OINTMENT | OPHTHALMIC | Status: AC
  Filled 2015-10-05: qty 3.5

## 2015-10-05 MED ORDER — GLYCOPYRROLATE 0.2 MG/ML IJ SOLN
INTRAMUSCULAR | Status: AC
  Filled 2015-10-05: qty 1

## 2015-10-05 MED ORDER — ONDANSETRON HCL 4 MG/2ML IJ SOLN: 4.0000 mg | Freq: Four times a day (QID) | INTRAMUSCULAR | Status: DC | PRN

## 2015-10-05 MED ORDER — LACTATED RINGERS IV SOLN: INTRAVENOUS | Status: DC

## 2015-10-05 MED ORDER — CALCIUM CHLORIDE 10 % IV SOLN
INTRAVENOUS | Status: AC
  Filled 2015-10-05: qty 10

## 2015-10-05 MED ORDER — ONDANSETRON HCL 4 MG/2ML IJ SOLN
INTRAMUSCULAR | Status: DC | PRN
  Administered 2015-10-05: 4 mg via INTRAVENOUS
  Administered 2015-10-05: 50 mg via INTRAVENOUS

## 2015-10-05 MED ORDER — PROPOFOL 10 MG/ML IV BOLUS
INTRAVENOUS | Status: DC | PRN
  Administered 2015-10-05: 110 mg via INTRAVENOUS

## 2015-10-05 MED ORDER — ONDANSETRON HCL 4 MG/2ML IJ SOLN
INTRAMUSCULAR | Status: AC
  Filled 2015-10-05: qty 2

## 2015-10-05 MED ORDER — FENTANYL CITRATE (PF) 100 MCG/2ML IJ SOLN
25.0000 ug | INTRAMUSCULAR | Status: DC | PRN
Start: 2015-10-05 — End: 2015-10-05

## 2015-10-05 MED ORDER — ROCURONIUM BROMIDE 100 MG/10ML IV SOLN
INTRAVENOUS | Status: DC | PRN
  Administered 2015-10-05: 40 mg via INTRAVENOUS

## 2015-10-05 MED ORDER — PROPOFOL 10 MG/ML IV BOLUS
INTRAVENOUS | Status: AC
  Filled 2015-10-05: qty 20

## 2015-10-05 MED ORDER — FENTANYL CITRATE (PF) 250 MCG/5ML IJ SOLN
INTRAMUSCULAR | Status: AC
  Filled 2015-10-05: qty 5

## 2015-10-05 MED ORDER — SENNOSIDES-DOCUSATE SODIUM 8.6-50 MG PO TABS
1.0000 | ORAL_TABLET | Freq: Every evening | ORAL | Status: DC | PRN
  Administered 2015-10-06 – 2015-10-07 (×2): 1 via ORAL
  Filled 2015-10-05 (×2): qty 1

## 2015-10-05 MED ORDER — CEFAZOLIN SODIUM-DEXTROSE 2-3 GM-% IV SOLR
2.0000 g | Freq: Four times a day (QID) | INTRAVENOUS | Status: AC
  Administered 2015-10-06 (×2): 2 g via INTRAVENOUS
  Filled 2015-10-05 (×2): qty 50

## 2015-10-05 MED ORDER — FLEET ENEMA 7-19 GM/118ML RE ENEM: 1.0000 | ENEMA | Freq: Once | RECTAL | Status: DC | PRN

## 2015-10-05 MED ORDER — ARTIFICIAL TEARS OP OINT
TOPICAL_OINTMENT | OPHTHALMIC | Status: DC | PRN
  Administered 2015-10-05: 1 via OPHTHALMIC

## 2015-10-05 MED ORDER — DOCUSATE SODIUM 100 MG PO CAPS
100.0000 mg | ORAL_CAPSULE | Freq: Two times a day (BID) | ORAL | Status: DC
  Administered 2015-10-05 – 2015-10-09 (×7): 100 mg via ORAL
  Filled 2015-10-05 (×7): qty 1

## 2015-10-05 MED ORDER — PROPOFOL 500 MG/50ML IV EMUL
INTRAVENOUS | Status: DC | PRN
  Administered 2015-10-05: 15 ug/kg/min via INTRAVENOUS

## 2015-10-05 MED ORDER — GLYCOPYRROLATE 0.2 MG/ML IJ SOLN
INTRAMUSCULAR | Status: DC | PRN
  Administered 2015-10-05: 0.1 mg via INTRAVENOUS
  Administered 2015-10-05: .4 mg via INTRAVENOUS

## 2015-10-05 MED ORDER — CALCIUM CHLORIDE 10 % IV SOLN
INTRAVENOUS | Status: DC | PRN
  Administered 2015-10-05: 0.5 g via INTRAVENOUS

## 2015-10-05 MED ORDER — PHENYLEPHRINE HCL 10 MG/ML IJ SOLN
INTRAMUSCULAR | Status: DC | PRN
  Administered 2015-10-05 (×2): 200 ug via INTRAVENOUS

## 2015-10-05 MED ORDER — NEOSTIGMINE METHYLSULFATE 10 MG/10ML IV SOLN
INTRAVENOUS | Status: DC | PRN
  Administered 2015-10-05: 3 mg via INTRAVENOUS

## 2015-10-05 MED ORDER — METOCLOPRAMIDE HCL 5 MG PO TABS: 5.0000 mg | ORAL_TABLET | Freq: Three times a day (TID) | ORAL | Status: DC | PRN

## 2015-10-05 MED ORDER — LIDOCAINE HCL (CARDIAC) 20 MG/ML IV SOLN
INTRAVENOUS | Status: DC | PRN
  Administered 2015-10-05: 50 mg via INTRATRACHEAL

## 2015-10-05 MED ORDER — MIDAZOLAM HCL 2 MG/2ML IJ SOLN
INTRAMUSCULAR | Status: AC
  Filled 2015-10-05: qty 2

## 2015-10-05 MED ORDER — PROMETHAZINE HCL 25 MG/ML IJ SOLN: 6.2500 mg | INTRAMUSCULAR | Status: DC | PRN

## 2015-10-05 MED ORDER — BISACODYL 10 MG RE SUPP: 10.0000 mg | Freq: Every day | RECTAL | Status: DC | PRN

## 2015-10-05 MED ORDER — FENTANYL CITRATE (PF) 250 MCG/5ML IJ SOLN
INTRAMUSCULAR | Status: DC | PRN
  Administered 2015-10-05: 50 ug via INTRAVENOUS

## 2015-10-05 MED ORDER — CEFAZOLIN SODIUM-DEXTROSE 2-3 GM-% IV SOLR
INTRAVENOUS | Status: AC
  Administered 2015-10-06: 07:00:00
  Filled 2015-10-05: qty 50

## 2015-10-05 MED ORDER — LIDOCAINE HCL (CARDIAC) 20 MG/ML IV SOLN
INTRAVENOUS | Status: AC
  Filled 2015-10-05: qty 5

## 2015-10-05 MED ORDER — ACETAMINOPHEN 325 MG PO TABS: 650.0000 mg | ORAL_TABLET | Freq: Four times a day (QID) | ORAL | Status: DC | PRN

## 2015-10-05 MED ORDER — PHENOL 1.4 % MT LIQD: 1.0000 | OROMUCOSAL | Status: DC | PRN

## 2015-10-05 MED ORDER — EPHEDRINE SULFATE 50 MG/ML IJ SOLN
INTRAMUSCULAR | Status: DC | PRN
  Administered 2015-10-05 (×2): 25 mg via INTRAVENOUS

## 2015-10-05 MED ORDER — SODIUM CHLORIDE 0.9 % IV SOLN
INTRAVENOUS | Status: DC
  Administered 2015-10-05 – 2015-10-06 (×2): via INTRAVENOUS

## 2015-10-05 MED ORDER — ROCURONIUM BROMIDE 50 MG/5ML IV SOLN
INTRAVENOUS | Status: AC
  Filled 2015-10-05: qty 1

## 2015-10-05 MED ORDER — ONDANSETRON HCL 4 MG PO TABS: 4.0000 mg | ORAL_TABLET | Freq: Four times a day (QID) | ORAL | Status: DC | PRN

## 2015-10-05 MED ORDER — DEXTROSE 5 % IV SOLN
10.0000 mg | INTRAVENOUS | Status: DC | PRN
  Administered 2015-10-05: 100 ug/min via INTRAVENOUS

## 2015-10-05 MED ORDER — ACETAMINOPHEN 650 MG RE SUPP: 650.0000 mg | Freq: Four times a day (QID) | RECTAL | Status: DC | PRN

## 2015-10-05 MED ORDER — METOCLOPRAMIDE HCL 5 MG/ML IJ SOLN: 5.0000 mg | Freq: Three times a day (TID) | INTRAMUSCULAR | Status: DC | PRN

## 2015-10-05 SURGICAL SUPPLY — 39 items
COVER MAYO STAND STRL (DRAPES) IMPLANT
COVER PERINEAL POST (MISCELLANEOUS) ×3 IMPLANT
COVER SURGICAL LIGHT HANDLE (MISCELLANEOUS) ×3 IMPLANT
DRAPE ORTHO SPLIT 77X108 STRL (DRAPES)
DRAPE PROXIMA HALF (DRAPES) ×6 IMPLANT
DRAPE STERI IOBAN 125X83 (DRAPES) IMPLANT
DRAPE SURG ORHT 6 SPLT 77X108 (DRAPES) IMPLANT
DRSG ADAPTIC 3X8 NADH LF (GAUZE/BANDAGES/DRESSINGS) ×3 IMPLANT
DRSG MEPILEX BORDER 4X4 (GAUZE/BANDAGES/DRESSINGS) ×3 IMPLANT
DRSG MEPILEX BORDER 4X8 (GAUZE/BANDAGES/DRESSINGS) ×3 IMPLANT
DURAPREP 26ML APPLICATOR (WOUND CARE) ×3 IMPLANT
ELECT REM PT RETURN 9FT ADLT (ELECTROSURGICAL) ×3
ELECTRODE REM PT RTRN 9FT ADLT (ELECTROSURGICAL) ×1 IMPLANT
EVACUATOR 1/8 PVC DRAIN (DRAIN) IMPLANT
GLOVE BIOGEL M STRL SZ7.5 (GLOVE) ×3 IMPLANT
GLOVE BIOGEL PI IND STRL 8 (GLOVE) ×4 IMPLANT
GLOVE BIOGEL PI INDICATOR 8 (GLOVE) ×8
GLOVE ORTHO TXT STRL SZ7.5 (GLOVE) ×18 IMPLANT
GLOVE SURG ORTHO 8.0 STRL STRW (GLOVE) ×12 IMPLANT
GOWN STRL REUS W/ TWL LRG LVL3 (GOWN DISPOSABLE) ×2 IMPLANT
GOWN STRL REUS W/ TWL XL LVL3 (GOWN DISPOSABLE) ×3 IMPLANT
GOWN STRL REUS W/TWL LRG LVL3 (GOWN DISPOSABLE) ×4
GOWN STRL REUS W/TWL XL LVL3 (GOWN DISPOSABLE) ×6
GUIDEPIN 3.2X17.5 THRD DISP (PIN) ×3 IMPLANT
GUIDEWIRE BALL NOSE 80CM (WIRE) ×3 IMPLANT
KIT BASIN OR (CUSTOM PROCEDURE TRAY) ×3 IMPLANT
KIT ROOM TURNOVER OR (KITS) ×3 IMPLANT
LINER BOOT UNIVERSAL DISP (MISCELLANEOUS) ×3 IMPLANT
MANIFOLD NEPTUNE II (INSTRUMENTS) ×3 IMPLANT
NAIL HIP FRACTURE 11X380MM (Nail) ×3 IMPLANT
NS IRRIG 1000ML POUR BTL (IV SOLUTION) ×3 IMPLANT
PACK GENERAL/GYN (CUSTOM PROCEDURE TRAY) ×3 IMPLANT
PAD ARMBOARD 7.5X6 YLW CONV (MISCELLANEOUS) ×6 IMPLANT
SCREW LAG 10.5MMX105MM HFN (Screw) ×3 IMPLANT
STAPLER VISISTAT 35W (STAPLE) ×3 IMPLANT
SUT VIC AB 1 CTB1 27 (SUTURE) ×6 IMPLANT
SUT VIC AB 2-0 CT1 27 (SUTURE) ×4
SUT VIC AB 2-0 CT1 TAPERPNT 27 (SUTURE) ×2 IMPLANT
WATER STERILE IRR 1000ML POUR (IV SOLUTION) ×9 IMPLANT

## 2015-10-05 NOTE — Progress Notes (Signed)
Pt has a pair of hearing aids in room. Also has a pair of glasses. Not on pt at this time due to sedated state

## 2015-10-05 NOTE — Anesthesia Postprocedure Evaluation (Signed)
Anesthesia Post Note  Patient: Kristopher [name]  Procedure(s) Performed: Procedure(s) (LRB): INTRAMEDULLARY (IM) NAIL FEMORAL (Right)  Patient location during evaluation: PACU Anesthesia Type: General Level of consciousness: awake and alert Pain management: pain level controlled Vital Signs Assessment: post-procedure vital signs reviewed and stable Respiratory status: spontaneous breathing, nonlabored ventilation, respiratory function stable and patient connected to nasal cannula oxygen Cardiovascular status: blood pressure returned to baseline and stable Postop Assessment: no signs of nausea or vomiting Anesthetic complications: no    Last Vitals:  Filed Vitals:   10/05/15 1704 10/05/15 1745  BP:  117/52  Pulse:    Temp: 36.4 C 36.8 C  Resp:      Last Pain: There were no vitals filed for this visit.               Shelton SilvasKevin D Dodie Parisi

## 2015-10-05 NOTE — Transfer of Care (Signed)
Immediate Anesthesia Transfer of Care Note  Patient: Kristopher [name]  Procedure(s) Performed: Procedure(s): INTRAMEDULLARY (IM) NAIL FEMORAL (Right)  Patient Location: PACU  Anesthesia Type:General  Level of Consciousness: patient cooperative and responds to stimulation  Airway & Oxygen Therapy: Patient Spontanous Breathing and Patient connected to face mask oxygen  Post-op Assessment: Report given to RN and Post -op Vital signs reviewed and stable  Post vital signs: Reviewed and stable  Last Vitals:  Filed Vitals:   10/05/15 1000 10/05/15 1015  BP: 158/90   Pulse:  85  Temp:    Resp: 16 20    Complications: No apparent anesthesia complications

## 2015-10-05 NOTE — Discharge Instructions (Signed)
INSTRUCTIONS HIP FRACTURE   o Remove items at home which could result in a fall. This includes throw rugs or furniture in walking pathways o ICE to the affected joint every three hours while awake for 30 minutes at a time, for at least the first 3-5 days, and then as needed for pain and swelling.  Continue to use ice for pain and swelling. You may notice swelling that will progress down to the foot and ankle.  This is normal after surgery.  Elevate your leg when you are not up walking on it.   o Continue to use the breathing machine you got in the hospital (incentive spirometer) which will help keep your temperature down.  It is common for your temperature to cycle up and down following surgery, especially at night when you are not up moving around and exerting yourself.  The breathing machine keeps your lungs expanded and your temperature down.   DIET:  As you were doing prior to hospitalization, we recommend a well-balanced diet.  DRESSING / WOUND CARE / SHOWERING  You may change your dressing 3-5 days after surgery.  Then change the dressing every day with sterile gauze.  Please use good hand washing techniques before changing the dressing.  Do not use any lotions or creams on the incision until instructed by your surgeon.  ACTIVITY  o Increase activity slowly as tolerated, but follow the weight bearing instructions below.   o No driving for 6 weeks or until further direction given by your physician.  You cannot drive while taking narcotics.  o No lifting or carrying greater than 10 lbs. until further directed by your surgeon. o Avoid periods of inactivity such as sitting longer than an hour when not asleep. This helps prevent blood clots.  o You may return to work once you are authorized by your doctor.     WEIGHT BEARING   Weight bearing as tolerated with assist device (walker, cane, etc) as directed, use it as long as suggested by your surgeon or therapist, typically at least 4-6  weeks.   EXERCISES  Exercises include:    Quad Sets - Tighten up the muscle on the front of the thigh (Quad) and hold for 5-10 seconds.    Straight Leg Raises - With your knee straight (if you were given a brace, keep it on), lift the leg to 60 degrees, hold for 3 seconds, and slowly lower the leg.  Perform this exercise against resistance later as your leg gets stronger.   Leg Slides: Lying on your back, slowly slide your foot toward your buttocks, bending your knee up off the floor (only go as far as is comfortable). Then slowly slide your foot back down until your leg is flat on the floor again.   Angel Wings: Lying on your back spread your legs to the side as far apart as you can without causing discomfort.   Hamstring Strength:  Lying on your back, push your heel against the floor with your leg straight by tightening up the muscles of your buttocks.  Repeat, but this time bend your knee to a comfortable angle, and push your heel against the floor.  You may put a pillow under the heel to make it more comfortable if necessary.       CONSTIPATION  Constipation is defined medically as fewer than three stools per week and severe constipation as less than one stool per week.  Even if you have a regular bowel pattern at home,  your normal regimen is likely to be disrupted due to multiple reasons following surgery.  Combination of anesthesia, postoperative narcotics, change in appetite and fluid intake all can affect your bowels.   YOU MUST use at least one of the following options; they are listed in order of increasing strength to get the job done.  They are all available over the counter, and you may need to use some, POSSIBLY even all of these options:    Drink plenty of fluids (prune juice may be helpful) and high fiber foods Colace 100 mg by mouth twice a day  Senokot for constipation as directed and as needed Dulcolax (bisacodyl), take with full glass of water  Miralax (polyethylene  glycol) once or twice a day as needed.  If you have tried all these things and are unable to have a bowel movement in the first 3-4 days after surgery call either your surgeon or your primary doctor.    If you experience loose stools or diarrhea, hold the medications until you stool forms back up.  If your symptoms do not get better within 1 week or if they get worse, check with your doctor.  If you experience "the worst abdominal pain ever" or develop nausea or vomiting, please contact the office immediately for further recommendations for treatment.   ITCHING:  If you experience itching with your medications, try taking only a single pain pill, or even half a pain pill at a time.  You can also use Benadryl over the counter for itching or also to help with sleep.   TED HOSE STOCKINGS:  Use stockings on both legs until for at least 2 weeks or as directed by physician office. They may be removed at night for sleeping.  MEDICATIONS:  See your medication summary on the After Visit Summary that nursing will review with you.  You may have some home medications which will be placed on hold until you complete the course of blood thinner medication.  It is important for you to complete the blood thinner medication as prescribed.  PRECAUTIONS:  If you experience chest pain or shortness of breath - call 911 immediately for transfer to the hospital emergency department.   If you develop a fever greater that 101 F, purulent drainage from wound, increased redness or drainage from wound, foul odor from the wound/dressing, or calf pain - CONTACT YOUR SURGEON.                                                   FOLLOW-UP APPOINTMENTS:  If you do not already have a post-op appointment, please call the office for an appointment to be seen by your surgeon.  Guidelines for how soon to be seen are listed in your After Visit Summary, but are typically between 1-4 weeks after surgery.   MAKE SURE YOU:   Understand  these instructions.   Get help right away if you are not doing well or get worse.    Thank you for letting us be a part of your medical care team.  It is a privilege we respect greatly.  We hope these instructions will help you stay on track for a fast and full recovery!

## 2015-10-05 NOTE — Progress Notes (Signed)
PT Cancellation Note  Patient Details Name: Eulah Citizendwin Uplinger MRN: [id number] DOB: [birth year]   Cancelled Treatment:    Reason Eval/Treat Not Completed: PT screened, no needs identified, will sign off (to have hip surgery. )   Rada HayHill, [place] 10/05/2015, 6:52 AM Blanchard KelchKaren Keidrick Murty PT [phone]

## 2015-10-05 NOTE — ED Notes (Signed)
rn spoke to ortho, they are coming to see the pt in the next hour or so. Asking if pt could be switched from stepdown to a tele bed. [facility] surgery today. rn will page hospitalist and ask.

## 2015-10-05 NOTE — Progress Notes (Addendum)
Patient Demographics  Kristopher [name], is a 82 y.o. male, DOB - [birth year], ZOX:[id number]  Admit date - 10/04/2015   Admitting Physician Lorretta Harp, MD  Outpatient Primary MD for the patient is Ginette Otto, MD  LOS - 1   Chief Complaint  Patient presents with  . Fall       Admission HPI/Brief narrative: 82 y.o. male with PMH of dementia, frequent fall, multiple bone fracture in the past, CAD, S/P CABG, atrial fibrillation not on anticoagulants, BPH, stroke, GI bleeding, pneumothorax, GERD, gout, depression, hypertension, who presents with right hip pain after fall. Work up was significant for subdural hematoma, no surgical intervention required by neurosurgery, as well right hip fracture, the plan is for surgical repair this afternoon by Dr.Caffrey at [facility], patient to be transferred to [facility] regarding our availability.  Subjective:   Kristopher [name] today has, No headache, No chest pain, No abdominal pain - No Nausea, has left hip pain.   Assessment & Plan    Principal Problem:   Closed fracture of right hip (HCC) Active Problems:   History of coronary artery bypass graft   History of GI bleed   Paroxysmal atrial fibrillation (HCC)   Personal history of arterial venous malformation (AVM)   Hip fracture (HCC)   Fall   Essential hypertension   Anterior displaced fracture of proximal clavicle with delayed healing   Dementia due to general medical condition without behavioral disturbance   Protein-calorie malnutrition, severe (HCC)   Pneumothorax on right   SDH (subdural hematoma) (HCC)   BPH (benign prostatic hyperplasia)   GERD (gastroesophageal reflux disease)   Gout   Depression  Closed fracture of right hip (HCC) - As evidenced by x-ray. No neurovascular compromise. Orthopedic surgeon. Dr. Madelon Lips asked to transfer patient to [facility] for surgical repair this  afternoon. - Continue with when necessary pain medication. - 2-D echo in December 2016 with LVH, possibly normal EF and no wall motion abnormality, denies any chest pain or shortness of breath, and A. fib, but rate controlled(NSR), no absolute contraindication for surgical repair of hip fracture because of subdural hematoma as per neurosurgery, Patient  is moderate risk for surgical intervention, this discussed with daughter, and she understands risk and agreeable to proceed. - SCD for DVT prophylaxis given subdural hematoma.  SDH (subdural hematoma) (HCC):  -  has bilateral subacute/chronic SDH with miniscule areas of superimposed acute hemorrhage, - neurosurgery consulted by ED. Dr. Conchita Paris,  no need for surgery. Pt can be followed up in his office in about 2 weeks for f/u of the SDH [facility] [phone]). -f/u with Dr. Conchita Paris  CAD - s/p of CABG: No CP. -no ASA due to SDH  PAF - CHA2DS2-VASc Score is 5, not on anticoagulation secondary to multiple falls, GI bleed, and currently has subdural hematoma  -continue cardizem ,NSR  Hypertension - Blood pressure acceptable, continue with Cardizem  Severe protein calorie malnutrition - Continue with ensure  Dementia - Continue with Donepezil  BPH - Continue Flomax and proscar  Gout:  - continue Aloopurinol  Depression - Continue  Zoloft   Code Status: DO NOT RESUSCITATE  Family Communication: Discussed with patient and daughter  Disposition Plan: Pending further workup  Procedures  None   Consults   Orthopedic Dr. Madelon Lips   Medications  Scheduled Meds: . allopurinol  300 mg Oral Daily  . diltiazem  120 mg Oral Daily  . donepezil  5 mg Oral QHS  . feeding supplement (ENSURE ENLIVE)  237 mL Oral TID BM  . finasteride  5 mg Oral Daily  . montelukast  10 mg Oral Daily  . pantoprazole  40 mg Oral BID  . sertraline  50 mg Oral Daily  . sodium chloride  3 mL Intravenous Q12H  .  tamsulosin  0.4 mg Oral Daily   Continuous Infusions: . sodium chloride Stopped (10/05/15 1020)   PRN Meds:.acetaminophen **OR** acetaminophen, albuterol, guaiFENesin, hydrALAZINE, morphine injection, oxyCODONE-acetaminophen  DVT Prophylaxis   SCDs   Lab Results  Component Value Date   PLT 184 10/05/2015    Antibiotics    Anti-infectives    None          Objective:   Filed Vitals:   10/05/15 0720 10/05/15 0812 10/05/15 1000 10/05/15 1015  BP:  152/86 158/90   Pulse: 86 84  85  Temp:  98.8 F (37.1 C)    TempSrc:  Oral    Resp: 19 20 16 20   SpO2: 98% 98%  98%    Wt Readings from Last 3 Encounters:  09/12/15 51.302 kg (113 lb 1.6 oz)  06/14/15 67.586 kg (149 lb)  01/20/15 70.818 kg (156 lb 2 oz)     Intake/Output Summary (Last 24 hours) at 10/05/15 1028 Last data filed at 10/05/15 1021  Gross per 24 hour  Intake   1000 ml  Output    600 ml  Net    400 ml     Physical Exam  Awake Alert ,confused Supple Neck,No JVD Symmetrical Chest wall movement, Good air movement bilaterally RRR,No Gallops,Rubss, No Parasternal Heave +ve B.Sounds, Abd Soft, No tenderness, No organomegaly appriciated, No rebound - guarding or rigidity. No Cyanosis, Clubbing or edema, right lower extremity, right lower extremity rotated and shortened   Data Review   Micro Results No results found for this or any previous visit (from the past 240 hour(s)).  Radiology Reports Dg Chest 1 View  10/04/2015  CLINICAL DATA:  Fall.  Right hip fracture. EXAM: CHEST 1 VIEW COMPARISON:  09/10/2015 FINDINGS: Previous median sternotomy and CABG procedure. Aortic atherosclerosis noted. The heart size and mediastinal contours are within normal limits. Both lungs are clear. The visualized skeletal structures are unremarkable. IMPRESSION: No active disease. Electronically Signed   By: Signa Kell M.D.   On: 10/04/2015 19:09   Dg Chest 1 View  09/07/2015  CLINICAL DATA:  Fall. EXAM: CHEST 1 VIEW  COMPARISON:  December 19, 2013. FINDINGS: The heart size and mediastinal contours are within normal limits. Minimal right apical pneumothorax is noted. Moderately displaced fracture is seen involving the lateral portion of the right fourth rib. Left lung is clear. No significant pleural effusion is noted. Mildly displaced distal right clavicular fracture is noted, as well as mildly displaced fracture involving the acromion. Atherosclerosis of thoracic aorta is noted. IMPRESSION: Mildly displaced fractures are seen involving the distal right clavicle and acromion. Moderately displaced fracture involving lateral portion of right fourth rib. Minimal right apical pneumothorax is noted. Critical Value/emergent results were called by telephone at the time of interpretation on 09/07/2015 at 12:26 pm to Dr. Tilden Fossa , who verbally acknowledged these results. Electronically Signed   By: Lupita Raider, M.D.   On: 09/07/2015 12:27  Dg Clavicle Right  09/08/2015  CLINICAL DATA:  Clavicle fracture. EXAM: RIGHT CLAVICLE - 2+ VIEWS COMPARISON:  09/08/2015. FINDINGS: Slightly displaced fracture of the distal clavicle is noted. Fracture of the acromion process is noted.Moderate size right apical pneumothorax noted. Displaced fracture of the posterior lateral aspect of the right fourth rib noted. Prior median sternotomy . IMPRESSION: 1. Slightly displaced fracture of the distal clavicle. 2.  Fracture of the acromion process noted. 3. Displaced fracture of the posterior lateral aspect of the right fourth rib noted. 4.  Moderate size right apical pneumothorax. Critical Value/emergent results were called by telephone at the time of interpretation on 09/08/2015 at 8:06 am to nurse [facility] who verbally acknowledged these results. Electronically Signed   By: Maisie Fus  Register   On: 09/08/2015 08:10   Ct Head Wo Contrast  10/04/2015  CLINICAL DATA:  Fall at nursing home with acute right hip fracture. Initial encounter. EXAM: CT  HEAD WITHOUT CONTRAST CT CERVICAL SPINE WITHOUT CONTRAST TECHNIQUE: Multidetector CT imaging of the head and cervical spine was performed following the standard protocol without intravenous contrast. Multiplanar CT image reconstructions of the cervical spine were also generated. COMPARISON:  09/07/2015 FINDINGS: CT HEAD FINDINGS Since the prior study, the patient has developed bilateral subdural fluid collections overlying the convexities. The left-sided collection may be slightly larger than the left and measures approximately 12 mm in thickness. There are several foci of increased density in both subdural collections consistent with more acute age blood. For the most part these subdural collections are lower density and likely consistent with older blood product. No significant mass effect is identified. Stable atrophy and small vessel disease present. No hydrocephalus or acute infarction. No skull fracture identified. CT CERVICAL SPINE FINDINGS The cervical spine shows normal alignment. There is no evidence of acute fracture or subluxation. No soft tissue swelling or hematoma is identified. Stable advanced spondylosis of the cervical spine with near complete loss of disc space height at C4-5, partially fused C5-6 level and moderate disc disease at C3-4 and C6-7. No bony or soft tissue lesions are seen. The visualized airway is normally patent. Previous displaced medial right clavicle fracture shows poor healing since the prior CT with some interval callus formation. Residual deformity is present. Previously noted right apical pneumothorax has resolved. IMPRESSION: 1. Development of bilateral subdural fluid collections overlying the convexities, slightly larger on the left, consistent with interval subdural hemorrhages. There are several foci of higher density in these collections suggestive of more acute aged blood. Subdural collections are not causing significant mass effect. 2. No evidence of acute cervical  spine fracture. Stable advanced cervical disc disease. 3. Poor healing of displaced medial right clavicular fracture since prior CT. 4. Resolution of previously visualized right apical pneumothorax. 5. These results were called by telephone at the time of interpretation on 10/04/2015 at 8:20 pm to Dr. Linwood Dibbles , who verbally acknowledged these results. Electronically Signed   By: Irish Lack M.D.   On: 10/04/2015 20:24   Ct Head Wo Contrast  09/07/2015  CLINICAL DATA:  Found laying on bathroom floor, laceration along the right forehead. Fall. EXAM: CT HEAD WITHOUT CONTRAST CT CERVICAL SPINE WITHOUT CONTRAST TECHNIQUE: Multidetector CT imaging of the head and cervical spine was performed following the standard protocol without intravenous contrast. Multiplanar CT image reconstructions of the cervical spine were also generated. COMPARISON:  09/06/2015 FINDINGS: CT HEAD FINDINGS The brainstem, cerebellum, cerebral peduncles, thalami, basal ganglia, basilar cisterns, and ventricular system appear within normal limits.  Periventricular white matter and corona radiata hypodensities favor chronic ischemic microvascular white matter disease. No intracranial hemorrhage, mass lesion, or acute CVA. Right frontal scalp laceration. Acute on chronic right maxillary sinusitis with chronic ethmoid and chronic left maxillary sinusitis. There is atherosclerotic calcification of the cavernous carotid arteries bilaterally. CT CERVICAL SPINE FINDINGS Calcified pannus posterior to the odontoid. Similar appearance of 3 mm degenerative retrolisthesis at C4-5. 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 The left C2- 3 and C3-4 facet joints are fused. There is some bridging interbody spurring at C3-4. Degenerative endplate sclerosis at C4-5 and C6-7 both with posterior osseous ridging. No cervical spine fracture or acute subluxation is identified. There is evidence of moderate central narrowing of the thecal sac  at C4-5 due to subluxation and spurring. Osseous right foraminal stenosis is severe at C4-5 and mild to moderate at C3-4. Spurring causes left foraminal stenosis at C3-4, C4-5, and C6-7. No prevertebral soft tissue swelling. There is a small right apical pneumothorax. Fracture the right second rib medially, observed on image 20 series 8. Displaced medial clavicular fracture on the right. Based on conventional radiography today the patient is also thought to have lateral clavicular fracture on the right as well as a lateral acromial fracture. IMPRESSION: 1. Small right apical pneumothorax with fracture of the right second rib medially. Right medial clavicular fracture. Based on today's conventional radiographs the patient is also thought to have a right lateral clavicular fracture as well as other right rib fractures including the right third and fourth ribs. 2. Right frontal scalp laceration.  No acute intracranial findings. 3. Cervical spondylosis and degenerative disc disease with multilevel impingement, but no acute cervical spine fracture. These results were called by telephone at the time of interpretation on 09/07/2015 at 12:12 pm to Dr. Tilden Fossa , who verbally acknowledged these results. Electronically Signed   By: Gaylyn Rong M.D.   On: 09/07/2015 12:15   Ct Chest W Contrast  09/07/2015  CLINICAL DATA:  Larey Seat today.  Rib fractures and pneumothorax. EXAM: CT CHEST, ABDOMEN, AND PELVIS WITH CONTRAST TECHNIQUE: Multidetector CT imaging of the chest, abdomen and pelvis was performed following the standard protocol during bolus administration of intravenous contrast. CONTRAST:  80mL OMNIPAQUE IOHEXOL 300 MG/ML  SOLN COMPARISON:  Chest x-ray 09/07/2015 and CT abdomen/ pelvis 01/10/2014 FINDINGS: CT CHEST FINDINGS Mediastinum/Lymph Nodes: Right-sided chest wall edema/ hematoma likely related to right clavicle fracture. No chest wall mass, supraclavicular or axillary adenopathy. The thyroid gland is  grossly normal. The major vascular structures are patent. The heart is normal in size for age. No pericardial effusion. Moderate tortuosity, ectasia and calcification of the thoracic aorta but no focal aneurysm or dissection. Three-vessel coronary artery calcifications are noted. Small amount of probable mediastinal hematoma likely from the displace clavicle fracture near the sternoclavicular joint. The pulmonary arteries appear normal. The esophagus is grossly normal. Lungs/Pleura: There is a moderate-sized anterior pneumothorax estimated at 15%. There are also small pleural effusions and bibasilar atelectasis. No pulmonary contusion or worrisome pulmonary lesions. Musculoskeletal: Displaced clavicle fracture noted at the sternoclavicular joint. The anterior second and third ribs are fractured. There is also a fourth rib fracture laterally. No left-sided rib fractures are identified. The vertebral bodies are intact. No sternal fracture. Sternal wires related to prior bypass surgery. CT ABDOMEN PELVIS FINDINGS Hepatobiliary: No focal hepatic hepatic lesions or intrahepatic biliary dilatation. No acute hepatic injury. The gallbladder is mildly distended. No acute inflammation. No common bile duct dilatation. Pancreas: No  mass, inflammation or acute injury. Spleen: Intact.  No acute injury. Adrenals/Urinary Tract: The adrenal glands are normal. There are multiple bilateral renal calculi and renal cortical thinning but no acute renal injury. Stomach/Bowel: The stomach, duodenum, small bowel and colon are grossly normal without oral contrast. Vascular/Lymphatic: Moderate atherosclerotic calcifications involving the aorta and branch vessels but no dissection or focal aneurysm. Fairly significant bilateral renal artery calcifications. Small infrarenal abdominal aortic aneurysm just above the iliac artery bifurcation measuring 3.0 x 3.0 cm. Reproductive: The prostate gland is enlarged. There is significant median lobe  hypertrophy projecting well up into the bladder. This appears relatively stable since 2015. Other: Small amount of free pelvic fluid is noted of uncertain significance or etiology. Musculoskeletal: There is a fracture involving the greater trochanter of the left hip. No definite neck or intertrochanteric fracture. There are advanced degenerative changes involving both hips. There is diffuse osteo per [place] and advanced degenerative changes involving the spine. IMPRESSION: 1. Displaced proximal right clavicle fracture near the sternoclavicular joint with moderate surrounding hematoma. 2. Second, third and fourth right rib fractures. 3. Estimated 15% right-sided pneumothorax. No pulmonary contusions. Small effusions and bibasilar atelectasis. 4. No acute solid organ injury is identified in the abdomen. There is free pelvic fluid noted in the right pericolic gutter and pelvis of uncertain etiology or significance. Could not exclude a subtle bowel injury. No free air is identified. Recommend close clinical follow-up. 5. Numerous bilateral renal calculi. 6. Advanced atherosclerotic calcifications involving the thoracic and abdominal aortas and branch vessels. 7. Significant median lobe hypertrophy of the prostate gland. 8. Comminuted fracture involving the greater trochanter of the left hip. 9. Left inferior pubic ramus fracture. Electronically Signed   By: Rudie Meyer M.D.   On: 09/07/2015 13:15   Ct Cervical Spine Wo Contrast  10/04/2015  CLINICAL DATA:  Fall at nursing home with acute right hip fracture. Initial encounter. EXAM: CT HEAD WITHOUT CONTRAST CT CERVICAL SPINE WITHOUT CONTRAST TECHNIQUE: Multidetector CT imaging of the head and cervical spine was performed following the standard protocol without intravenous contrast. Multiplanar CT image reconstructions of the cervical spine were also generated. COMPARISON:  09/07/2015 FINDINGS: CT HEAD FINDINGS Since the prior study, the patient has developed  bilateral subdural fluid collections overlying the convexities. The left-sided collection may be slightly larger than the left and measures approximately 12 mm in thickness. There are several foci of increased density in both subdural collections consistent with more acute age blood. For the most part these subdural collections are lower density and likely consistent with older blood product. No significant mass effect is identified. Stable atrophy and small vessel disease present. No hydrocephalus or acute infarction. No skull fracture identified. CT CERVICAL SPINE FINDINGS The cervical spine shows normal alignment. There is no evidence of acute fracture or subluxation. No soft tissue swelling or hematoma is identified. Stable advanced spondylosis of the cervical spine with near complete loss of disc space height at C4-5, partially fused C5-6 level and moderate disc disease at C3-4 and C6-7. No bony or soft tissue lesions are seen. The visualized airway is normally patent. Previous displaced medial right clavicle fracture shows poor healing since the prior CT with some interval callus formation. Residual deformity is present. Previously noted right apical pneumothorax has resolved. IMPRESSION: 1. Development of bilateral subdural fluid collections overlying the convexities, slightly larger on the left, consistent with interval subdural hemorrhages. There are several foci of higher density in these collections suggestive of more acute aged blood. Subdural  collections are not causing significant mass effect. 2. No evidence of acute cervical spine fracture. Stable advanced cervical disc disease. 3. Poor healing of displaced medial right clavicular fracture since prior CT. 4. Resolution of previously visualized right apical pneumothorax. 5. These results were called by telephone at the time of interpretation on 10/04/2015 at 8:20 pm to Dr. Linwood Dibbles , who verbally acknowledged these results. Electronically Signed   By:  Irish Lack M.D.   On: 10/04/2015 20:24   Ct Cervical Spine Wo Contrast  09/07/2015  CLINICAL DATA:  Found laying on bathroom floor, laceration along the right forehead. Fall. EXAM: CT HEAD WITHOUT CONTRAST CT CERVICAL SPINE WITHOUT CONTRAST TECHNIQUE: Multidetector CT imaging of the head and cervical spine was performed following the standard protocol without intravenous contrast. Multiplanar CT image reconstructions of the cervical spine were also generated. COMPARISON:  09/06/2015 FINDINGS: CT HEAD FINDINGS The brainstem, cerebellum, cerebral peduncles, thalami, basal ganglia, basilar cisterns, and ventricular system appear within normal limits. Periventricular white matter and corona radiata hypodensities favor chronic ischemic microvascular white matter disease. No intracranial hemorrhage, mass lesion, or acute CVA. Right frontal scalp laceration. Acute on chronic right maxillary sinusitis with chronic ethmoid and chronic left maxillary sinusitis. There is atherosclerotic calcification of the cavernous carotid arteries bilaterally. CT CERVICAL SPINE FINDINGS Calcified pannus posterior to the odontoid. Similar appearance of 3 mm degenerative retrolisthesis at C4-5. 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 The left C2- 3 and C3-4 facet joints are fused. There is some bridging interbody spurring at C3-4. Degenerative endplate sclerosis at C4-5 and C6-7 both with posterior osseous ridging. No cervical spine fracture or acute subluxation is identified. There is evidence of moderate central narrowing of the thecal sac at C4-5 due to subluxation and spurring. Osseous right foraminal stenosis is severe at C4-5 and mild to moderate at C3-4. Spurring causes left foraminal stenosis at C3-4, C4-5, and C6-7. No prevertebral soft tissue swelling. There is a small right apical pneumothorax. Fracture the right second rib medially, observed on image 20 series 8. Displaced medial clavicular  fracture on the right. Based on conventional radiography today the patient is also thought to have lateral clavicular fracture on the right as well as a lateral acromial fracture. IMPRESSION: 1. Small right apical pneumothorax with fracture of the right second rib medially. Right medial clavicular fracture. Based on today's conventional radiographs the patient is also thought to have a right lateral clavicular fracture as well as other right rib fractures including the right third and fourth ribs. 2. Right frontal scalp laceration.  No acute intracranial findings. 3. Cervical spondylosis and degenerative disc disease with multilevel impingement, but no acute cervical spine fracture. These results were called by telephone at the time of interpretation on 09/07/2015 at 12:12 pm to Dr. Tilden Fossa , who verbally acknowledged these results. Electronically Signed   By: Gaylyn Rong M.D.   On: 09/07/2015 12:15   Mr Brain Wo Contrast  09/06/2015  CLINICAL DATA:  Aphasia, memory loss and confusion for 1.5 years increasing in frequency. Stroke risk factors include atrial fibrillation, hypertension, and CABG. Head injury February 2015. EXAM: MRI HEAD WITHOUT CONTRAST TECHNIQUE: Multiplanar, multiecho pulse sequences of the brain and surrounding structures were obtained without intravenous contrast. COMPARISON:  CT head 12/19/2013. FINDINGS: No evidence for acute infarction, hemorrhage, mass lesion, or extra-axial fluid. Advanced cerebral and cerebellar atrophy. Hydrocephalus ex vacuo. Mild to moderate T2 and FLAIR hyperintensities throughout periventricular and subcortical white matter representing chronic microvascular ischemic  change. Remote LEFT parietal and LEFT temporal cortical and subcortical infarcts. Dolichoectatic but widely patent cerebral vasculature. Cervical spondylosis with suspected spinal stenosis at C3-4 and C4-5. Unremarkable pituitary and cerebellar tonsils. No osseous findings. Extracranial  soft tissues unremarkable. Chronic RIGHT maxillary sinus disease with retention cyst formation. BILATERAL cataract extraction appears uncomplicated. Compared with the prior CT in 2015, progression of atrophy is noted. The chronic areas of cortical infarction were observed on the scan, but there is increasing diffuse cortical volume loss. IMPRESSION: No acute intracranial findings. Advanced atrophy with mild to moderate small vessel disease. Remote LEFT parietal and LEFT temporal infarcts. Cervical spondylosis. Electronically Signed   By: Elsie Stain M.D.   On: 09/06/2015 13:42   Ct Abdomen Pelvis W Contrast  09/07/2015  CLINICAL DATA:  Larey Seat today.  Rib fractures and pneumothorax. EXAM: CT CHEST, ABDOMEN, AND PELVIS WITH CONTRAST TECHNIQUE: Multidetector CT imaging of the chest, abdomen and pelvis was performed following the standard protocol during bolus administration of intravenous contrast. CONTRAST:  80mL OMNIPAQUE IOHEXOL 300 MG/ML  SOLN COMPARISON:  Chest x-ray 09/07/2015 and CT abdomen/ pelvis 01/10/2014 FINDINGS: CT CHEST FINDINGS Mediastinum/Lymph Nodes: Right-sided chest wall edema/ hematoma likely related to right clavicle fracture. No chest wall mass, supraclavicular or axillary adenopathy. The thyroid gland is grossly normal. The major vascular structures are patent. The heart is normal in size for age. No pericardial effusion. Moderate tortuosity, ectasia and calcification of the thoracic aorta but no focal aneurysm or dissection. Three-vessel coronary artery calcifications are noted. Small amount of probable mediastinal hematoma likely from the displace clavicle fracture near the sternoclavicular joint. The pulmonary arteries appear normal. The esophagus is grossly normal. Lungs/Pleura: There is a moderate-sized anterior pneumothorax estimated at 15%. There are also small pleural effusions and bibasilar atelectasis. No pulmonary contusion or worrisome pulmonary lesions. Musculoskeletal:  Displaced clavicle fracture noted at the sternoclavicular joint. The anterior second and third ribs are fractured. There is also a fourth rib fracture laterally. No left-sided rib fractures are identified. The vertebral bodies are intact. No sternal fracture. Sternal wires related to prior bypass surgery. CT ABDOMEN PELVIS FINDINGS Hepatobiliary: No focal hepatic hepatic lesions or intrahepatic biliary dilatation. No acute hepatic injury. The gallbladder is mildly distended. No acute inflammation. No common bile duct dilatation. Pancreas: No mass, inflammation or acute injury. Spleen: Intact.  No acute injury. Adrenals/Urinary Tract: The adrenal glands are normal. There are multiple bilateral renal calculi and renal cortical thinning but no acute renal injury. Stomach/Bowel: The stomach, duodenum, small bowel and colon are grossly normal without oral contrast. Vascular/Lymphatic: Moderate atherosclerotic calcifications involving the aorta and branch vessels but no dissection or focal aneurysm. Fairly significant bilateral renal artery calcifications. Small infrarenal abdominal aortic aneurysm just above the iliac artery bifurcation measuring 3.0 x 3.0 cm. Reproductive: The prostate gland is enlarged. There is significant median lobe hypertrophy projecting well up into the bladder. This appears relatively stable since 2015. Other: Small amount of free pelvic fluid is noted of uncertain significance or etiology. Musculoskeletal: There is a fracture involving the greater trochanter of the left hip. No definite neck or intertrochanteric fracture. There are advanced degenerative changes involving both hips. There is diffuse osteo per [place] and advanced degenerative changes involving the spine. IMPRESSION: 1. Displaced proximal right clavicle fracture near the sternoclavicular joint with moderate surrounding hematoma. 2. Second, third and fourth right rib fractures. 3. Estimated 15% right-sided pneumothorax. No pulmonary  contusions. Small effusions and bibasilar atelectasis. 4. No acute solid organ injury is identified  in the abdomen. There is free pelvic fluid noted in the right pericolic gutter and pelvis of uncertain etiology or significance. Could not exclude a subtle bowel injury. No free air is identified. Recommend close clinical follow-up. 5. Numerous bilateral renal calculi. 6. Advanced atherosclerotic calcifications involving the thoracic and abdominal aortas and branch vessels. 7. Significant median lobe hypertrophy of the prostate gland. 8. Comminuted fracture involving the greater trochanter of the left hip. 9. Left inferior pubic ramus fracture. Electronically Signed   By: Rudie MeyerP.  Gallerani M.D.   On: 09/07/2015 13:15   Dg Chest Port 1 View  09/10/2015  CLINICAL DATA:  Patient with history of right-sided pneumothorax. EXAM: PORTABLE CHEST 1 VIEW COMPARISON:  Chest radiograph 09/09/2015. FINDINGS: Patient is rotated. Stable cardiac and mediastinal contours status post median sternotomy. No large area of pulmonary consolidation. Interval decrease in size of now tiny right apical pneumothorax. No definite pleural effusion. Re- demonstrated right upper lateral rib fractures and right clavicle fractures. IMPRESSION: Interval decrease in size of small right apical pneumothorax. Re- demonstrated medial and distal right clavicle fractures. Re- demonstrated right rib fractures. Electronically Signed   By: Annia Beltrew  Davis M.D.   On: 09/10/2015 09:50   Dg Chest Port 1 View  09/09/2015  CLINICAL DATA:  Pneumothorax.  History of atrial fibrillation EXAM: PORTABLE CHEST 1 VIEW COMPARISON:  September 08, 2015. FINDINGS: The previously noted right apical pneumothorax is unchanged in size and position. No tension component is appreciable. Fractures of the lateral right clavicle and right acromion region are stable. Displaced fracture of the lateral right fourth rib is again noted as well. There is persistent right base opacity  consistent with either atelectasis or pulmonary contusion, stable. Lungs elsewhere clear. Heart is upper normal in size with pulmonary vascular within normal limits. Patient is status post coronary artery bypass grafting. No adenopathy. There is atherosclerotic calcification in the aorta. IMPRESSION: Essentially no change from 1 day prior. Stable pneumothorax on the right without apparent tension component. Atelectasis versus contusion right base remains stable. Several fractures on the right appear stable. No new opacity. No change in cardiac silhouette. Electronically Signed   By: Bretta BangWilliam  Woodruff III M.D.   On: 09/09/2015 08:05   Dg Chest Port 1 View  09/08/2015  CLINICAL DATA:  Pneumothorax EXAM: PORTABLE CHEST 1 VIEW COMPARISON:  09/07/2015 FINDINGS: Grossly unchanged enlarged cardiac silhouette and mediastinal contours post median sternotomy and CABG. Atherosclerotic plaque with a tortuous thoracic aorta. Worsening right medial basilar heterogeneous opacities. No pleural effusion. Interval increase in size of small right sided pneumothorax. No definitive mediastinal shift. No evidence of edema. Unchanged bones including displaced right clavicular, acromion and right lateral rib fractures, incompletely evaluated. IMPRESSION: 1. Interval increase in size of small right-sided pneumothorax. No definite evidence of tension physiology. Continued attention on follow-up is recommended. 2. Worsening right medial basilar heterogeneous opacities favored to represent atelectasis versus contusion. These results will be called to the ordering clinician or representative by the Radiologist Assistant, and communication documented in the PACS or zVision Dashboard. Electronically Signed   By: Simonne ComeJohn  Watts M.D.   On: 09/08/2015 07:31   Dg Hip Unilat With Pelvis 2-3 Views Left  09/07/2015  CLINICAL DATA:  Found down today. Possibly fell some time during the night. EXAM: DG HIP (WITH OR WITHOUT PELVIS) 2-3V LEFT  COMPARISON:  08/06/2015 FINDINGS: Severe left hip joint degenerative changes are again demonstrated. No obvious acute fracture. The right hip is normally located. No definite fracture. The pubic symphysis and  SI joints are intact. No definite pelvic fractures. IMPRESSION: No acute hip or pelvic fractures are identified. Severe left hip joint degenerative changes. Electronically Signed   By: Rudie Meyer M.D.   On: 09/07/2015 14:05   Dg Hip Unilat  With Pelvis 2-3 Views Right  10/04/2015  CLINICAL DATA:  Fall with right hip pain.  Initial encounter. EXAM: DG HIP (WITH OR WITHOUT PELVIS) 2-3V RIGHT COMPARISON:  None. FINDINGS: Acute and comminuted fracture of the right hip is present which appears to be primarily subtrochanteric. Intertrochanteric component also suspected with lucencies extending into the greater and lesser trochanters. The femoral neck appears intact. No evidence of dislocation. No pelvic fracture or diastasis identified. IMPRESSION: Acute fracture of the right hip which appears to be predominantly subtrochanteric in nature. Intertrochanteric component also suspected. Electronically Signed   By: Irish Lack M.D.   On: 10/04/2015 19:15     CBC  Recent Labs Lab 10/04/15 2037 10/05/15 0448  WBC 10.4 10.2  HGB 12.2* 9.8*  HCT 36.8* 29.3*  PLT 274 184  MCV 97.9 96.7  MCH 32.4 32.3  MCHC 33.2 33.4  RDW 13.8 13.9  LYMPHSABS 0.9  --   MONOABS 0.4  --   EOSABS 0.0  --   BASOSABS 0.0  --     Chemistries   Recent Labs Lab 10/04/15 2037 10/05/15 0448  NA 138 142  K 3.9 3.7  CL 103 107  CO2 22 27  GLUCOSE 123* 115*  BUN 20 16  CREATININE 0.97 0.71  CALCIUM 9.1 8.6*  AST  --  21  ALT  --  20  ALKPHOS  --  146*  BILITOT  --  1.3*   ------------------------------------------------------------------------------------------------------------------ CrCl cannot be calculated (Unknown ideal  weight.). ------------------------------------------------------------------------------------------------------------------ No results for input(s): HGBA1C in the last 72 hours. ------------------------------------------------------------------------------------------------------------------ No results for input(s): CHOL, HDL, LDLCALC, TRIG, CHOLHDL, LDLDIRECT in the last 72 hours. ------------------------------------------------------------------------------------------------------------------ No results for input(s): TSH, T4TOTAL, T3FREE, THYROIDAB in the last 72 hours.  Invalid input(s): FREET3 ------------------------------------------------------------------------------------------------------------------ No results for input(s): VITAMINB12, FOLATE, FERRITIN, TIBC, IRON, RETICCTPCT in the last 72 hours.  Coagulation profile  Recent Labs Lab 10/04/15 2037  INR 1.10    No results for input(s): DDIMER in the last 72 hours.  Cardiac Enzymes No results for input(s): CKMB, TROPONINI, MYOGLOBIN in the last 168 hours.  Invalid input(s): CK ------------------------------------------------------------------------------------------------------------------ Invalid input(s): POCBNP     Time Spent in minutes   30 minutes   Rayson Rando M.D on 10/05/2015 at 10:28 AM  Between 7am to 7pm - Pager - [phone]  After 7pm go to [email or web address] - password [email or web address] [facility]  [facility]   Office  [phone]

## 2015-10-05 NOTE — Op Note (Signed)
NAMMarland Kitchen:  Thomos LemonsBECTON, Nikitas                ACCOUNT NO.:  [id number]  MEDICAL RECORD NO.:  [id number]  LOCATION:  6N09C                        FACILITY:  [facility]  PHYSICIAN:  Dyke BrackettW. D. Armaan Pond, M.D.    DATE OF BIRTH:  [birth year]  DATE OF PROCEDURE:  10/05/2015 DATE OF DISCHARGE:                              OPERATIVE REPORT   PREOPERATIVE DIAGNOSIS:  Displaced intertrochanteric hip fracture, right.  POSTOPERATIVE DIAGNOSIS:  Displaced intertrochanteric hip fracture, right.  OPERATION:  A reductional fixation with [employer]us [employer] intertrochanteric nail (380 mm nail with an 11 mm diameter, 130 degrees angle).  There was a 105 mm lag screw.  SURGEON:  Dyke BrackettW. D. Carylon Tamburro, M.D.  ASSISTANT:  Margart SicklesJoshua Chadwell, PA-C.  ANESTHESIA:  General.  BLOOD LOSS:  Approximately 200 mL.  DESCRIPTION OF PROCEDURE:  Fracture was reduced in supine position with adduction and traction.  His fracture was reduced.  We did a small incision over the greater trochanteric with exposure through the splitting the gluteus medius, we placed a guide pin passed the fracture site.  Using the awl, then we confirmed this position in the AP and lateral plane on the hip film.  We then reamed with the reamer to accept the troch nail within.  Then the proximal femur did not ream distally, then passed the nail without difficulty, confirmed its position, AP and lateral plane, then passed the lag screw into the nearly anatomic center of the head without difficulty, confirmed good positioning.  We compressed the fracture at the fracture site as well as setting the screw for rotational stability.  The stab wound on the trochanteric area and for the portion of the ZAC screw was closed with Vicryl and skin clips.  He was taken to recovery room in stable condition.     Dyke BrackettW. D. Aireal Slater, M.D.     WDC/MEDQ  D:  10/05/2015  T:  10/05/2015  Job:  [id number]

## 2015-10-05 NOTE — Progress Notes (Signed)
Hearing aids taken home with family

## 2015-10-05 NOTE — ED Notes (Signed)
hospitalist at bedside

## 2015-10-05 NOTE — Anesthesia Preprocedure Evaluation (Addendum)
Anesthesia Evaluation  Patient identified by MRN, date of birth, ID band Patient awake    Reviewed: Allergy & Precautions, NPO status , Patient's Chart, lab work & pertinent test results  Airway Mallampati: II  TM Distance: >3 FB Neck ROM: Full    Dental  (+) Partial Upper, Poor Dentition, Dental Advisory Given   Pulmonary neg pulmonary ROS, asthma ,    breath sounds clear to auscultation       Cardiovascular hypertension, + CAD   Rhythm:Regular Rate:Normal     Neuro/Psych PSYCHIATRIC DISORDERS Depression CVA    GI/Hepatic Neg liver ROS, GERD  Medicated,  Endo/Other  negative endocrine ROS  Renal/GU negative Renal ROS  negative genitourinary   Musculoskeletal  (+) Arthritis ,   Abdominal   Peds negative pediatric ROS (+)  Hematology negative hematology ROS (+)   Anesthesia Other Findings   Reproductive/Obstetrics negative OB ROS                            Lab Results  Component Value Date   WBC 10.2 10/05/2015   HGB 9.8* 10/05/2015   HCT 29.3* 10/05/2015   MCV 96.7 10/05/2015   PLT 184 10/05/2015   Lab Results  Component Value Date   CREATININE 0.71 10/05/2015   BUN 16 10/05/2015   NA 142 10/05/2015   K 3.7 10/05/2015   CL 107 10/05/2015   CO2 27 10/05/2015   Lab Results  Component Value Date   INR 1.10 10/04/2015   INR 1.12 09/08/2015   INR 1.16 09/07/2015   09/2015 EKG: normal sinus rhythm.  08/2015: Echo - Left ventricle: The cavity size was normal. Wall thickness was increased in a pattern of moderate LVH. Systolic function was normal. Wall motion was normal; there were no regional wall motion abnormalities. - Aortic valve: Trileaflet; mildly thickened, mildly calcified leaflets. Cusp separation was mildly reduced. Transvalvular velocity was increased. There was mild stenosis. There was mild regurgitation. Valve area (VTI): 1.54 cm^2. Valve area  (Vmax): 1.41 cm^2. Valve area (Vmean): 1.42 cm^2. - Left atrium: The atrium was moderately dilated. - Right ventricle: The cavity size was mildly dilated. Wall thickness was normal.  Anesthesia Physical Anesthesia Plan  ASA: III  Anesthesia Plan: General   Post-op Pain Management:    Induction: Intravenous  Airway Management Planned: Oral ETT  Additional Equipment:   Intra-op Plan:   Post-operative Plan: Extubation in OR  Informed Consent: I have reviewed the patients History and Physical, chart, labs and discussed the procedure including the risks, benefits and alternatives for the proposed anesthesia with the patient or authorized representative who has indicated his/her understanding and acceptance.   Dental advisory given  Plan Discussed with: CRNA  Anesthesia Plan Comments:         Anesthesia Quick Evaluation

## 2015-10-05 NOTE — Anesthesia Procedure Notes (Signed)
Procedure Name: Intubation Date/Time: 10/05/2015 2:02 PM Performed by: Salomon MastWALL, Eupha Lobb COREY Pre-anesthesia Checklist: Patient identified, Emergency Drugs available, Suction available, Patient being monitored and Timeout performed Patient Re-evaluated:Patient Re-evaluated prior to inductionOxygen Delivery Method: Circle system utilized Preoxygenation: Pre-oxygenation with 100% oxygen Intubation Type: IV induction Ventilation: Mask ventilation without difficulty Laryngoscope Size: Mac and 3 Grade View: Grade I Tube type: Oral Tube size: 7.0 mm Number of attempts: 1 Airway Equipment and Method: Stylet Placement Confirmation: ETT inserted through vocal cords under direct vision,  positive ETCO2 and breath sounds checked- equal and bilateral Secured at: 24 cm Tube secured with: Tape

## 2015-10-05 NOTE — ED Notes (Signed)
Pt's neuro checks were completed by asking the pt to open his eyes and by assessing PERRLA and pt's ability to follow commands (looking at RN and opening eyes). Per pt's family this is his baseline

## 2015-10-05 NOTE — ED Notes (Signed)
[facility] called, they will pick up pt at 1015 to take to short stay. Pt having surgery at 1300 today.

## 2015-10-05 NOTE — ED Notes (Signed)
Ortho PA at bedside.  

## 2015-10-05 NOTE — ED Notes (Signed)
[facility] at bedside 

## 2015-10-05 NOTE — ED Notes (Signed)
FAMILY VOICED CONCERNS ABOUT THE PT BEING HELD IN THE ER UNTIL THERE IS A BED AVAILABLE AT [place]. OFFERED TO GET THE PT A HOSPITAL BED FOR COMFORT, BUT THE FAMILY WANTS THE PT TO BE ADMITTED TO OUR FACILITY UNTIL A BED BECOMES AVAILABLE. CHARGE NURSE MADE AWARE.

## 2015-10-05 NOTE — Progress Notes (Addendum)
9:15a- CSW met with patient at bedside. Daughter and brother-in-law were both present during this encounter. Daughter stated she is patient's family support, as his siblings are all deceased. She stated patient was in for a fall, and as a result, patient now has a broken hip. She stated patient has fallen at least three to four times within the past six months. She stated patient had a significant fall in December and was seen at [facility]. She stated patient has been in rehab from the fall in December.   Daughter stated patient was from [place], previously [facility]. Daughter stated patient has been a resident at [facility] since March 2015 where he has an apartment in independent living. She stated while patient was in independent living, he did not need assistance with ADL's. She stated when patient was in the [place], he did have to have assistance with ADL's. She stated patient has a power wheel chair that he utilized around his apartment.  Daughter stated patient will go back to [facility] into the [place]. She stated she has not informed patient of this information at the time so he is not aware that he will be placed in the [place] part of [place]. She stated she will inform patient and address patient going into the [place] at [facility] with patient's PCP. She stated they will take care of patient's placement process in SNF at [facility] and no assistance was needed from [place].  No further questions noted at this time.  Sherri Self, daughter and POA [phone], Self, son-in-law   Merry Proud, [place] ED CSW 10/05/2015 10:30 AM

## 2015-10-05 NOTE — Progress Notes (Signed)
Admission assessment conducted with information provided by pt's daughters. Pt denies pain at this time. Vs within stable limits

## 2015-10-05 NOTE — Progress Notes (Signed)
Pt admitted from pacu s/p IM to right femoral this pm. Pt still heavily sedated. Opens eyes to voice briefly and drifts back to sleep. Unable to complete admission required documents at this time. No signs of pain noted

## 2015-10-05 NOTE — Progress Notes (Signed)
Pt also has a set of upper partial teeth. No aids on pt at this time but in pt's room

## 2015-10-05 NOTE — ED Notes (Signed)
CHARGE NURSE AT THE BEDSIDE.

## 2015-10-05 NOTE — Progress Notes (Signed)
OT Cancellation Note  Patient Details Name: Kristopher [name] MRN: [id number] DOB: 1931-12-23   Cancelled Treatment:    Reason Eval/Treat Not Completed: Other (comment).  Pt is for sx today.  Please reorder. Thanks.  [name] 10/05/2015, 7:18 AM  [name], OTR/L [phone] 10/05/2015

## 2015-10-05 NOTE — Progress Notes (Signed)
Patient ID: Kristopher [name], male   DOB: [birth year], 82 y.o.   MRN: [id number]  Appreciate care from internal med team.  Questioning post op anticoagulation.  Patient with subdural hematomas also hx of GI bleed requiring 2 different hospitalizations.  Has been taken off coumadin and eliquis in the past per daughter.  Would you recommend aspirin use or do you feel mechanical prophylaxis is only option. (SCDs and Ted hose ordered)

## 2015-10-05 NOTE — ED Notes (Signed)
Nurse drawing labs. 

## 2015-10-05 NOTE — Consult Note (Signed)
Reason for Consult:right hip fracture  Referring Physician: Dirk Dress ED  Kristopher [name] is an 82 y.o. male.  HPI:  82 y.o. male with PMH of dementia, frequent fall, multiple bone fracture in the past, CAD, S/P CABG, atrial fibrillation not on anticoagulants, BPH, stroke, GI bleeding, pneumothorax, GERD, gout, depression, hypertension, who presents with right hip pain after fall.  Per patient's daughter, pt was in rehab for broken L hip. He slipped from a recliner, landing on R hip at about 5:30 PM. No loc or head trauma. Patient developed severe pain over right hip. No numbness or tingling sensations in his right leg. Patient was recently treated with antibiotics for bronchitis per his daughter. Currently no cough, chest pain, shortness of breath, fever or chills. Patient does not have abdominal pain, diarrhea, symptoms of UTI or unilateral weakness.  In ED, patient was found to have WBC 10.4, temperature normal, no tachycardia, electrolytes and renal function okay, negative chest x-ray for acute abnormalities. CT of C-spine and CT of the head showed intervalsubdural hemorrhages without significant mass effect, no evidence of acute cervical spine fracture, poor healing of displaced medial right clavicular fracture since prior CT, resolution of previously visualized right apical pneumothorax. X ray of right hip/pelvis showed acute fracture of the right hip which appears to be predominantly subtrochanteric in nature. Intertrochanteric component also suspected. Patient is admitted to inpatient for further evaluation and treatment. Orthopedic surgeon and neurosurgeon were consulted by EDP   Past Medical History  Diagnosis Date  . Atrial fibrillation ([place])   . Hx of CABG   . Hypertension   . DJD (degenerative joint disease), cervical   . Colon polyps   . Gout   . BPH (benign prostatic hyperplasia)   . Stroke [facility])     "light stroke" per daughter  . Fall     Past Surgical History  Procedure Laterality  Date  . Coronary artery bypass graft    . Appendectomy    . Tonsillectomy    . Colonoscopy    . Upper gastrointestinal endoscopy    . Esophagogastroduodenoscopy N/A 12/20/2013    Procedure: ESOPHAGOGASTRODUODENOSCOPY (EGD);  Surgeon: Gatha Mayer, MD;  Location: [facility] ENDOSCOPY;  Service: Endoscopy;  Laterality: N/A;  might be bedside  . Colonoscopy N/A 12/22/2013    Procedure: COLONOSCOPY;  Surgeon: Milus Banister, MD;  Location: [place];  Service: Endoscopy;  Laterality: N/A;  . Givens capsule study N/A 12/22/2013    Procedure: GIVENS CAPSULE STUDY;  Surgeon: Milus Banister, MD;  Location: [place];  Service: Endoscopy;  Laterality: N/A;  . Eye muscle surgery      History reviewed. No pertinent family history.  Social History:  reports that he has never smoked. He has never used smokeless tobacco. He reports that he does not drink alcohol or use illicit drugs.  Allergies:  Allergies  Allergen Reactions  . Codeine     unknown  . Other Other (See Comments)    All pain medications cause a hangover effect  . Sulfa Antibiotics     unknown    Medications: I have reviewed the patient's current medications.  Results for orders placed or performed during the hospital encounter of 10/04/15 (from the past 48 hour(s))  Basic metabolic panel     Status: Abnormal   Collection Time: 10/04/15  8:37 PM  Result Value Ref Range   Sodium 138 135 - 145 mmol/L   Potassium 3.9 3.5 - 5.1 mmol/L   Chloride 103 101 - 111  mmol/L   CO2 22 22 - 32 mmol/L   Glucose, Bld 123 (H) 65 - 99 mg/dL   BUN 20 6 - 20 mg/dL   Creatinine, Ser 0.97 0.61 - 1.24 mg/dL   Calcium 9.1 8.9 - 10.3 mg/dL   GFR calc non Af Amer >60 >60 mL/min   GFR calc Af Amer >60 >60 mL/min    Comment: (NOTE) The eGFR has been calculated using the CKD EPI equation. This calculation has not been validated in all clinical situations. eGFR's persistently <60 mL/min signify possible Chronic Kidney Disease.    Anion gap 13 5 -  15  CBC WITH DIFFERENTIAL     Status: Abnormal   Collection Time: 10/04/15  8:37 PM  Result Value Ref Range   WBC 10.4 4.0 - 10.5 K/uL   RBC 3.76 (L) 4.22 - 5.81 MIL/uL   Hemoglobin 12.2 (L) 13.0 - 17.0 g/dL   HCT 36.8 (L) 39.0 - 52.0 %   MCV 97.9 78.0 - 100.0 fL   MCH 32.4 26.0 - 34.0 pg   MCHC 33.2 30.0 - 36.0 g/dL   RDW 13.8 11.5 - 15.5 %   Platelets 274 150 - 400 K/uL   Neutrophils Relative % 87 %   Neutro Abs 9.0 (H) 1.7 - 7.7 K/uL   Lymphocytes Relative 9 %   Lymphs Abs 0.9 0.7 - 4.0 K/uL   Monocytes Relative 4 %   Monocytes Absolute 0.4 0.1 - 1.0 K/uL   Eosinophils Relative 0 %   Eosinophils Absolute 0.0 0.0 - 0.7 K/uL   Basophils Relative 0 %   Basophils Absolute 0.0 0.0 - 0.1 K/uL  Protime-INR     Status: None   Collection Time: 10/04/15  8:37 PM  Result Value Ref Range   Prothrombin Time 14.4 11.6 - 15.2 seconds   INR 1.10 0.00 - 1.49  Type and screen [place]     Status: None   Collection Time: 10/04/15  8:37 PM  Result Value Ref Range   ABO/RH(D) O POS    Antibody Screen NEG    Sample Expiration 10/07/2015   ABO/Rh     Status: None   Collection Time: 10/04/15  9:00 PM  Result Value Ref Range   ABO/RH(D) O POS   APTT     Status: None   Collection Time: 10/04/15 11:23 PM  Result Value Ref Range   aPTT 32 24 - 37 seconds  Comprehensive metabolic panel     Status: Abnormal   Collection Time: 10/05/15  4:48 AM  Result Value Ref Range   Sodium 142 135 - 145 mmol/L   Potassium 3.7 3.5 - 5.1 mmol/L   Chloride 107 101 - 111 mmol/L   CO2 27 22 - 32 mmol/L   Glucose, Bld 115 (H) 65 - 99 mg/dL   BUN 16 6 - 20 mg/dL   Creatinine, Ser 0.71 0.61 - 1.24 mg/dL   Calcium 8.6 (L) 8.9 - 10.3 mg/dL   Total Protein 5.6 (L) 6.5 - 8.1 g/dL   Albumin 3.3 (L) 3.5 - 5.0 g/dL   AST 21 15 - 41 U/L   ALT 20 17 - 63 U/L   Alkaline Phosphatase 146 (H) 38 - 126 U/L   Total Bilirubin 1.3 (H) 0.3 - 1.2 mg/dL   GFR calc non Af Amer >60 >60 mL/min   GFR calc  Af Amer >60 >60 mL/min    Comment: (NOTE) The eGFR has been calculated using the CKD EPI equation.  This calculation has not been validated in all clinical situations. eGFR's persistently <60 mL/min signify possible Chronic Kidney Disease.    Anion gap 8 5 - 15  CBC     Status: Abnormal   Collection Time: 10/05/15  4:48 AM  Result Value Ref Range   WBC 10.2 4.0 - 10.5 K/uL   RBC 3.03 (L) 4.22 - 5.81 MIL/uL   Hemoglobin 9.8 (L) 13.0 - 17.0 g/dL   HCT 29.3 (L) 39.0 - 52.0 %   MCV 96.7 78.0 - 100.0 fL   MCH 32.3 26.0 - 34.0 pg   MCHC 33.4 30.0 - 36.0 g/dL   RDW 13.9 11.5 - 15.5 %   Platelets 184 150 - 400 K/uL  CBG monitoring, ED     Status: Abnormal   Collection Time: 10/05/15  7:38 AM  Result Value Ref Range   Glucose-Capillary 100 (H) 65 - 99 mg/dL    Dg Chest 1 View  10/04/2015  CLINICAL DATA:  Fall.  Right hip fracture. EXAM: CHEST 1 VIEW COMPARISON:  09/10/2015 FINDINGS: Previous median sternotomy and CABG procedure. Aortic atherosclerosis noted. The heart size and mediastinal contours are within normal limits. Both lungs are clear. The visualized skeletal structures are unremarkable. IMPRESSION: No active disease. Electronically Signed   By: Kerby Moors M.D.   On: 10/04/2015 19:09   Ct Head Wo Contrast  10/04/2015  CLINICAL DATA:  Fall at nursing home with acute right hip fracture. Initial encounter. EXAM: CT HEAD WITHOUT CONTRAST CT CERVICAL SPINE WITHOUT CONTRAST TECHNIQUE: Multidetector CT imaging of the head and cervical spine was performed following the standard protocol without intravenous contrast. Multiplanar CT image reconstructions of the cervical spine were also generated. COMPARISON:  09/07/2015 FINDINGS: CT HEAD FINDINGS Since the prior study, the patient has developed bilateral subdural fluid collections overlying the convexities. The left-sided collection may be slightly larger than the left and measures approximately 12 mm in thickness. There are several foci of  increased density in both subdural collections consistent with more acute age blood. For the most part these subdural collections are lower density and likely consistent with older blood product. No significant mass effect is identified. Stable atrophy and small vessel disease present. No hydrocephalus or acute infarction. No skull fracture identified. CT CERVICAL SPINE FINDINGS The cervical spine shows normal alignment. There is no evidence of acute fracture or subluxation. No soft tissue swelling or hematoma is identified. Stable advanced spondylosis of the cervical spine with near complete loss of disc space height at C4-5, partially fused C5-6 level and moderate disc disease at C3-4 and C6-7. No bony or soft tissue lesions are seen. The visualized airway is normally patent. Previous displaced medial right clavicle fracture shows poor healing since the prior CT with some interval callus formation. Residual deformity is present. Previously noted right apical pneumothorax has resolved. IMPRESSION: 1. Development of bilateral subdural fluid collections overlying the convexities, slightly larger on the left, consistent with interval subdural hemorrhages. There are several foci of higher density in these collections suggestive of more acute aged blood. Subdural collections are not causing significant mass effect. 2. No evidence of acute cervical spine fracture. Stable advanced cervical disc disease. 3. Poor healing of displaced medial right clavicular fracture since prior CT. 4. Resolution of previously visualized right apical pneumothorax. 5. These results were called by telephone at the time of interpretation on 10/04/2015 at 8:20 pm to Dr. Dorie Rank , who verbally acknowledged these results. Electronically Signed   By: Aletta Edouard  M.D.   On: 10/04/2015 20:24   Ct Cervical Spine Wo Contrast  10/04/2015  CLINICAL DATA:  Fall at nursing home with acute right hip fracture. Initial encounter. EXAM: CT HEAD WITHOUT  CONTRAST CT CERVICAL SPINE WITHOUT CONTRAST TECHNIQUE: Multidetector CT imaging of the head and cervical spine was performed following the standard protocol without intravenous contrast. Multiplanar CT image reconstructions of the cervical spine were also generated. COMPARISON:  09/07/2015 FINDINGS: CT HEAD FINDINGS Since the prior study, the patient has developed bilateral subdural fluid collections overlying the convexities. The left-sided collection may be slightly larger than the left and measures approximately 12 mm in thickness. There are several foci of increased density in both subdural collections consistent with more acute age blood. For the most part these subdural collections are lower density and likely consistent with older blood product. No significant mass effect is identified. Stable atrophy and small vessel disease present. No hydrocephalus or acute infarction. No skull fracture identified. CT CERVICAL SPINE FINDINGS The cervical spine shows normal alignment. There is no evidence of acute fracture or subluxation. No soft tissue swelling or hematoma is identified. Stable advanced spondylosis of the cervical spine with near complete loss of disc space height at C4-5, partially fused C5-6 level and moderate disc disease at C3-4 and C6-7. No bony or soft tissue lesions are seen. The visualized airway is normally patent. Previous displaced medial right clavicle fracture shows poor healing since the prior CT with some interval callus formation. Residual deformity is present. Previously noted right apical pneumothorax has resolved. IMPRESSION: 1. Development of bilateral subdural fluid collections overlying the convexities, slightly larger on the left, consistent with interval subdural hemorrhages. There are several foci of higher density in these collections suggestive of more acute aged blood. Subdural collections are not causing significant mass effect. 2. No evidence of acute cervical spine fracture.  Stable advanced cervical disc disease. 3. Poor healing of displaced medial right clavicular fracture since prior CT. 4. Resolution of previously visualized right apical pneumothorax. 5. These results were called by telephone at the time of interpretation on 10/04/2015 at 8:20 pm to Dr. Dorie Rank , who verbally acknowledged these results. Electronically Signed   By: Aletta Edouard M.D.   On: 10/04/2015 20:24   Dg Hip Unilat  With Pelvis 2-3 Views Right  10/04/2015  CLINICAL DATA:  Fall with right hip pain.  Initial encounter. EXAM: DG HIP (WITH OR WITHOUT PELVIS) 2-3V RIGHT COMPARISON:  None. FINDINGS: Acute and comminuted fracture of the right hip is present which appears to be primarily subtrochanteric. Intertrochanteric component also suspected with lucencies extending into the greater and lesser trochanters. The femoral neck appears intact. No evidence of dislocation. No pelvic fracture or diastasis identified. IMPRESSION: Acute fracture of the right hip which appears to be predominantly subtrochanteric in nature. Intertrochanteric component also suspected. Electronically Signed   By: Aletta Edouard M.D.   On: 10/04/2015 19:15    Review of Systems  Unable to perform ROS: dementia   Blood pressure 152/86, pulse 84, temperature 98.8 F (37.1 C), temperature source Oral, resp. rate 20, SpO2 98 %. Physical Exam  Constitutional: No distress.  HENT:  Head: Normocephalic and atraumatic.  Nose: Nose normal.  Eyes: EOM are normal. Pupils are equal, round, and reactive to light.  Neck: Normal range of motion. Neck supple.  Cardiovascular: Normal rate and intact distal pulses.   Respiratory: Effort normal. No respiratory distress.  GI: Soft. He exhibits no distension. There is no tenderness.  Musculoskeletal:  RLE externally rotated and shortened, TTP lateral right hip, DF/PF ankle intact, SILT all dermatomes, no calf TTP  Neurological: He is alert.  Skin: Skin is warm and dry. No rash noted. No  erythema.  Psychiatric: He has a normal mood and affect. His behavior is normal.    Assessment/Plan: Right hip intertrochanteric fracture  Cleared for surgery by Internal med and neurosurg.  Risks and benefits of right hip intramedullary nail discussed with pt and family member and they wish to proceed.  Will be transferred to [facility] hospital for scheduled surgery later today.  Pain control as ordered, NPO.    Chriss Czar 10/05/2015, 8:57 AM

## 2015-10-05 NOTE — Brief Op Note (Signed)
10/04/2015 - 10/05/2015  3:22 PM  PATIENT:  Kristopher CitizenEdwin [name]  82 y.o. male  PRE-OPERATIVE DIAGNOSIS:  Fractured right hip  POST-OPERATIVE DIAGNOSIS:  Fractured right hip  PROCEDURE:  Procedure(s): INTRAMEDULLARY (IM) NAIL FEMORAL (Right)  SURGEON:  Surgeon(s) and Role:    * Frederico Hammananiel Caffrey, MD - Primary  PHYSICIAN ASSISTANT: Margart SicklesJoshua Dyllan Kats, PA-C  ASSISTANTS:    ANESTHESIA:   general  EBL:  Total I/O In: 1000 [I.V.:1000] Out: 300 [Urine:200; Blood:100]  BLOOD ADMINISTERED:  DRAINS: none   LOCAL MEDICATIONS USED:  NONE  SPECIMEN:  No Specimen  DISPOSITION OF SPECIMEN:  N/A  COUNTS:  YES  TOURNIQUET:  * No tourniquets in log *  DICTATION: .Other Dictation: Dictation Number unknown  PLAN OF CARE: Admit to inpatient   PATIENT DISPOSITION:  PACU - hemodynamically stable.   Delay start of Pharmacological VTE agent (>24hrs) due to surgical blood loss or risk of bleeding: yes

## 2015-10-05 NOTE — ED Notes (Signed)
PT PLACED ON A HOSPITAL BED FOR COMFORT.

## 2015-10-06 ENCOUNTER — Inpatient Hospital Stay (HOSPITAL_COMMUNITY): Payer: Medicare Other

## 2015-10-06 ENCOUNTER — Encounter (HOSPITAL_COMMUNITY): Payer: Self-pay | Admitting: Orthopedic Surgery

## 2015-10-06 DIAGNOSIS — I6200 Nontraumatic subdural hemorrhage, unspecified: Secondary | ICD-10-CM

## 2015-10-06 DIAGNOSIS — I10 Essential (primary) hypertension: Secondary | ICD-10-CM

## 2015-10-06 DIAGNOSIS — S72001D Fracture of unspecified part of neck of right femur, subsequent encounter for closed fracture with routine healing: Secondary | ICD-10-CM

## 2015-10-06 DIAGNOSIS — F0280 Dementia in other diseases classified elsewhere without behavioral disturbance: Secondary | ICD-10-CM

## 2015-10-06 DIAGNOSIS — I480 Paroxysmal atrial fibrillation: Secondary | ICD-10-CM

## 2015-10-06 LAB — BASIC METABOLIC PANEL
ANION GAP: 5 (ref 5–15)
BUN: 9 mg/dL (ref 6–20)
CHLORIDE: 108 mmol/L (ref 101–111)
CO2: 28 mmol/L (ref 22–32)
Calcium: 8.4 mg/dL — ABNORMAL LOW (ref 8.9–10.3)
Creatinine, Ser: 0.68 mg/dL (ref 0.61–1.24)
Glucose, Bld: 119 mg/dL — ABNORMAL HIGH (ref 65–99)
POTASSIUM: 3.7 mmol/L (ref 3.5–5.1)
SODIUM: 141 mmol/L (ref 135–145)

## 2015-10-06 LAB — CBC
HCT: 26.1 % — ABNORMAL LOW (ref 39.0–52.0)
HEMOGLOBIN: 8.6 g/dL — AB (ref 13.0–17.0)
MCH: 32 pg (ref 26.0–34.0)
MCHC: 33 g/dL (ref 30.0–36.0)
MCV: 97 fL (ref 78.0–100.0)
PLATELETS: 142 10*3/uL — AB (ref 150–400)
RBC: 2.69 MIL/uL — AB (ref 4.22–5.81)
RDW: 14 % (ref 11.5–15.5)
WBC: 8.6 10*3/uL (ref 4.0–10.5)

## 2015-10-06 MED ORDER — TRAMADOL HCL 50 MG PO TABS: 100.0000 mg | ORAL_TABLET | Freq: Four times a day (QID) | ORAL | Status: DC | PRN

## 2015-10-06 NOTE — Progress Notes (Addendum)
PT Cancellation Note  Patient Details Name: Kristopher [name] MRN: [id number] DOB: [birth year]   Cancelled Treatment:    Reason Eval/Treat Not Completed: Medical issues which prohibited therapy.  Per chart, patient has "strict bedrest" orders.  Please discontinue these orders when appropriate for patient, and PT will initiate evaluation at that time.  Thank you.  Also, noted recent history of fall in December, Lt hip fx - TDWB LLE, and patient was NWB RUE.  Please advise if these precautions remain appropriate.  Thank you!   Vena AustriaDavis, Camauri Fleece H 10/06/2015, 3:25 PM Durenda HurtSusan H. Renaldo Fiddleravis, PT, [facility] Acute [facility] Pager [phone]

## 2015-10-06 NOTE — Care Management Note (Addendum)
Case Management Note  Patient Details  Name: Kristopher [name] MRN: [id number] Date of Birth: 1932-05-14  Subjective/Objective:                    Action/Plan:  Initial UR completed.  [facility] Consult to SW entered    Await PT/OT evals to determine discharge needs. Expected Discharge Date:                  Expected Discharge Plan:     In-House Referral:     Discharge planning Services     Post Acute Care Choice:    Choice offered to:     DME Arranged:    DME Agency:     HH Arranged:    HH Agency:     Status of Service:  In process, will continue to follow  Medicare Important Message Given:    Date Medicare IM Given:    Medicare IM give by:    Date Additional Medicare IM Given:    Additional Medicare Important Message give by:     If discussed at Long Length of Stay Meetings, dates discussed:    Additional Comments:  Kingsley PlanWile, Brahim Dolman Marie, RN 10/06/2015, 7:24 AM

## 2015-10-06 NOTE — Clinical Social Work Note (Signed)
Clinical Social Work Assessment  Patient Details  Name: Kristopher [name] MRN: [id number] Date of Birth: August 29, 1932  Date of referral:  10/06/15               Reason for consult:  Facility Placement                Permission sought to share information with:  Family Supports, Oceanographeracility Contact Representative Permission granted to share information::   (Advanced Directives named daughters as Product managerHCPOA- pt disoriented)  Name::     Database administratorherry  Agency::  [facility]  Relationship::  Paramedicdtr  Contact Information:     Housing/Transportation Living arrangements for the past 2 months:  Skilled Building surveyorursing Facility Source of Information:  Adult Children Patient Interpreter Needed:  None Criminal Activity/Legal Involvement Pertinent to Current Situation/Hospitalization:  No - Comment as needed Significant Relationships:  Adult Children Lives with:    Do you feel safe going back to the place where you live?  Yes Need for family participation in patient care:  Yes (Comment) (decision making)  Care giving concerns:  None- [facility]   Social Worker assessment / plan: CSW discussed plan for pt after DC from hospital with Dtr Kristopher [name]- discussed return to SNF vs other options.  Employment status:  Retired Health and safety inspectornsurance information:    PT Recommendations:  Not assessed at this time Information / Referral to community resources:  Skilled Nursing Facility  Patient/Family's Response to care:  Pt dtr agreeable [facility] when ready for DC- anticipate DC tomorrow  Patient/Family's Understanding of and Emotional Response to Diagnosis, Current Treatment, and Prognosis:  No questions or concerns.  Emotional Assessment Appearance:  Appears stated age Attitude/Demeanor/Rapport:  Unable to Assess Affect (typically observed):  Unable to Assess Orientation:  Fluctuating Orientation (Suspected and/or reported Sundowners) Alcohol / Substance use:  Not Applicable Psych involvement  (Current and /or in the community):  No (Comment)  Discharge Needs  Concerns to be addressed:  Care Coordination Readmission within the last 30 days:  Yes Current discharge risk:  Physical Impairment Barriers to Discharge:  Continued Medical Work up   Peabody EnergyHoloman, Kristopher Berzins M, LCSW 10/06/2015, 11:16 AM

## 2015-10-06 NOTE — Progress Notes (Signed)
PROGRESS NOTE    Kristopher [name] WUJ:[id number] DOB: [birth year] DOA: 10/04/2015 PCP: Ginette Otto, MD  HPI/Brief narrative 82 y.o. male with PMH of dementia, frequent fall, multiple bone fracture in the past, CAD, S/P CABG, atrial fibrillation not on anticoagulants, BPH, stroke, GI bleeding, pneumothorax, GERD, gout, depression, hypertension, who presents with right hip pain after fall. Work up was significant for subdural hematoma (no surgical intervention required by neurosurgery) & right hip fracture. Patient was transferred from [facility] on 1/11 and underwent right hip surgery (IM nail fixation).   Assessment/Plan:   Closed fracture of right hip (HCC) - S/p IM nail  - SCD for DVT prophylaxis given subdural hematoma. - Discussed with neurosurgery: Recommend obtaining a CT head without contrast to evaluate for acute part of the subdural hematoma and if it's improved, may recommend aspirin up to a max of 325 MG daily.  SDH (subdural hematoma) (HCC):  - has bilateral subacute/chronic SDH with miniscule areas of superimposed acute hemorrhage, - neurosurgery consulted by ED. Dr. Conchita Paris, no need for surgery. Pt can be followed up in his office in about 2 weeks for f/u of the SDH [facility] [phone]). - Discussed with Dr. Conchita Paris on 1/12: Recommendations as above. Will repeat CT head.  CAD - s/p of CABG: No CP. -no ASA due to SDH  PAF - CHA2DS2-VASc Score is 5, not on anticoagulation secondary to multiple falls, GI bleed, and currently has subdural hematoma  -continue cardizem ,NSR  Hypertension - Blood pressure acceptable, continue with Cardizem  Severe protein calorie malnutrition - Continue with ensure  Dementia - Continue with Donepezil. As per daughter at bedside, may be more confused than usual.  BPH - Continue Flomax and proscar  Gout:  - continue Aloopurinol  Depression - Continue Zoloft  Acute blood loss anemia and  mild thrombocytopenia - Secondary to postoperative blood loss. Follow CBC in a.m. Transfuse if hemoglobin <7 g per DL.    DVT prophylaxis: SCDs for now. Code Status: DO NOT RESUSCITATE Family Communication: Discussed extensively with patient's daughter Ms. Sherrie Self at bedside. Disposition Plan: DC to SNF when medically stable.   Consultants:  Orthopedics  Discussed with neurosurgery  Procedures:  Right hip surgery with IM nail fixation on 1/11  Antimicrobials:  None  Subjective: Pleasantly confused and unable to provide any history. As per daughter, slightly more confused than his usual. As per RN, no acute events reported.  Objective: Filed Vitals:   10/05/15 2145 10/06/15 0122 10/06/15 0503 10/06/15 1436  BP: 125/57 128/65 144/66 128/63  Pulse: 73 73 94 73  Temp: 97.9 F (36.6 C) 98.3 F (36.8 C) 99.5 F (37.5 C) 98.3 F (36.8 C)  TempSrc: Axillary Axillary Oral Oral  Resp: 18 16 18 18   Weight:      SpO2: 100% 100% 99% 99%    Intake/Output Summary (Last 24 hours) at 10/06/15 1527 Last data filed at 10/06/15 1515  Gross per 24 hour  Intake 4618.2 ml  Output   1425 ml  Net 3193.2 ml   Filed Weights   10/05/15 2030  Weight: 51.256 kg (113 lb)    Exam:  General exam: elderly frail chronically ill-looking male lying comfortably propped up in bed. Bilateral hand mittens and bedrails. Respiratory system: Clear/ poor inspiratory effort. . No increased work of breathing. Cardiovascular system: S1 & S2 heard, RRR. No JVD, murmurs, gallops, clicks or pedal edema. Gastrointestinal system: Abdomen is nondistended, soft and nontender. Normal bowel sounds heard.  Condom cath.  Central nervous system: Alert and oriented. No focal neurological deficits. Extremities: Symmetric 5 x 5 power.   Data Reviewed: Basic Metabolic Panel:  Recent Labs Lab 10/04/15 2037 10/05/15 0448 10/06/15 0444  NA 138 142 141  K 3.9 3.7 3.7  CL 103 107 108  CO2 22 27 28     GLUCOSE 123* 115* 119*  BUN 20 16 9   CREATININE 0.97 0.71 0.68  CALCIUM 9.1 8.6* 8.4*   Liver Function Tests:  Recent Labs Lab 10/05/15 0448  AST 21  ALT 20  ALKPHOS 146*  BILITOT 1.3*  PROT 5.6*  ALBUMIN 3.3*   No results for input(s): LIPASE, AMYLASE in the last 168 hours. No results for input(s): AMMONIA in the last 168 hours. CBC:  Recent Labs Lab 10/04/15 2037 10/05/15 0448 10/06/15 0444  WBC 10.4 10.2 8.6  NEUTROABS 9.0*  --   --   HGB 12.2* 9.8* 8.6*  HCT 36.8* 29.3* 26.1*  MCV 97.9 96.7 97.0  PLT 274 184 142*   Cardiac Enzymes: No results for input(s): CKTOTAL, CKMB, CKMBINDEX, TROPONINI in the last 168 hours. BNP (last 3 results) No results for input(s): PROBNP in the last 8760 hours. CBG:  Recent Labs Lab 10/05/15 0738  GLUCAP 100*    No results found for this or any previous visit (from the past 240 hour(s)).       Studies: Dg Chest 1 View  10/04/2015  CLINICAL DATA:  Fall.  Right hip fracture. EXAM: CHEST 1 VIEW COMPARISON:  09/10/2015 FINDINGS: Previous median sternotomy and CABG procedure. Aortic atherosclerosis noted. The heart size and mediastinal contours are within normal limits. Both lungs are clear. The visualized skeletal structures are unremarkable. IMPRESSION: No active disease. Electronically Signed   By: Signa Kell M.D.   On: 10/04/2015 19:09   Ct Head Wo Contrast  10/04/2015  CLINICAL DATA:  Fall at nursing home with acute right hip fracture. Initial encounter. EXAM: CT HEAD WITHOUT CONTRAST CT CERVICAL SPINE WITHOUT CONTRAST TECHNIQUE: Multidetector CT imaging of the head and cervical spine was performed following the standard protocol without intravenous contrast. Multiplanar CT image reconstructions of the cervical spine were also generated. COMPARISON:  09/07/2015 FINDINGS: CT HEAD FINDINGS Since the prior study, the patient has developed bilateral subdural fluid collections overlying the convexities. The left-sided  collection may be slightly larger than the left and measures approximately 12 mm in thickness. There are several foci of increased density in both subdural collections consistent with more acute age blood. For the most part these subdural collections are lower density and likely consistent with older blood product. No significant mass effect is identified. Stable atrophy and small vessel disease present. No hydrocephalus or acute infarction. No skull fracture identified. CT CERVICAL SPINE FINDINGS The cervical spine shows normal alignment. There is no evidence of acute fracture or subluxation. No soft tissue swelling or hematoma is identified. Stable advanced spondylosis of the cervical spine with near complete loss of disc space height at C4-5, partially fused C5-6 level and moderate disc disease at C3-4 and C6-7. No bony or soft tissue lesions are seen. The visualized airway is normally patent. Previous displaced medial right clavicle fracture shows poor healing since the prior CT with some interval callus formation. Residual deformity is present. Previously noted right apical pneumothorax has resolved. IMPRESSION: 1. Development of bilateral subdural fluid collections overlying the convexities, slightly larger on the left, consistent with interval subdural hemorrhages. There are several foci of higher density in these collections suggestive  of more acute aged blood. Subdural collections are not causing significant mass effect. 2. No evidence of acute cervical spine fracture. Stable advanced cervical disc disease. 3. Poor healing of displaced medial right clavicular fracture since prior CT. 4. Resolution of previously visualized right apical pneumothorax. 5. These results were called by telephone at the time of interpretation on 10/04/2015 at 8:20 pm to Dr. Linwood DibblesJON KNAPP , who verbally acknowledged these results. Electronically Signed   By: Irish LackGlenn  Yamagata M.D.   On: 10/04/2015 20:24   Ct Cervical Spine Wo  Contrast  10/04/2015  CLINICAL DATA:  Fall at nursing home with acute right hip fracture. Initial encounter. EXAM: CT HEAD WITHOUT CONTRAST CT CERVICAL SPINE WITHOUT CONTRAST TECHNIQUE: Multidetector CT imaging of the head and cervical spine was performed following the standard protocol without intravenous contrast. Multiplanar CT image reconstructions of the cervical spine were also generated. COMPARISON:  09/07/2015 FINDINGS: CT HEAD FINDINGS Since the prior study, the patient has developed bilateral subdural fluid collections overlying the convexities. The left-sided collection may be slightly larger than the left and measures approximately 12 mm in thickness. There are several foci of increased density in both subdural collections consistent with more acute age blood. For the most part these subdural collections are lower density and likely consistent with older blood product. No significant mass effect is identified. Stable atrophy and small vessel disease present. No hydrocephalus or acute infarction. No skull fracture identified. CT CERVICAL SPINE FINDINGS The cervical spine shows normal alignment. There is no evidence of acute fracture or subluxation. No soft tissue swelling or hematoma is identified. Stable advanced spondylosis of the cervical spine with near complete loss of disc space height at C4-5, partially fused C5-6 level and moderate disc disease at C3-4 and C6-7. No bony or soft tissue lesions are seen. The visualized airway is normally patent. Previous displaced medial right clavicle fracture shows poor healing since the prior CT with some interval callus formation. Residual deformity is present. Previously noted right apical pneumothorax has resolved. IMPRESSION: 1. Development of bilateral subdural fluid collections overlying the convexities, slightly larger on the left, consistent with interval subdural hemorrhages. There are several foci of higher density in these collections suggestive of  more acute aged blood. Subdural collections are not causing significant mass effect. 2. No evidence of acute cervical spine fracture. Stable advanced cervical disc disease. 3. Poor healing of displaced medial right clavicular fracture since prior CT. 4. Resolution of previously visualized right apical pneumothorax. 5. These results were called by telephone at the time of interpretation on 10/04/2015 at 8:20 pm to Dr. Linwood DibblesJON KNAPP , who verbally acknowledged these results. Electronically Signed   By: Irish LackGlenn  Yamagata M.D.   On: 10/04/2015 20:24   Dg Hip Operative Unilat With Pelvis Right  10/05/2015  CLINICAL DATA:  Status post ORIF of right hip fracture. EXAM: OPERATIVE right HIP (WITH PELVIS IF PERFORMED) 2 VIEWS TECHNIQUE: Fluoroscopic spot image(s) were submitted for interpretation post-operatively. COMPARISON:  10/04/2015 FINDINGS: Intra medullary rod and screw device is identified reducing the comminuted intertrochanteric fracture of the proximal right femur. The distal and of the medullary rod is not visualized. The hardware components are in anatomic alignment. IMPRESSION: Status post ORIF of proximal right femur fracture. Electronically Signed   By: Signa Kellaylor  Stroud M.D.   On: 10/05/2015 15:26   Dg Hip Unilat  With Pelvis 2-3 Views Right  10/04/2015  CLINICAL DATA:  Fall with right hip pain.  Initial encounter. EXAM: DG HIP (WITH OR WITHOUT  PELVIS) 2-3V RIGHT COMPARISON:  None. FINDINGS: Acute and comminuted fracture of the right hip is present which appears to be primarily subtrochanteric. Intertrochanteric component also suspected with lucencies extending into the greater and lesser trochanters. The femoral neck appears intact. No evidence of dislocation. No pelvic fracture or diastasis identified. IMPRESSION: Acute fracture of the right hip which appears to be predominantly subtrochanteric in nature. Intertrochanteric component also suspected. Electronically Signed   By: Irish Lack M.D.   On:  10/04/2015 19:15        Scheduled Meds: . allopurinol  300 mg Oral Daily  . diltiazem  120 mg Oral Daily  . docusate sodium  100 mg Oral BID  . donepezil  5 mg Oral QHS  . feeding supplement (ENSURE ENLIVE)  237 mL Oral TID BM  . finasteride  5 mg Oral Daily  . montelukast  10 mg Oral Daily  . pantoprazole  40 mg Oral BID  . sertraline  50 mg Oral Daily  . sodium chloride  3 mL Intravenous Q12H  . tamsulosin  0.4 mg Oral Daily  . traMADol  100 mg Oral 4 times per day   Continuous Infusions: . sodium chloride Stopped (10/05/15 1020)  . sodium chloride 75 mL/hr at 10/06/15 0616  . lactated ringers 10 mL/hr at 10/05/15 1131    Principal Problem:   Closed fracture of right hip (HCC) Active Problems:   History of coronary artery bypass graft   History of GI bleed   Paroxysmal atrial fibrillation (HCC)   Personal history of arterial venous malformation (AVM)   Hip fracture (HCC)   Fall   Essential hypertension   Anterior displaced fracture of proximal clavicle with delayed healing   Dementia due to general medical condition without behavioral disturbance   Protein-calorie malnutrition, severe (HCC)   Pneumothorax on right   SDH (subdural hematoma) (HCC)   BPH (benign prostatic hyperplasia)   GERD (gastroesophageal reflux disease)   Gout   Depression    Time spent: 40 minutes.    Marcellus Scott, MD, FACP, FHM. [facility] Pager [phone]  If 7PM-7AM, please contact night-coverage [email or web address] Password [email or web address] 10/06/2015, 3:27 PM    LOS: 2 days

## 2015-10-06 NOTE — NC FL2 (Signed)
[place] MEDICAID FL2 LEVEL OF CARE SCREENING TOOL     IDENTIFICATION  Patient Name: Kristopher [name] Birthdate: 1931/12/18 Sex: male Admission Date (Current Location): 10/04/2015  [facility] [place]   Facility and Address:  [facility], [address], [place], [place] [address]      Provider Number: [id number]  Attending Physician Name and Address:  Elease EtienneAnand D Hongalgi, MD  Relative Name and Phone Number:  [name]    Current Level of Care: Hospital Recommended Level of Care: Skilled Nursing Facility Prior Approval Number:    Date Approved/Denied:   PASRR Number:    Discharge Plan: SNF    Current Diagnoses: Patient Active Problem List   Diagnosis Date Noted  . BPH (benign prostatic hyperplasia) 10/05/2015  . GERD (gastroesophageal reflux disease) 10/05/2015  . Gout 10/05/2015  . Depression 10/05/2015  . Closed fracture of right hip (HCC) 10/04/2015  . SDH (subdural hematoma) (HCC) 10/04/2015  . Dementia due to general medical condition without behavioral disturbance 09/12/2015  . Protein-calorie malnutrition, severe (HCC) 09/12/2015  . Pneumothorax on right   . Fracture of pubis (HCC)   . Anterior displaced fracture of proximal clavicle with delayed healing   . Pneumothorax 09/07/2015  . Rib fracture 09/07/2015  . Hip fracture (HCC) 09/07/2015  . Pelvic fracture (HCC) 09/07/2015  . Anemia 09/07/2015  . Syncope and collapse 09/07/2015  . Fall 09/07/2015  . Hypertension   . Clavicle fracture   . Essential hypertension   . CN (constipation) 03/25/2015  . Personal history of arterial venous malformation (AVM) 03/25/2015  . Osteoarthritis 12/13/2014  . Paroxysmal atrial fibrillation (HCC) 12/13/2014  . Systolic murmur 12/13/2014  . Atherosclerosis of native coronary artery of native heart without angina pectoris 05/11/2014  . History of coronary artery bypass graft 05/11/2014  . History of GI bleed 05/11/2014  . Left leg  pain 05/11/2014    Orientation RESPIRATION BLADDER Height & Weight     (DOx4)  O2 ([place] 2L) Continent 5\' 8"  (172.7 cm) 113 lbs.  BEHAVIORAL SYMPTOMS/MOOD NEUROLOGICAL BOWEL NUTRITION STATUS      Incontinent Diet (see DC summary)  AMBULATORY STATUS COMMUNICATION OF NEEDS Skin   Extensive Assist Verbally Surgical wounds                       Personal Care Assistance Level of Assistance  Bathing, Dressing Bathing Assistance: Maximum assistance   Dressing Assistance: Maximum assistance     Functional Limitations Info  Sight, Hearing Sight Info: Impaired Hearing Info: Impaired      SPECIAL CARE FACTORS FREQUENCY  PT (By licensed PT), OT (By licensed OT)     PT Frequency: 5/wk OT Frequency: 5/wk            Contractures      Additional Factors Info  Code Status, Allergies, Psychotropic Code Status Info: DNR Allergies Info: Codeine, Other, Sulfa Antibiotics Psychotropic Info: zoloft         Current Medications (10/06/2015):  This is the current hospital active medication list Current Facility-Administered Medications  Medication Dose Route Frequency Provider Last Rate Last Dose  . 0.9 %  sodium chloride infusion  1,000 mL Intravenous Continuous Lorretta HarpXilin Niu, MD   Stopped at 10/05/15 1020  . 0.9 %  sodium chloride infusion   Intravenous Continuous Margart SicklesJoshua Chadwell, PA-C 75 mL/hr at 10/06/15 0616    . acetaminophen (TYLENOL) tablet 650 mg  650 mg Oral Q6H PRN Lorretta HarpXilin Niu, MD  Or  . acetaminophen (TYLENOL) suppository 650 mg  650 mg Rectal Q6H PRN Lorretta Harp, MD      . albuterol (PROVENTIL) (2.5 MG/3ML) 0.083% nebulizer solution 2.5 mg  2.5 mg Nebulization Q6H PRN Lorretta Harp, MD      . allopurinol (ZYLOPRIM) tablet 300 mg  300 mg Oral Daily Lorretta Harp, MD   300 mg at 10/06/15 1007  . bisacodyl (DULCOLAX) suppository 10 mg  10 mg Rectal Daily PRN Margart Sickles, PA-C      . diltiazem (CARDIZEM CD) 24 hr capsule 120 mg  120 mg Oral Daily Lorretta Harp, MD   120 mg at  10/06/15 1007  . docusate sodium (COLACE) capsule 100 mg  100 mg Oral BID Joshua Chadwell, PA-C   100 mg at 10/06/15 1007  . donepezil (ARICEPT) tablet 5 mg  5 mg Oral QHS Lorretta Harp, MD   5 mg at 10/05/15 0405  . feeding supplement (ENSURE ENLIVE) (ENSURE ENLIVE) liquid 237 mL  237 mL Oral TID BM Lorretta Harp, MD   237 mL at 10/06/15 1008  . finasteride (PROSCAR) tablet 5 mg  5 mg Oral Daily Lorretta Harp, MD   5 mg at 10/06/15 1007  . guaiFENesin (MUCINEX) 12 hr tablet 600 mg  600 mg Oral BID PRN Lorretta Harp, MD      . hydrALAZINE (APRESOLINE) injection 5 mg  5 mg Intravenous Q2H PRN Lorretta Harp, MD      . lactated ringers infusion   Intravenous Continuous Marcene Duos, MD 10 mL/hr at 10/05/15 1131    . menthol-cetylpyridinium (CEPACOL) lozenge 3 mg  1 lozenge Oral PRN Margart Sickles, PA-C       Or  . phenol (CHLORASEPTIC) mouth spray 1 spray  1 spray Mouth/Throat PRN Joshua Chadwell, PA-C      . metoCLOPramide (REGLAN) tablet 5-10 mg  5-10 mg Oral Q8H PRN Joshua Chadwell, PA-C       Or  . metoCLOPramide (REGLAN) injection 5-10 mg  5-10 mg Intravenous Q8H PRN Joshua Chadwell, PA-C      . montelukast (SINGULAIR) tablet 10 mg  10 mg Oral Daily Lorretta Harp, MD   10 mg at 10/06/15 1007  . morphine 2 MG/ML injection 2 mg  2 mg Intravenous Q4H PRN Lorretta Harp, MD   2 mg at 10/05/15 0121  . ondansetron (ZOFRAN) tablet 4 mg  4 mg Oral Q6H PRN Joshua Chadwell, PA-C       Or  . ondansetron (ZOFRAN) injection 4 mg  4 mg Intravenous Q6H PRN Joshua Chadwell, PA-C      . oxyCODONE-acetaminophen (PERCOCET/ROXICET) 5-325 MG per tablet 1 tablet  1 tablet Oral Q4H PRN Lorretta Harp, MD      . pantoprazole (PROTONIX) EC tablet 40 mg  40 mg Oral BID Lorretta Harp, MD   40 mg at 10/06/15 1007  . senna-docusate (Senokot-S) tablet 1 tablet  1 tablet Oral QHS PRN Margart Sickles, PA-C   1 tablet at 10/06/15 1043  . sertraline (ZOLOFT) tablet 50 mg  50 mg Oral Daily Lorretta Harp, MD   50 mg at 10/06/15 1007  . sodium chloride 0.9 %  injection 3 mL  3 mL Intravenous Q12H Lorretta Harp, MD   3 mL at 10/06/15 1008  . sodium phosphate (FLEET) 7-19 GM/118ML enema 1 enema  1 enema Rectal Once PRN Joshua Chadwell, PA-C      . tamsulosin (FLOMAX) capsule 0.4 mg  0.4 mg Oral Daily Lorretta Harp, MD   0.4 mg  at 10/06/15 1007  . traMADol (ULTRAM) tablet 100 mg  100 mg Oral 4 times per day Margart Sickles, PA-C   100 mg at 10/06/15 1043     Discharge Medications: Please see discharge summary for a list of discharge medications.  Relevant Imaging Results:  Relevant Lab Results:   Additional Information SS#: [id number]  Izora Ribas, [place]

## 2015-10-06 NOTE — Progress Notes (Signed)
Subjective: 1 Day Post-Op Procedure(s) (LRB): INTRAMEDULLARY (IM) NAIL FEMORAL (Right) Patient less responsive today than he was yesterday prior to surgery, in wrist restraints and mittens.    Objective: Vital signs in last 24 hours: Temp:  [97.3 F (36.3 C)-99.5 F (37.5 C)] 99.5 F (37.5 C) (01/12 0503) Pulse Rate:  [70-94] 94 (01/12 0503) Resp:  [13-21] 18 (01/12 0503) BP: (92-144)/(47-66) 144/66 mmHg (01/12 0503) SpO2:  [99 %-100 %] 99 % (01/12 0503) FiO2 (%):  [2 %] 2 % (01/11 2145) Weight:  [51.256 kg (113 lb)] 51.256 kg (113 lb) (01/11 2030)  Intake/Output from previous day: 01/11 0701 - 01/12 0700 In: 2600 [P.O.:100; I.V.:2500] Out: 1775 [Urine:1625; Blood:150] Intake/Output this shift: Total I/O In: 2365.7 [P.O.:360; I.V.:1905.7; IV Piggyback:100] Out: -    Recent Labs  10/04/15 2037 10/05/15 0448 10/06/15 0444  HGB 12.2* 9.8* 8.6*    Recent Labs  10/05/15 0448 10/06/15 0444  WBC 10.2 8.6  RBC 3.03* 2.69*  HCT 29.3* 26.1*  PLT 184 142*    Recent Labs  10/05/15 0448 10/06/15 0444  NA 142 141  K 3.7 3.7  CL 107 108  CO2 27 28  BUN 16 9  CREATININE 0.71 0.68  GLUCOSE 115* 119*  CALCIUM 8.6* 8.4*    Recent Labs  10/04/15 2037  INR 1.10    Intact pulses distally Incision: dressing C/D/I  Assessment/Plan: 1 Day Post-Op Procedure(s) (LRB): INTRAMEDULLARY (IM) NAIL FEMORAL (Right) Appreciate med participation in care of this pt. Will discuss further with treatment team if able to do any po anticoagulation for dvt prophylaxis mechanical for now with ted hose and scd's.   Tramadol for pain as needed. May allow wbat but would only recommend this for transfers once able from cognitive standpoint.   Will continue to follow.  Margart SicklesChadwell, Sheddrick Lattanzio 10/06/2015, 12:49 PM

## 2015-10-06 NOTE — Progress Notes (Signed)
Initial Nutrition Assessment  DOCUMENTATION CODES:   Severe malnutrition in context of chronic illness, Underweight  INTERVENTION:   -Continue with Ensure Enlive po BID, each supplement provides 350 kcal and 20 grams of protein  NUTRITION DIAGNOSIS:   Malnutrition related to chronic illness as evidenced by percent weight loss, severe depletion of body fat.  GOAL:   Patient will meet greater than or equal to 90% of their needs  MONITOR:   PO intake, Supplement acceptance, Diet advancement, Labs, Weight trends, Skin, I & O's  REASON FOR ASSESSMENT:   Malnutrition Screening Tool    ASSESSMENT:   Eulah Citizendwin Monie is a 82 y.o. male with PMH of dementia, frequent fall, multiple bone fracture in the past, CAD, S/P CABG, atrial fibrillation not on anticoagulants, BPH, stroke, GI bleeding, pneumothorax, GERD, gout, depression, hypertension, who presents with right hip pain after fall.  S/p Procedure(s) 10/05/15: INTRAMEDULLARY (IM) NAIL FEMORAL (Right)  Pt admitted with rt hip fx s/p fall. Pt is resident of [facility].   Pt in bed at time of visit. Unable to obtain hx due to cognitive deficit.   Reviewed wt hx, which revealed a 48# (29.8%) wt loss over the past year, which is significant. UBW around 155#.   Noted multiple liquids (pudding and jello) on bedside table. Pt consumed about 25% of Ensure supplement. Pt already has Ensure Enlive ordered TID; will continue with order.   Nutrition-Focused physical exam completed. Findings are severe fat depletion, severe muscle depletion, and mild edema.   Reviewed RNCM note. Awaiting PT/OT evals for discharge disposition. CSW following for placement.    Labs reviewed.   Diet Order:  Diet full liquid Room service appropriate?: Yes; Fluid consistency:: Thin  Skin:  Wound (see comment) (closed rt hip incision)  Last BM:  PTA  Height:   Ht Readings from Last 1 Encounters:  09/09/15 5\' 8"  (1.727 m)    Weight:   Wt Readings from  Last 1 Encounters:  10/05/15 113 lb (51.256 kg)    Ideal Body Weight:  70 kg  BMI:  Body mass index is 17.19 kg/(m^2).  Estimated Nutritional Needs:   Kcal:  1500-1700  Protein:  65-80 grams  Fluid:  1.5-1.7 L  EDUCATION NEEDS:   Education needs no appropriate at this time  Kaileen Bronkema A. Mayford KnifeWilliams, RD, LDN, CDE Pager: [phone] After hours Pager: [phone]

## 2015-10-07 DIAGNOSIS — G9340 Encephalopathy, unspecified: Secondary | ICD-10-CM

## 2015-10-07 DIAGNOSIS — D62 Acute posthemorrhagic anemia: Secondary | ICD-10-CM

## 2015-10-07 LAB — BASIC METABOLIC PANEL
Anion gap: 6 (ref 5–15)
BUN: 16 mg/dL (ref 6–20)
CALCIUM: 8.6 mg/dL — AB (ref 8.9–10.3)
CO2: 28 mmol/L (ref 22–32)
CREATININE: 0.94 mg/dL (ref 0.61–1.24)
Chloride: 108 mmol/L (ref 101–111)
GFR calc Af Amer: >60 mL/min (ref >60)
GFR calc non Af Amer: >60 mL/min (ref >60)
Glucose, Bld: 89 mg/dL (ref 65–99)
Potassium: 3.7 mmol/L (ref 3.5–5.1)
SODIUM: 142 mmol/L (ref 135–145)

## 2015-10-07 LAB — CBC
HCT: 26.1 % — ABNORMAL LOW (ref 39.0–52.0)
Hemoglobin: 8.7 g/dL — ABNORMAL LOW (ref 13.0–17.0)
MCH: 33 pg (ref 26.0–34.0)
MCHC: 33.3 g/dL (ref 30.0–36.0)
MCV: 98.9 fL (ref 78.0–100.0)
Platelets: 144 10*3/uL — ABNORMAL LOW (ref 150–400)
RBC: 2.64 MIL/uL — ABNORMAL LOW (ref 4.22–5.81)
RDW: 13.9 % (ref 11.5–15.5)
WBC: 9.2 10*3/uL (ref 4.0–10.5)

## 2015-10-07 MED ORDER — TRAMADOL HCL 50 MG PO TABS: 50.0000 mg | ORAL_TABLET | Freq: Four times a day (QID) | ORAL | Status: DC | PRN

## 2015-10-07 MED ORDER — SODIUM CHLORIDE 0.9 % IV SOLN
INTRAVENOUS | Status: DC
  Administered 2015-10-07: 15:00:00 via INTRAVENOUS

## 2015-10-07 MED ORDER — MORPHINE SULFATE (PF) 2 MG/ML IV SOLN: 1.0000 mg | INTRAVENOUS | Status: DC | PRN

## 2015-10-07 NOTE — Progress Notes (Signed)
PROGRESS NOTE    Kristopher [name] UXL:[id number] DOB: 05-15-1932 DOA: 10/04/2015 PCP: Ginette Otto, MD  HPI/Brief narrative 82 y.o. male with PMH of dementia, frequent fall, multiple bone fracture in the past, CAD, S/P CABG, atrial fibrillation not on anticoagulants, BPH, stroke, GI bleeding, pneumothorax, GERD, gout, depression, hypertension, who presents with right hip pain after fall. Work up was significant for subdural hematoma (no surgical intervention required by neurosurgery) & right hip fracture. Patient was transferred from [facility] on 1/11 and underwent right hip surgery (IM nail fixation).   Assessment/Plan:   Closed fracture of right hip (HCC) - S/p IM nail  - SCD for DVT prophylaxis given subdural hematoma. - Discussed with patient's PCP at family's request and with family at length regarding DVT prophylaxis. Family prefers to avoid any form of blood thinners (antiplatelets, Coumadin, Lovenox or NOAC's) after weighing the risks of bleeding Vs benefits of preventing clot/DVT given patient's prior history of GI bleed etc. PCP agrees and recommends continued compression stockings  SDH (subdural hematoma) (HCC):  - has bilateral subacute/chronic SDH with miniscule areas of superimposed acute hemorrhage, - neurosurgery consulted by ED. Dr. Conchita Paris, no need for surgery. Pt can be followed up in his office in about 2 weeks for f/u of the SDH [facility] [phone]). - Neurosurgery input appreciated. CT head does not demonstrate any enlargement of the minimally acute component of SDH. He has stable bilateral subdural fluid collections without any appreciable mass effect.  CAD - s/p of CABG: No CP. - no ASA due to SDH  PAF - CHA2DS2-VASc Score is 5, not on anticoagulation secondary to multiple falls, GI bleed, and currently has subdural hematoma  -continue cardizem ,NSR  Hypertension - Blood pressure acceptable, continue with  Cardizem  Severe protein calorie malnutrition - Continue with ensure  Dementia/acute encephalopathy - Continue with Donepezil. As per daughter at bedside, somnolent this morning. - As per RN, progressively somnolent during course of day. Likely secondary to scheduled tramadol-changed to every 6 hours when necessary at reduced dose. Discussed with RN and advised not to give any sedative medications if drowsy. Monitor closely. - Nothing by mouth except meds if able to take safely-discussed with nursing.  BPH - Continue Flomax and proscar  Gout:  - continue Aloopurinol  Depression - Hold Zoloft given drowsiness.  Acute blood loss anemia and mild thrombocytopenia - Secondary to postoperative blood loss. Follow CBC in a.m. Transfuse if hemoglobin <7 g per DL. Hemoglobin stable in the mid 8 g per DL range.    DVT prophylaxis: SCDs for now. Code Status: DO NOT RESUSCITATE Family Communication: Discussed extensively with patient's daughter Ms. Sherrie Self at bedside on 1/13. Also discussed with patient's PCP via phone. Disposition Plan: DC to SNF when medically stable.   Consultants:  Orthopedics  Discussed with neurosurgery 1/12  Procedures:  Right hip surgery with IM nail fixation on 1/11  Antimicrobials:  None  Subjective: Somnolent this morning as per daughter. As per RN, progressive sleepiness during course of today.  Objective: Filed Vitals:   10/06/15 1436 10/06/15 2206 10/07/15 0551 10/07/15 1322  BP: 128/63 116/51 130/54 126/72  Pulse: 73 89 89 77  Temp: 98.3 F (36.8 C) 97.8 F (36.6 C) 97.9 F (36.6 C) 97.8 F (36.6 C)  TempSrc: Oral Axillary Oral Axillary  Resp: 18 18 16 14   Weight:      SpO2: 99% 94% 94% 98%    Intake/Output Summary (Last 24 hours) at 10/07/15 1728  Last data filed at 10/07/15 1616  Gross per 24 hour  Intake  692.5 ml  Output    200 ml  Net  492.5 ml   Filed Weights   10/05/15 2030  Weight: 51.256 kg (113 lb)     Exam:  General exam: elderly frail chronically ill-looking male lying comfortably propped up in bed. Bilateral hand mittens and bedrails. Respiratory system: Clear/ poor inspiratory effort. . No increased work of breathing. Cardiovascular system: S1 & S2 heard, RRR. No JVD, murmurs, gallops, clicks or pedal edema. Gastrointestinal system: Abdomen is nondistended, soft and nontender. Normal bowel sounds heard. Condom cath.  Central nervous system: Slightly somnolent but easily arousable and mumbles incomprehensively this morning. No focal neurological deficits. Extremities: Symmetric 5 x 5 power.   Data Reviewed: Basic Metabolic Panel:  Recent Labs Lab 10/04/15 2037 10/05/15 0448 10/06/15 0444 10/07/15 0630  NA 138 142 141 142  K 3.9 3.7 3.7 3.7  CL 103 107 108 108  CO2 22 27 28 28   GLUCOSE 123* 115* 119* 89  BUN 20 16 9 16   CREATININE 0.97 0.71 0.68 0.94  CALCIUM 9.1 8.6* 8.4* 8.6*   Liver Function Tests:  Recent Labs Lab 10/05/15 0448  AST 21  ALT 20  ALKPHOS 146*  BILITOT 1.3*  PROT 5.6*  ALBUMIN 3.3*   No results for input(s): LIPASE, AMYLASE in the last 168 hours. No results for input(s): AMMONIA in the last 168 hours. CBC:  Recent Labs Lab 10/04/15 2037 10/05/15 0448 10/06/15 0444 10/07/15 0630  WBC 10.4 10.2 8.6 9.2  NEUTROABS 9.0*  --   --   --   HGB 12.2* 9.8* 8.6* 8.7*  HCT 36.8* 29.3* 26.1* 26.1*  MCV 97.9 96.7 97.0 98.9  PLT 274 184 142* 144*   Cardiac Enzymes: No results for input(s): CKTOTAL, CKMB, CKMBINDEX, TROPONINI in the last 168 hours. BNP (last 3 results) No results for input(s): PROBNP in the last 8760 hours. CBG:  Recent Labs Lab 10/05/15 0738  GLUCAP 100*    No results found for this or any previous visit (from the past 240 hour(s)).       Studies: Ct Head Wo Contrast  10/06/2015  CLINICAL DATA:  Follow-up subdural hematomas. EXAM: CT HEAD WITHOUT CONTRAST TECHNIQUE: Contiguous axial images were obtained from  the base of the skull through the vertex without intravenous contrast. COMPARISON:  10/04/2015 head CT. FINDINGS: Re- demonstrated are bilateral subdural hematomas throughout the bilateral cerebral convexities, left slightly greater than right, not appreciably changed (for example the left subdural fluid collection measures 12 mm in thickness in the right subdural fluid collection measures 8 mm in thickness on series 201/image 21, unchanged since 10/04/2015 using similar measurement technique at similar level). No new fluid hematocrit levels within the subdural collections. Scattered tiny hyperdensities in the left subdural hematoma are unchanged. No evidence of subarachnoid hemorrhage or parenchymal hemorrhage. No mass lesion or midline shift. Basilar cisterns remain patent. No CT evidence of acute infarction. Stable diffuse cerebral volume loss. Intracranial atherosclerosis. Nonspecific stable mild subcortical and periventricular white matter hypodensity, most in keeping with chronic small vessel ischemic change. Ventricles are stable and within normal limits. New mucous retention cyst in the basilar right maxillary sinus. Mild mucosal thickening in the right maxillary sinus. The mastoid air cells are unopacified. No evidence of calvarial fracture. IMPRESSION: 1. Stable bilateral subdural hematomas, left slightly greater than right. No evidence of interval hemorrhage. No midline shift. Basilar cisterns remain patent. 2. No CT evidence  of acute infarction. Stable intracranial atherosclerosis, diffuse cerebral volume loss and chronic small vessel ischemic white matter change. 3. New mucous retention cyst in the right maxillary sinus. Electronically Signed   By: Delbert PhenixJason A Poff M.D.   On: 10/06/2015 18:13        Scheduled Meds: . allopurinol  300 mg Oral Daily  . diltiazem  120 mg Oral Daily  . docusate sodium  100 mg Oral BID  . donepezil  5 mg Oral QHS  . feeding supplement (ENSURE ENLIVE)  237 mL Oral TID  BM  . finasteride  5 mg Oral Daily  . montelukast  10 mg Oral Daily  . pantoprazole  40 mg Oral BID  . sertraline  50 mg Oral Daily  . sodium chloride  3 mL Intravenous Q12H  . tamsulosin  0.4 mg Oral Daily  . traMADol  100 mg Oral 4 times per day   Continuous Infusions: . sodium chloride 75 mL/hr at 10/07/15 1502  . lactated ringers 10 mL/hr at 10/05/15 1131    Principal Problem:   Closed fracture of right hip (HCC) Active Problems:   History of coronary artery bypass graft   History of GI bleed   Paroxysmal atrial fibrillation (HCC)   Personal history of arterial venous malformation (AVM)   Hip fracture (HCC)   Fall   Essential hypertension   Anterior displaced fracture of proximal clavicle with delayed healing   Dementia due to general medical condition without behavioral disturbance   Protein-calorie malnutrition, severe (HCC)   Pneumothorax on right   SDH (subdural hematoma) (HCC)   BPH (benign prostatic hyperplasia)   GERD (gastroesophageal reflux disease)   Gout   Depression    Time spent: 20 minutes.    Marcellus ScottHONGALGI,ANAND, MD, FACP, FHM. [facility] Pager [phone]  If 7PM-7AM, please contact night-coverage [email or web address] Password [email or web address] 10/07/2015, 5:28 PM    LOS: 3 days

## 2015-10-07 NOTE — Evaluation (Signed)
Occupational Therapy Evaluation Patient Details Name: Kristopher [name] MRN: [id number] DOB: 20-May-1932 Today's Date: 10/07/2015    History of Present Illness 82 y/o male s/p IM nail R LE s/p second fall PMH A. fib, CABG, AVMs, DM, HTN, BPH and prior stroke, s/p fall in decempber 2016 3 rib fractures on the right(2, 3 ,4), right frontal lobe laceration, pneumothorax, R prox and distal clavical fracture, R distal acromion fracture, L greater trochanteric fracture. Non-op management per ortho   Clinical Impression   Patient is s/p IM R femur surgery resulting in functional limitations due to the deficits listed below (see OT problem list). [facility] December on L hip Patient will benefit from skilled OT acutely to increase independence and safety with ADLS to allow discharge SNF. Pt will require mattress overlay to prevent skin break down from prolonged positioning in supine.      Follow Up Recommendations  SNF    Equipment Recommendations  Hospital bed;Wheelchair cushion (measurements OT);Wheelchair (measurements OT);Other (comment) (air mattress overlay)    Recommendations for Other Services Speech consult (see eating session)     Precautions / Restrictions Precautions Precautions: Fall Restrictions Weight Bearing Restrictions: Yes RLE Weight Bearing: Weight bearing as tolerated LLE Weight Bearing: Touchdown weight bearing      Mobility Bed Mobility Overal bed mobility: +2 for physical assistance;Needs Assistance Bed Mobility: Supine to Sit;Sit to Supine;Rolling Rolling: +2 for physical assistance;Total assist   Supine to sit: +2 for physical assistance;Total assist Sit to supine: +2 for physical assistance;Total assist   General bed mobility comments: Pt rolling better to the R than L this session . pt requires pillow between knees for alignment  Transfers Overall transfer level: Needs assistance Equipment used: 2 person hand held  assist Transfers: Sit to/from Stand Sit to Stand: +2 physical assistance;Total assist;From elevated surface         General transfer comment: pt unable to tolerate attempt with bed elevated for anterior weight shift.     Balance Overall balance assessment: Needs assistance Sitting-balance support: Bilateral upper extremity supported;Feet supported Sitting balance-Leahy Scale: Poor     Standing balance support: Bilateral upper extremity supported;During functional activity Standing balance-Leahy Scale: Zero                              ADL Overall ADL's : Needs assistance/impaired Eating/Feeding: Maximal assistance;Sitting Eating/Feeding Details (indicate cue type and reason): pt provided hand over hand in seated position and in supine with hOB elevated after mobility. pt provided water bottle with poor awareness on use and then offfered with straw. pt coughing and wet voice sound immediately following both attempts. Daughter present and advised SLP recommendation due to risk for swallowing currently. Daughter states I dont think he is doing well enough to even try right now.  Grooming: Therapist, nutritionalWash/dry face;Total assistance;Bed level Grooming Details (indicate cue type and reason): hand over hand with pt doing minimal initiation                               General ADL Comments: Pt supine wet from incontinence with lack of awareness on arrival ( condom cath dc) Tech called to room to reapply and linen changed. pt sitting EOB this session with strong R lean and grabbing at environmental supports over shooting due to visual deficits. Pt with glasses don. Pt with poor return demo of  po trial with cough and wet voice quality. Pt attempting sit<>STand with bed elevated so transfer was more of an anterior weight shift. Pt not tolerating. pt returned to sitting and then to supine. pt in supine with HOB up fully and provided water trial again with poor return demo. Rn notified.  Md notified. SLP consult placed. Pt startles easily due to visusal deficits and cognitive deficits. Pt with mitten don due to pulling at foley     Vision     Perception     Praxis      Pertinent Vitals/Pain Pain Assessment: Faces Faces Pain Scale: Hurts whole lot Pain Location: when asked "reports No pain" but with mobility verbalized "stop stop" Pain Intervention(s): Repositioned;Monitored during session     Hand Dominance Right   Extremity/Trunk Assessment Upper Extremity Assessment Upper Extremity Assessment: Generalized weakness   Lower Extremity Assessment Lower Extremity Assessment: Defer to PT evaluation   Cervical / Trunk Assessment Cervical / Trunk Assessment: Kyphotic   Communication Communication Communication: HOH (wears hearing aids and pt does not have at hosptial)   Cognition Arousal/Alertness: Lethargic Behavior During Therapy: Flat affect Overall Cognitive Status: History of cognitive impairments - at baseline                     General Comments       Exercises       Shoulder Instructions      Home Living Family/patient expects to be discharged to:: Skilled nursing facility                                 Additional Comments: from SNF  [facility]      Prior Functioning/Environment Level of Independence: Needs assistance  Gait / Transfers Assistance Needed: in rehab after most recent fall in December 2016 ADL's / Homemaking Assistance Needed: total (A)        OT Diagnosis: Generalized weakness;Cognitive deficits;Acute pain   OT Problem List: Decreased strength;Decreased activity tolerance;Impaired balance (sitting and/or standing);Decreased cognition;Decreased safety awareness;Decreased knowledge of use of DME or AE;Decreased knowledge of precautions;Pain;Impaired vision/perception   OT Treatment/Interventions: Self-care/ADL training;Therapeutic exercise;DME and/or AE instruction;Therapeutic activities;Cognitive  remediation/compensation;Visual/perceptual remediation/compensation;Patient/family education;Balance training    OT Goals(Current goals can be found in the care plan section) Acute Rehab OT Goals Patient Stated Goal: none stated OT Goal Formulation: With family Time For Goal Achievement: 10/21/15 Potential to Achieve Goals: Fair  OT Frequency: Min 2X/week   Barriers to D/C: Other (comment) (SNF return)          Co-evaluation              End of Session Nurse Communication: Mobility status;Precautions  Activity Tolerance: Patient limited by pain;Patient limited by lethargy Patient left: in bed;with call bell/phone within reach;with bed alarm set;with family/visitor present   Time: 1010-1042 OT Time Calculation (min): 32 min Charges:  OT General Charges $OT Visit: 1 Procedure OT Evaluation $OT Eval High Complexity: 1 Procedure OT Treatments $Self Care/Home Management : 8-22 mins G-Codes:    Boone Master B 10/31/2015, 11:15 AM  Mateo Flow   OTR/L Pager: [phone] Office: [phone] . Marland Kitchen

## 2015-10-07 NOTE — Progress Notes (Signed)
Patient coughing while drinking thin liquids, MD notified, Speech eval ordered.  Patient extremely drowsy, not following commands well, minimal PO intake.  Per Speech, unable to do eval due to drowsiness, keep NPO until eval is completed.  RN restarted IV fluids due to minimal PO intake and current NPO status. MD notified.  Yesterday patient's PO intake was substantially better, requiring total assistance and verbal instruction/encouragement. No coughing noted with thin liquids yesterday, patient was much more awake as well.

## 2015-10-07 NOTE — Progress Notes (Signed)
Subjective: 2 Days Post-Op Procedure(s) (LRB): INTRAMEDULLARY (IM) NAIL FEMORAL (Right) Patient lying comfortably in bed mittens on hands.    Objective: Vital signs in last 24 hours: Temp:  [97.8 F (36.6 C)-98.3 F (36.8 C)] 97.9 F (36.6 C) (01/13 0551) Pulse Rate:  [73-89] 89 (01/13 0551) Resp:  [16-18] 16 (01/13 0551) BP: (116-130)/(51-63) 130/54 mmHg (01/13 0551) SpO2:  [94 %-99 %] 94 % (01/13 0551)  Intake/Output from previous day: 01/12 0701 - 01/13 0700 In: 3018.2 [P.O.:480; I.V.:2438.2; IV Piggyback:100] Out: 100 [Urine:100] Intake/Output this shift:     Recent Labs  10/04/15 2037 10/05/15 0448 10/06/15 0444 10/07/15 0630  HGB 12.2* 9.8* 8.6* 8.7*    Recent Labs  10/06/15 0444 10/07/15 0630  WBC 8.6 9.2  RBC 2.69* 2.64*  HCT 26.1* 26.1*  PLT 142* 144*    Recent Labs  10/06/15 0444 10/07/15 0630  NA 141 142  K 3.7 3.7  CL 108 108  CO2 28 28  BUN 9 16  CREATININE 0.68 0.94  GLUCOSE 119* 89  CALCIUM 8.4* 8.6*    Recent Labs  10/04/15 2037  INR 1.10    Intact pulses distally Incision: dressing C/D/I  Assessment/Plan: 2 Days Post-Op Procedure(s) (LRB): INTRAMEDULLARY (IM) NAIL FEMORAL (Right) Appreciate med participation in care of this pt.  Tramadol for pain as needed. May allow wbat but would only recommend this for transfers once able from cognitive standpoint.  Will continue to follow.    Margart SicklesChadwell, [place] Dost 10/07/2015, 12:44 PM

## 2015-10-07 NOTE — Evaluation (Signed)
Physical Therapy Evaluation Patient Details Name: Kristopher [name] MRN: [id number] DOB: Jan 15, 1932 Today's Date: 10/07/2015   History of Present Illness  82 y/o male s/p IM nail R LE s/p second fall PMH A. fib, CABG, AVMs, DM, HTN, BPH and prior stroke, s/p fall in decempber 2016 3 rib fractures on the right(2, 3 ,4), right frontal lobe laceration, pneumothorax, R prox and distal clavical fracture, R distal acromion fracture, L greater trochanteric fracture. Non-op management per ortho  Clinical Impression  Patient presents with problems listed below.  Will benefit from acute PT to maximize functional mobility prior to discharge.  Recommend patient return to SNF for continued therapy at d/c.    Follow Up Recommendations SNF;Supervision/Assistance - 24 hour    Equipment Recommendations  Wheelchair (measurements PT);Wheelchair cushion (measurements PT)    Recommendations for Other Services       Precautions / Restrictions Precautions Precautions: Fall Restrictions Weight Bearing Restrictions: Yes RLE Weight Bearing: Weight bearing as tolerated LLE Weight Bearing: Touchdown weight bearing      Mobility  Bed Mobility Overal bed mobility: Needs Assistance;+2 for physical assistance Bed Mobility: Supine to Sit;Sit to Supine;Rolling Rolling: +2 for physical assistance;Total assist   Supine to sit: +2 for physical assistance;Total assist Sit to supine: +2 for physical assistance;Total assist   General bed mobility comments: Verbal and tactile cues for mobility.  Required total +2 assist for all mobility.  Once sitting EOB, patient was able to shift trunk forward and maintain static sitting balance for 10 seconds with min guard assist.  Transfers                 General transfer comment: NT - lethargic and difficulty sitting EOB.  Ambulation/Gait                Stairs            Wheelchair Mobility    Modified Rankin (Stroke Patients Only)       Balance  Overall balance assessment: Needs assistance Sitting-balance support: Bilateral upper extremity supported;Feet supported Sitting balance-Leahy Scale: Poor                                       Pertinent Vitals/Pain Pain Assessment: Faces Faces Pain Scale: Hurts little more Pain Descriptors / Indicators: Grimacing;Guarding Pain Intervention(s): Monitored during session;Repositioned    Home Living Family/patient expects to be discharged to:: Skilled nursing facility                 Additional Comments: from SNF  [facility]    Prior Function Level of Independence: Needs assistance (in rehab after most recent fall in December 2016)   Gait / Transfers Assistance Needed: Receiving therapy for Lt hip fx at [facility]  ADL's / Homemaking Assistance Needed: total (A)        Hand Dominance   Dominant Hand: Right    Extremity/Trunk Assessment   Upper Extremity Assessment: Defer to OT evaluation           Lower Extremity Assessment: LLE deficits/detail;RLE deficits/detail RLE Deficits / Details: Decreased strength and ROM - s/p IM nail.  Difficult to assess due to pain and dementia LLE Deficits / Details: Decreased strength and ROM - hip fx in Dec.  Difficult to assess due to pain and dementia  Cervical / Trunk Assessment: Kyphotic  Communication   Communication: HOH (wears hearing aids and pt does not have  at hosptial)  Cognition Arousal/Alertness: Lethargic Behavior During Therapy: Flat affect Overall Cognitive Status: No family/caregiver present to determine baseline cognitive functioning (Patient with dementia per chart)                      General Comments      Exercises        Assessment/Plan    PT Assessment Patient needs continued PT services  PT Diagnosis Difficulty walking;Generalized weakness;Acute pain;Altered mental status   PT Problem List Decreased strength;Decreased range of motion;Decreased activity tolerance;Decreased  balance;Decreased mobility;Decreased cognition;Decreased knowledge of precautions;Pain  PT Treatment Interventions Functional mobility training;Therapeutic activities;Therapeutic exercise;Balance training;Patient/family education   PT Goals (Current goals can be found in the Care Plan section) Acute Rehab PT Goals Patient Stated Goal: none stated PT Goal Formulation: Patient unable to participate in goal setting Time For Goal Achievement: 10/21/15 Potential to Achieve Goals: Fair    Frequency Min 3X/week   Barriers to discharge        Co-evaluation               End of Session   Activity Tolerance: Patient limited by lethargy;Patient limited by pain Patient left: in bed;with call bell/phone within reach           Time: 1440-1451 PT Time Calculation (min) (ACUTE ONLY): 11 min   Charges:   PT Evaluation $PT Eval High Complexity: 1 Procedure     PT G CodesVena Austria:        Jaeshaun Riva H 10/07/2015, 6:57 PM Durenda HurtSusan H. Renaldo Fiddleravis, PT, [facility] CampusMBA Acute Rehab Services Pager [phone]

## 2015-10-07 NOTE — Progress Notes (Signed)
I have reviewed the pts CT. Per report, he has remained at neurologic baseline. CT does not demonstrate any enlargement of the minimal acute component of SDH. He has stable bilateral subdural fluid collections without any appreciable mass effect. Would be ok to start low dose (81mg ) ASA if needed for postop prophylaxis after right fem IM nail.

## 2015-10-07 NOTE — Care Management Important Message (Signed)
Important Message  Patient Details  Name: Kristopher [name] MRN: [id number] Date of Birth: 29-May-1932   Medicare Important Message Given:  Yes    Stafford Riviera P Levon Penning 10/07/2015, 3:25 PM

## 2015-10-07 NOTE — Evaluation (Signed)
Clinical/Bedside Swallow Evaluation Patient Details  Name: Kristopher [name] MRN: [id number] Date of Birth: [birth year]  Today's Date: 10/07/2015 Time: SLP Start Time (ACUTE ONLY): 1340 SLP Stop Time (ACUTE ONLY): 1400 SLP Time Calculation (min) (ACUTE ONLY): 20 min  Past Medical History:  Past Medical History  Diagnosis Date  . Atrial fibrillation (HCC)   . Hx of CABG   . Hypertension   . DJD (degenerative joint disease), cervical   . Colon polyps   . Gout   . BPH (benign prostatic hyperplasia)   . Stroke [facility](HCC)     "light stroke" per daughter  . Fall    Past Surgical History:  Past Surgical History  Procedure Laterality Date  . Coronary artery bypass graft    . Appendectomy    . Tonsillectomy    . Colonoscopy    . Upper gastrointestinal endoscopy    . Esophagogastroduodenoscopy N/A 12/20/2013    Procedure: ESOPHAGOGASTRODUODENOSCOPY (EGD);  Surgeon: Iva Booparl E Gessner, MD;  [facility];  Service: Endoscopy;  Laterality: N/A;  might be bedside  . Colonoscopy N/A 12/22/2013    Procedure: COLONOSCOPY;  Surgeon: Rachael Feeaniel P Jacobs, MD;  [facility];  Service: Endoscopy;  Laterality: N/A;  . Givens capsule study N/A 12/22/2013    Procedure: GIVENS CAPSULE STUDY;  Surgeon: Rachael Feeaniel P Jacobs, MD;  [facility];  Service: Endoscopy;  Laterality: N/A;  . Eye muscle surgery    . Femur im nail Right 10/05/2015    Procedure: INTRAMEDULLARY (IM) NAIL FEMORAL;  Surgeon: Frederico Hammananiel Caffrey, MD;  Location: [facility] OR;  Service: Orthopedics;  Laterality: Right;   HPI:  82 y.o. male with PMH of dementia, frequent fall, multiple bone fracture in the past, CAD, S/P CABG, atrial fibrillation not on anticoagulants, BPH, stroke, GI bleeding, pneumothorax, GERD, gout, depression, hypertension, who presents with right hip pain after fall. Work up was significant for subdural hematoma (no surgical intervention required by neurosurgery) & right hip fracture. [facility] and underwent right hip surgery (IM nail fixation). Pt observed to cough by OT during session. Pt seen by SLP on 09/09/15 found to be WNL.    Assessment / Plan / Recommendation Clinical Impression  Pts ability to swallow is impacted by AMS. Pts eyes are open, looking up to ceiling, but not following commands, poorly aware of tactile cues. Noted to have dark brown dried secretions all over tongue and lips, appearing like dried blood. SLP removed with oral care with much improvement (informed RN of these findings). Despite max cues, pt was not sufficiently aware of any PO trials and did not actively accept any trials. Pt did automatically chew and swallow an ice chip when placed in his mouth with strong mastication and swallow response, but was otherwise holding boluses.  RN agrees that pt was been sleepy all day and has had trouble swallowing. Given mentation, pt is not appropriate for any POs at this time due to high risk of aspiration. SLP will follow for trials of PO to determine readiness to resume diet. RN aware.     Aspiration Risk  Severe aspiration risk;Risk for inadequate nutrition/hydration    Diet Recommendation NPO   Medication Administration: Via alternative means    Other  Recommendations Oral Care Recommendations: Oral care QID Other Recommendations: Have oral suction available   Follow up Recommendations  Skilled Nursing facility    Frequency and Duration            Prognosis  Swallow Study   General HPI: 82 y.o. male with PMH of dementia, frequent fall, multiple bone fracture in the past, CAD, S/P CABG, atrial fibrillation not on anticoagulants, BPH, stroke, GI bleeding, pneumothorax, GERD, gout, depression, hypertension, who presents with right hip pain after fall. Work up was significant for subdural hematoma (no surgical intervention required by neurosurgery) & right hip fracture. Patient was transferred from [facility] on 1/11 and underwent right hip surgery  (IM nail fixation). Pt observed to cough by OT during session. Pt seen by SLP on 09/09/15 found to be WNL.  Type of Study: Bedside Swallow Evaluation Previous Swallow Assessment: BSE on 09/09/15 Diet Prior to this Study: Regular;Thin liquids Temperature Spikes Noted: No Respiratory Status: Room air History of Recent Intubation: No Behavior/Cognition: Lethargic/Drowsy;Doesn't follow directions;Confused Oral Cavity Assessment: Dried secretions Oral Care Completed by SLP: Yes Oral Cavity - Dentition: Missing dentition Vision: Impaired for self-feeding Self-Feeding Abilities: Total assist Patient Positioning: Upright in bed Baseline Vocal Quality: Not observed Volitional Cough: Cognitively unable to elicit    Oral/Motor/Sensory Function Overall Oral Motor/Sensory Function: Other (comment) (would not follow commands)   Ice Chips Ice chips: Within functional limits   Thin Liquid Thin Liquid: Impaired Presentation: Cup;Straw;Spoon Oral Phase Impairments: Poor awareness of bolus    Nectar Thick Nectar Thick Liquid: Not tested   Honey Thick Honey Thick Liquid: Not tested   Puree Puree: Impaired Presentation: Spoon Oral Phase Impairments: Poor awareness of bolus   Solid   GO   Solid: Impaired Presentation: Spoon Oral Phase Impairments: Poor awareness of bolus Oral Phase Functional Implications: Impaired mastication;Oral holding;Oral residue;Prolonged oral transit        Jaileigh Weimer, Riley Nearing 10/07/2015,3:27 PM

## 2015-10-08 DIAGNOSIS — R1310 Dysphagia, unspecified: Secondary | ICD-10-CM

## 2015-10-08 LAB — CBC
HCT: 26.4 % — ABNORMAL LOW (ref 39.0–52.0)
Hemoglobin: 8.7 g/dL — ABNORMAL LOW (ref 13.0–17.0)
MCH: 32.5 pg (ref 26.0–34.0)
MCHC: 33 g/dL (ref 30.0–36.0)
MCV: 98.5 fL (ref 78.0–100.0)
PLATELETS: 191 10*3/uL (ref 150–400)
RBC: 2.68 MIL/uL — ABNORMAL LOW (ref 4.22–5.81)
RDW: 13.8 % (ref 11.5–15.5)
WBC: 10.6 10*3/uL — ABNORMAL HIGH (ref 4.0–10.5)

## 2015-10-08 NOTE — Progress Notes (Signed)
PROGRESS NOTE    Kristopher [name] ZOX:[id number] DOB: 05-06-1932 DOA: 10/04/2015 PCP: Ginette OttoSTONEKING,HAL THOMAS, MD  HPI/Brief narrative 82 y.o. male with PMH of dementia, frequent fall, multiple bone fracture in the past, CAD, S/P CABG, atrial fibrillation not on anticoagulants, BPH, stroke, GI bleeding, pneumothorax, GERD, gout, depression, hypertension, who presents with right hip pain after fall. Work up was significant for subdural hematoma (no surgical intervention required by neurosurgery) & right hip fracture. [facility] and underwent right hip surgery (IM nail fixation).   Assessment/Plan:   Closed fracture of right hip (HCC) - S/p IM nail  - SCD for DVT prophylaxis given subdural hematoma. - Discussed with patient's PCP at family's request and with family at length regarding DVT prophylaxis. Family prefers to avoid any form of blood thinners (antiplatelets, Coumadin, Lovenox or NOAC's) after weighing the risks of bleeding Vs benefits of preventing clot/DVT given patient's prior history of GI bleed etc. PCP agrees and recommends continued compression stockings  SDH (subdural hematoma) (HCC):  - has bilateral subacute/chronic SDH with miniscule areas of superimposed acute hemorrhage, - neurosurgery consulted by ED. Dr. Conchita ParisNundkumar, no need for surgery. Pt can be followed up in his office in about 2 weeks for f/u of the SDH [facility] [phone]). - Neurosurgery input appreciated. CT head does not demonstrate any enlargement of the minimally acute component of SDH. He has stable bilateral subdural fluid collections without any appreciable mass effect.  CAD - s/p of CABG: No CP. - no ASA due to SDH  PAF - CHA2DS2-VASc Score is 5, not on anticoagulation secondary to multiple falls, GI bleed, and currently has subdural hematoma  -continue cardizem ,NSR  Hypertension - Blood pressure acceptable, continue with  Cardizem  Severe protein calorie malnutrition - Continue with ensure  Dementia/acute encephalopathy - Patient was somnolent on 1/13-likely secondary to polypharmacy including pain medications. These were minimized. Mental status has improved significantly on 1/14.  Dysphagia -Speech therapy has evaluated today and recommend dysphagia 1 diet and nectar thickened liquids.   BPH - Continue Flomax and proscar  Gout:  - continue Aloopurinol  Depression - Zoloft was held yesterday due to drowsiness-continue to hold for now.  Acute blood loss anemia and mild thrombocytopenia - Secondary to postoperative blood loss. Follow CBC in a.m. Transfuse if hemoglobin <7 g per DL. Hemoglobin stable in the mid 8 g per DL range.    DVT prophylaxis: SCDs for now. Code Status: DO NOT RESUSCITATE Family Communication: None at bedside today.  Disposition Plan: DC to SNF possibly 1/15.   Consultants:  Orthopedics  Discussed with neurosurgery 1/12  Procedures:  Right hip surgery with IM nail fixation on 1/11  Antimicrobials:  None  Subjective: Alert this morning. Oriented to self. "Fine". Does not say much.  Objective: Filed Vitals:   10/07/15 2228 10/08/15 0100 10/08/15 0536 10/08/15 1322  BP: 107/65  135/66 122/64  Pulse: 86  98 92  Temp: 97.1 F (36.2 C)  99.8 F (37.7 C) 97.4 F (36.3 C)  TempSrc: Axillary  Oral Axillary  Resp: 15  15 16   Height:  6' (1.829 m)    Weight:      SpO2: 92%  92% 94%    Intake/Output Summary (Last 24 hours) at 10/08/15 1456 Last data filed at 10/08/15 0800  Gross per 24 hour  Intake   92.5 ml  Output    200 ml  Net -107.5 ml   [employer]ights  10/05/15 2030  Weight: 51.256 kg (113 lb)    Exam:  General exam: elderly frail chronically ill-looking male lying comfortably propped up in bed. Bilateral hand mittens and bedrails. Respiratory system: Clear/ poor inspiratory effort. . No increased work of breathing. Cardiovascular system:  S1 & S2 heard, RRR. No JVD, murmurs, gallops, clicks or pedal edema. Gastrointestinal system: Abdomen is nondistended, soft and nontender. Normal bowel sounds heard. Condom cath.  Central nervous system: Alert and oriented only to self. Mumbles incomprehensively at times. No focal neurological deficits. Extremities: Symmetric 5 x 5 power.Right hip surgical site dressing clean and dry.   Data Reviewed: Basic Metabolic Panel:  Recent Labs Lab 10/04/15 2037 10/05/15 0448 10/06/15 0444 10/07/15 0630  NA 138 142 141 142  K 3.9 3.7 3.7 3.7  CL 103 107 108 108  CO2 22 27 28 28   GLUCOSE 123* 115* 119* 89  BUN 20 16 9 16   CREATININE 0.97 0.71 0.68 0.94  CALCIUM 9.1 8.6* 8.4* 8.6*   Liver Function Tests:  Recent Labs Lab 10/05/15 0448  AST 21  ALT 20  ALKPHOS 146*  BILITOT 1.3*  PROT 5.6*  ALBUMIN 3.3*   No results for input(s): LIPASE, AMYLASE in the last 168 hours. No results for input(s): AMMONIA in the last 168 hours. CBC:  Recent Labs Lab 10/04/15 2037 10/05/15 0448 10/06/15 0444 10/07/15 0630 10/08/15 0501  WBC 10.4 10.2 8.6 9.2 10.6*  NEUTROABS 9.0*  --   --   --   --   HGB 12.2* 9.8* 8.6* 8.7* 8.7*  HCT 36.8* 29.3* 26.1* 26.1* 26.4*  MCV 97.9 96.7 97.0 98.9 98.5  PLT 274 184 142* 144* 191   Cardiac Enzymes: No results for input(s): CKTOTAL, CKMB, CKMBINDEX, TROPONINI in the last 168 hours. BNP (last 3 results) No results for input(s): PROBNP in the last 8760 hours. CBG:  Recent Labs Lab 10/05/15 0738  GLUCAP 100*    No results found for this or any previous visit (from the past 240 hour(s)).       Studies: Ct Head Wo Contrast  10/06/2015  CLINICAL DATA:  Follow-up subdural hematomas. EXAM: CT HEAD WITHOUT CONTRAST TECHNIQUE: Contiguous axial images were obtained from the base of the skull through the vertex without intravenous contrast. COMPARISON:  10/04/2015 head CT. FINDINGS: Re- demonstrated are bilateral subdural hematomas throughout the  bilateral cerebral convexities, left slightly greater than right, not appreciably changed (for example the left subdural fluid collection measures 12 mm in thickness in the right subdural fluid collection measures 8 mm in thickness on series 201/image 21, unchanged since 10/04/2015 using similar measurement technique at similar level). No new fluid hematocrit levels within the subdural collections. Scattered tiny hyperdensities in the left subdural hematoma are unchanged. No evidence of subarachnoid hemorrhage or parenchymal hemorrhage. No mass lesion or midline shift. Basilar cisterns remain patent. No CT evidence of acute infarction. Stable diffuse cerebral volume loss. Intracranial atherosclerosis. Nonspecific stable mild subcortical and periventricular white matter hypodensity, most in keeping with chronic small vessel ischemic change. Ventricles are stable and within normal limits. New mucous retention cyst in the basilar right maxillary sinus. Mild mucosal thickening in the right maxillary sinus. The mastoid air cells are unopacified. No evidence of calvarial fracture. IMPRESSION: 1. Stable bilateral subdural hematomas, left slightly greater than right. No evidence of interval hemorrhage. No midline shift. Basilar cisterns remain patent. 2. No CT evidence of acute infarction. Stable intracranial atherosclerosis, diffuse cerebral volume loss and chronic small vessel ischemic white matter  change. 3. New mucous retention cyst in the right maxillary sinus. Electronically Signed   By: Delbert Phenix M.D.   On: 10/06/2015 18:13        Scheduled Meds: . allopurinol  300 mg Oral Daily  . diltiazem  120 mg Oral Daily  . docusate sodium  100 mg Oral BID  . feeding supplement (ENSURE ENLIVE)  237 mL Oral TID BM  . finasteride  5 mg Oral Daily  . montelukast  10 mg Oral Daily  . pantoprazole  40 mg Oral BID  . sodium chloride  3 mL Intravenous Q12H  . tamsulosin  0.4 mg Oral Daily   Continuous  Infusions: . sodium chloride 50 mL/hr at 10/07/15 1843    Principal Problem:   Closed fracture of right hip (HCC) Active Problems:   History of coronary artery bypass graft   History of GI bleed   Paroxysmal atrial fibrillation (HCC)   Personal history of arterial venous malformation (AVM)   Hip fracture (HCC)   Fall   Essential hypertension   Anterior displaced fracture of proximal clavicle with delayed healing   Dementia due to general medical condition without behavioral disturbance   Protein-calorie malnutrition, severe (HCC)   Pneumothorax on right   SDH (subdural hematoma) (HCC)   BPH (benign prostatic hyperplasia)   GERD (gastroesophageal reflux disease)   Gout   Depression    Time spent: 20 minutes.    Marcellus Scott, MD, FACP, FHM. [facility] Pager [phone]  If 7PM-7AM, please contact night-coverage [email or web address] Password [email or web address] 10/08/2015, 2:56 PM    LOS: 4 days

## 2015-10-08 NOTE — Progress Notes (Signed)
SPORTS MEDICINE AND JOINT REPLACEMENT  Kristopher SpurlingStephen Lucey, MD   Kristopher CabalMaurice Nylan Nakatani, PA-C [address], [place], [place]  [id number]                             [phone]   PROGRESS NOTE  Subjective:  Patient lying comfortably in bed, severe dementia  Objective: Vital signs in last 24 hours:   Patient Vitals for the past 24 hrs:  BP Temp Temp src Pulse Resp SpO2 Height  10/08/15 0536 135/66 mmHg 99.8 F (37.7 C) Oral 98 15 92 % -  10/08/15 0100 - - - - - - 6' (1.829 m)  10/07/15 2228 107/65 mmHg 97.1 F (36.2 C) Axillary 86 15 92 % -  10/07/15 1322 126/72 mmHg 97.8 F (36.6 C) Axillary 77 14 98 % -    @flow {1959:LAST@   Intake/Output from previous day:   01/13 0701 - 01/14 0700 In: 692.5 [P.O.:600; I.V.:92.5] Out: 400 [Urine:400]   Intake/Output this shift:       Intake/Output      01/13 0701 - 01/14 0700 01/14 0701 - 01/15 0700   P.O. 600    I.V. (mL/kg) 92.5 (1.8)    IV Piggyback     Total Intake(mL/kg) 692.5 (13.5)    Urine (mL/kg/hr) 400 (0.3)    Stool 0 (0)    Total Output 400     Net +292.5          Urine Occurrence 1 x    Stool Occurrence 1 x       LABORATORY DATA:  Recent Labs  10/04/15 2037 10/05/15 0448 10/06/15 0444 10/07/15 0630 10/08/15 0501  WBC 10.4 10.2 8.6 9.2 10.6*  HGB 12.2* 9.8* 8.6* 8.7* 8.7*  HCT 36.8* 29.3* 26.1* 26.1* 26.4*  PLT 274 184 142* 144* 191    Recent Labs  10/04/15 2037 10/05/15 0448 10/06/15 0444 10/07/15 0630  NA 138 142 141 142  K 3.9 3.7 3.7 3.7  CL 103 107 108 108  CO2 22 27 28 28   BUN 20 16 9 16   CREATININE 0.97 0.71 0.68 0.94  GLUCOSE 123* 115* 119* 89  CALCIUM 9.1 8.6* 8.4* 8.6*   Lab Results  Component Value Date   INR 1.10 10/04/2015   INR 1.12 09/08/2015   INR 1.16 09/07/2015    Examination:  General appearance: alert Extremities: Homans sign is negative, no sign of DVT  Wound Exam: clean, dry, intact   Drainage:  Scant/small amount Serosanguinous exudate   Sensory Exam: Deep  Peroneal normal   Assessment:    3 Days Post-Op  Procedure(s) (LRB): INTRAMEDULLARY (IM) NAIL FEMORAL (Right)  ADDITIONAL DIAGNOSIS:  Principal Problem:   Closed fracture of right hip (HCC) Active Problems:   History of coronary artery bypass graft   History of GI bleed   Paroxysmal atrial fibrillation (HCC)   Personal history of arterial venous malformation (AVM)   Hip fracture (HCC)   Fall   Essential hypertension   Anterior displaced fracture of proximal clavicle with delayed healing   Dementia due to general medical condition without behavioral disturbance   Protein-calorie malnutrition, severe (HCC)   Pneumothorax on right   SDH (subdural hematoma) (HCC)   BPH (benign prostatic hyperplasia)   GERD (gastroesophageal reflux disease)   Gout   Depression  Acute Blood Loss Anemia   Plan: Physical Therapy as ordered Weight Bearing as Tolerated (WBAT) tranfers only   DISCHARGE PLAN: per medicine  Dayyan Krist 10/08/2015, 9:26 AM

## 2015-10-08 NOTE — Progress Notes (Signed)
Per MD, Pt not ready for d/c today.  Amanda Shanteria Laye, LCSW Clinical Social Work [phone]  

## 2015-10-08 NOTE — Progress Notes (Signed)
Speech Language Pathology Treatment: Dysphagia  Patient Details Name: Kristopher [name] MRN: [id number] DOB: 02/21/1932 Today's Date: 10/08/2015 Time: [id number] SLP Time Calculation (min) (ACUTE ONLY): 21 min  Assessment / Plan / Recommendation Clinical Impression  Pt alert for PO, however remains confused. Noted xerostomia with dried oral secretions which improved with oral care by SLP. Pt presents with suspected cognitively based oropharyngeal dysphagia. Pt with oral bolus swishing prior to swallow initiation with both thin and nectar thick liquids which negatively impacted timing and coordination of swallow function. Overt coughing evidenced following thin liquids, however airway protection did not appear to be compromised with nectar thick liquids despite pts behavioral swishing. Pt with good oral readiness with puree textures without holding of bolus although suspect across a meal additional behaviors may appear. Recommend diet implementation of dysphagia 1 (puree) and nectar thick liquids with medicines crushed in puree. Full supervision with all PO and feeding assistance warranted. SLP to follow up for diet tolerance and upgraded PO trials.        HPI HPI: 82 y.o. male with PMH of dementia, frequent fall, multiple bone fracture in the past, CAD, S/P CABG, atrial fibrillation not on anticoagulants, BPH, stroke, GI bleeding, pneumothorax, GERD, gout, depression, hypertension, who presents with right hip pain after fall. Work up was significant for subdural hematoma (no surgical intervention required by neurosurgery) & right hip fracture. [facility] and underwent right hip surgery (IM nail fixation). Pt observed to cough by OT during session. Pt seen by SLP on 09/09/15 found to be WNL.       SLP Plan  Continue with current plan of care     Recommendations  Diet recommendations: Nectar-thick liquid;Dysphagia 1 (puree) Liquids provided via: Cup Medication  Administration: Crushed with puree Supervision: Full supervision/cueing for compensatory strategies;Staff to assist with self feeding Compensations: Slow rate;Minimize environmental distractions;Small sips/bites Postural Changes and/or Swallow Maneuvers: Seated upright 90 degrees;Upright 30-60 min after meal             Oral Care Recommendations: Oral care BID Follow up Recommendations: Skilled Nursing facility Plan: Continue with current plan of care     GO               Marcene Duoshelsea Sumney MA, CCC-SLP Acute Care Speech Language Pathologist    Kennieth RadSumney, Rennie Hack E 10/08/2015, 9:24 AM

## 2015-10-08 NOTE — Progress Notes (Signed)
Pt more awake this pm but po intake remains very poor. Nectar thick liquids ordered, pt only taking small amounts of fluids

## 2015-10-09 ENCOUNTER — Inpatient Hospital Stay (HOSPITAL_COMMUNITY): Payer: Medicare Other

## 2015-10-09 DIAGNOSIS — K913 Postprocedural intestinal obstruction: Secondary | ICD-10-CM

## 2015-10-09 LAB — TYPE AND SCREEN
ABO/RH(D): O POS
ANTIBODY SCREEN: NEGATIVE
UNIT DIVISION: 0
Unit division: 0

## 2015-10-09 LAB — CBC
HCT: 27 % — ABNORMAL LOW (ref 39.0–52.0)
Hemoglobin: 8.7 g/dL — ABNORMAL LOW (ref 13.0–17.0)
MCH: 31.6 pg (ref 26.0–34.0)
MCHC: 32.2 g/dL (ref 30.0–36.0)
MCV: 98.2 fL (ref 78.0–100.0)
PLATELETS: 196 10*3/uL (ref 150–400)
RBC: 2.75 MIL/uL — AB (ref 4.22–5.81)
RDW: 14 % (ref 11.5–15.5)
WBC: 11.4 10*3/uL — ABNORMAL HIGH (ref 4.0–10.5)

## 2015-10-09 LAB — BASIC METABOLIC PANEL
Anion gap: 20 — ABNORMAL HIGH (ref 5–15)
BUN: 98 mg/dL — AB (ref 6–20)
CHLORIDE: 112 mmol/L — AB (ref 101–111)
CO2: 16 mmol/L — AB (ref 22–32)
CREATININE: 4.99 mg/dL — AB (ref 0.61–1.24)
Calcium: 7.9 mg/dL — ABNORMAL LOW (ref 8.9–10.3)
GFR calc Af Amer: 11 mL/min — ABNORMAL LOW (ref >60)
GFR calc non Af Amer: 10 mL/min — ABNORMAL LOW (ref >60)
Glucose, Bld: 109 mg/dL — ABNORMAL HIGH (ref 65–99)
POTASSIUM: 4.7 mmol/L (ref 3.5–5.1)
SODIUM: 148 mmol/L — AB (ref 135–145)

## 2015-10-09 LAB — GLUCOSE, CAPILLARY: GLUCOSE-CAPILLARY: 133 mg/dL — AB (ref 65–99)

## 2015-10-09 MED ORDER — SODIUM CHLORIDE 0.9 % IV BOLUS (SEPSIS)
500.0000 mL | Freq: Once | INTRAVENOUS | Status: AC
  Administered 2015-10-09: 500 mL via INTRAVENOUS

## 2015-10-09 MED ORDER — BISACODYL 10 MG RE SUPP
10.0000 mg | Freq: Once | RECTAL | Status: AC
  Administered 2015-10-09: 10 mg via RECTAL
  Filled 2015-10-09: qty 1

## 2015-10-09 MED ORDER — SODIUM CHLORIDE 0.9 % IV BOLUS (SEPSIS)
500.0000 mL | Freq: Once | INTRAVENOUS | Status: AC
Start: 2015-10-09 — End: 2015-10-09
  Administered 2015-10-09: 500 mL via INTRAVENOUS

## 2015-10-09 MED ORDER — SODIUM CHLORIDE 0.9 % IV SOLN
INTRAVENOUS | Status: DC
  Administered 2015-10-09: 14:00:00 via INTRAVENOUS

## 2015-10-09 MED ORDER — MORPHINE SULFATE (CONCENTRATE) 10 MG/0.5ML PO SOLN: 10.0000 mg | ORAL | Status: DC | PRN

## 2015-10-09 NOTE — Progress Notes (Signed)
Pt continues to deteriorate gradually. Another bolus infusing as ordered by MD. Family at bedside, waiting for another sister to arrive

## 2015-10-09 NOTE — Progress Notes (Signed)
Pt very lethargic. Attempts to feed lunch unsuccessful d/t reduced alertness. Will attempt again later

## 2015-10-09 NOTE — Progress Notes (Signed)
Pt gazing up, only opening eyes slightly when call his name.  BP manual left arm is 52/36, pulse is 93, resp.26. O2 sat is 97 on RA.  Dr. Waymon AmatoHongalgi notified, order for 500 cc NS bolus, started.

## 2015-10-09 NOTE — Progress Notes (Signed)
Per MD, Pt not ready for d/c today.  Amanda Brittinee Risk, LCSW Clinical Social Work [phone]  

## 2015-10-09 NOTE — Progress Notes (Signed)
Addendum  RN reported hypotension, BP 62/44 and lethargy. Requested manual BP recheck. DC Cardizem. NS IV bolus and infusion. Monitor closely.  Kristopher ScottHONGALGI,Kristopher Buel, MD, FACP, FHM. [facility] Pager [phone]  If 7PM-7AM, please contact night-coverage [email or web address] Password [email or web address] 10/09/2015, 1:39 PM

## 2015-10-09 NOTE — Progress Notes (Signed)
MD paged again as pt is running a low grade fever, blood pressure low and mental status very altered. MD called back and asked for manual blood pressure reading to both upper extremities

## 2015-10-09 NOTE — Progress Notes (Signed)
MD paged as family are requesting to talk to him. Pt currently in room visiting

## 2015-10-09 NOTE — Progress Notes (Signed)
PROGRESS NOTE    Kristopher [name] ZOX:[id number] DOB: December 15, 1931 DOA: 10/04/2015 PCP: Ginette Otto, MD  HPI/Brief narrative 82 y.o. male with PMH of dementia, frequent fall, multiple bone fracture in the past, CAD, S/P CABG, atrial fibrillation not on anticoagulants, BPH, stroke, GI bleeding, pneumothorax, GERD, gout, depression, hypertension, who presents with right hip pain after fall. Work up was significant for subdural hematoma (no surgical intervention required by neurosurgery) & right hip fracture. Patient was transferred from [facility] on 1/11 and underwent right hip surgery (IM nail fixation). Postoperative course complicated by her to mental status/drowsiness related to pain medications-has improved & postop ileus.   Assessment/Plan:   Closed fracture of right hip (HCC) - S/p IM nail  - Discussed with patient's PCP at family's request and with family at length regarding DVT prophylaxis. Family prefers to avoid any form of blood thinners (antiplatelets, Coumadin, Lovenox or NOAC's) after weighing the risks of bleeding Vs benefits of preventing clot/DVT given patient's prior history of GI bleed etc. PCP agrees and recommends continued compression stockings  - Up to chair with assistance/mobilize given ileus.   Postop ileus - Noted abdominal distention and tachypnea related to same on 1/15. X-rays confirm ileus. Mobilize chair as tolerated, change to nothing by mouth except meds, trial of Dulcolax suppository. Discussed with daughter who will communicate with rest of family to decide whether NG tube needs to be placed if he starts vomiting.  SDH (subdural hematoma) (HCC):  - has bilateral subacute/chronic SDH with miniscule areas of superimposed acute hemorrhage, - neurosurgery consulted by ED. Dr. Conchita Paris, no need for surgery. Pt can be followed up in his office in about 2 weeks for f/u of the SDH [facility] [phone]). - Neurosurgery input  appreciated. CT head does not demonstrate any enlargement of the minimally acute component of SDH. He has stable bilateral subdural fluid collections without any appreciable mass effect.  CAD - s/p of CABG: No CP. - no ASA due to SDH  PAF - CHA2DS2-VASc Score is 5, not on anticoagulation secondary to multiple falls, GI bleed, and currently has subdural hematoma  -continue cardizem ,NSR  Hypertension - Blood pressure acceptable, continue with Cardizem  Severe protein calorie malnutrition - Continue with ensure  Dementia/acute encephalopathy - Patient was somnolent on 1/13-likely secondary to polypharmacy including pain medications. These were minimized. Mental status has improved since 1/14 but continues to wax and wane.  Dysphagia - Speech therapy has evaluated today and recommend dysphagia 1 diet and nectar thickened liquids. NPO today d/t ileus.  BPH - Continue Flomax and proscar  Gout:  - continue Aloopurinol  Depression - Zoloft was held due to drowsiness-continue to hold for now.  Acute blood loss anemia and mild thrombocytopenia - Secondary to postoperative blood loss. Follow CBC in a.m. Transfuse if hemoglobin <7 g per DL. Hemoglobin stable in the mid 8 g per DL range.  Adult failure to thrive - Multifactorial secondary to advanced dementia and acute illness. Discussed at length with daughter and advised her that if patient does not make steady recovery then may consider palliative care consultation for goals.    DVT prophylaxis: SCDs for now. Code Status: DO NOT RESUSCITATE Family Communication:  discussed with patient's daughter Ms. [name] on 1/15  Disposition Plan: DC to SNF possibly 1/15.   Consultants:  Orthopedics  Discussed with neurosurgery 1/12  Procedures:  Right hip surgery with IM nail fixation on 1/11  Antimicrobials:  None  Subjective: Slightly somnolent  but easily arousable this morning. Nonverbal. Appears mildly tachypneic.  Does not appear in pain. As per RN, no acute issues reported.   Objective: Filed Vitals:   10/08/15 0536 10/08/15 1322 10/08/15 2123 10/09/15 0535  BP: 135/66 122/64 122/73 116/59  Pulse: 98 92 102 115  Temp: 99.8 F (37.7 C) 97.4 F (36.3 C) 98.3 F (36.8 C) 98.2 F (36.8 C)  TempSrc: Oral Axillary Oral   Resp: 15 16 16 16   Height:      Weight:      SpO2: 92% 94% 100% 99%    Intake/Output Summary (Last 24 hours) at 10/09/15 1211 Last data filed at 10/09/15 0535  Gross per 24 hour  Intake     60 ml  Output    650 ml  Net   -590 ml   Filed Weights   10/05/15 2030  Weight: 51.256 kg (113 lb)    Exam:  General exam: elderly frail chronically ill-looking male lying propped up in bed with mild tachypnea. Does not appear pain.  Respiratory system: Clear/ poor inspiratory effort. . No increased work of breathing. Cardiovascular system: S1 & S2 heard, RRR. No JVD, murmurs, gallops, clicks or pedal edema. Gastrointestinal system: Abdomen is distended, nontender with paucity of bowel sounds. Condom cath.  Central nervous system: somnolent but easily arousable. Nonverbal. No focal neurological deficits. Extremities: Symmetric 5 x 5 power.Right hip surgical site dressing clean and dry.   Data Reviewed: Basic Metabolic Panel:  Recent Labs Lab 10/04/15 2037 10/05/15 0448 10/06/15 0444 10/07/15 0630  NA 138 142 141 142  K 3.9 3.7 3.7 3.7  CL 103 107 108 108  CO2 22 27 28 28   GLUCOSE 123* 115* 119* 89  BUN 20 16 9 16   CREATININE 0.97 0.71 0.68 0.94  CALCIUM 9.1 8.6* 8.4* 8.6*   Liver Function Tests:  Recent Labs Lab 10/05/15 0448  AST 21  ALT 20  ALKPHOS 146*  BILITOT 1.3*  PROT 5.6*  ALBUMIN 3.3*   No results for input(s): LIPASE, AMYLASE in the last 168 hours. No results for input(s): AMMONIA in the last 168 hours. CBC:  Recent Labs Lab 10/04/15 2037 10/05/15 0448 10/06/15 0444 10/07/15 0630 10/08/15 0501  WBC 10.4 10.2 8.6 9.2 10.6*  NEUTROABS  9.0*  --   --   --   --   HGB 12.2* 9.8* 8.6* 8.7* 8.7*  HCT 36.8* 29.3* 26.1* 26.1* 26.4*  MCV 97.9 96.7 97.0 98.9 98.5  PLT 274 184 142* 144* 191   Cardiac Enzymes: No results for input(s): CKTOTAL, CKMB, CKMBINDEX, TROPONINI in the last 168 hours. BNP (last 3 results) No results for input(s): PROBNP in the last 8760 hours. CBG:  Recent Labs Lab 10/05/15 0738 10/09/15 0752  GLUCAP 100* 133*    No results found for this or any previous visit (from the past 240 hour(s)).       Studies: Dg Chest Port 1 View  10/09/2015  CLINICAL DATA:  Shortness of breath. Postop from right hip fracture fixation. Coronary artery disease. EXAM: PORTABLE CHEST 1 VIEW COMPARISON:  10/04/2015 FINDINGS: Heart size is normal. Low lung volumes are seen with mild bibasilar atelectasis. No evidence pulmonary consolidation or edema. No evidence of pneumothorax or pleural effusion. Prior CABG noted. IMPRESSION: Low lung volumes with mild bibasilar atelectasis. Electronically Signed   By: Myles RosenthalJohn  Stahl M.D.   On: 10/09/2015 11:42   Dg Abd Portable 1v  10/09/2015  CLINICAL DATA:  Ileus. Internal fixation of hip fracture 10/04/2015.  EXAM: PORTABLE ABDOMEN - 1 VIEW COMPARISON:  06/04/2015 and CT 09/07/2015 FINDINGS: Examination demonstrates multiple air-filled loops of large and small bowel with dilatation of several small bowel loops measuring up to the 6 cm in diameter. Air and stool present over the rectum. Findings likely secondary to postoperative ileus. No definite free peritoneal air. Evidence of known bilateral nephrolithiasis. There are degenerative changes of the spine. Severe degenerate change of the left hip. IMPRESSION: Multiple air-filled dilated large and small bowel loops likely postoperative ileus. These results will be called to the ordering clinician or representative by the Radiologist Assistant, and communication documented in the PACS or zVision Dashboard. Electronically Signed   By: Elberta Fortis  M.D.   On: 10/09/2015 11:50        Scheduled Meds: . allopurinol  300 mg Oral Daily  . diltiazem  120 mg Oral Daily  . docusate sodium  100 mg Oral BID  . feeding supplement (ENSURE ENLIVE)  237 mL Oral TID BM  . finasteride  5 mg Oral Daily  . montelukast  10 mg Oral Daily  . pantoprazole  40 mg Oral BID  . sodium chloride  3 mL Intravenous Q12H  . tamsulosin  0.4 mg Oral Daily   Continuous Infusions:    Principal Problem:   Closed fracture of right hip (HCC) Active Problems:   History of coronary artery bypass graft   History of GI bleed   Paroxysmal atrial fibrillation (HCC)   Personal history of arterial venous malformation (AVM)   Hip fracture (HCC)   Fall   Essential hypertension   Anterior displaced fracture of proximal clavicle with delayed healing   Dementia due to general medical condition without behavioral disturbance   Protein-calorie malnutrition, severe (HCC)   Pneumothorax on right   SDH (subdural hematoma) (HCC)   BPH (benign prostatic hyperplasia)   GERD (gastroesophageal reflux disease)   Gout   Depression    Time spent: 20 minutes.    Marcellus Scott, MD, FACP, FHM. [facility] Pager [phone]  If 7PM-7AM, please contact night-coverage [email or web address] Password [email or web address] 10/09/2015, 12:11 PM    LOS: 5 days

## 2015-10-09 NOTE — Progress Notes (Signed)
Manual BP readings, left arm 80/41, right arm 78/39. Bolus of normal saline initiated as ordered by MD. Temp down to 37.0

## 2015-10-09 NOTE — Progress Notes (Signed)
Addendum  Persistent hypotension (52/46) despite NS bolus 500 ml. Rapidly declining. Discussed with RN and Consulting civil engineercharge RN. Repeating NS bolus. No bleeding reported. Hypotension may be multifactorial from dehydration d/t poor PO, Cardizem. No overt features of infection/bleeding. Discussed at length with daughter, Ms Charlton AmorSherrie Self, updated critical situation of patient and poor prognosis. She has been discussing with extended family. They have decided against NGT.   Kristopher [name],Kristopher Milling, MD, FACP, FHM. [facility] Pager [phone]  If 7PM-7AM, please contact night-coverage [email or web address] Password [email or web address] 10/09/2015, 5:10 PM

## 2015-10-09 NOTE — Progress Notes (Signed)
   10/09/15 1600  Clinical Encounter Type  Visited With Health care provider  Visit Type Patient actively dying;Critical Care;Initial  Referral From Nurse  [facility] actively dying; MD notifying daughter; [facility] until daughter present and then is available as needed for support.  Kristopher LevineMichael I Anahlia Iseminger 4:50 PM

## 2015-10-09 NOTE — Progress Notes (Signed)
Spoke with Dr. Waymon AmatoHongalgi, order placed for 500 cc bolus, compassion cart ordered for family from hospitality.  Spoke with family and they are awaiting sibling from [place] come.

## 2015-10-10 DIAGNOSIS — E870 Hyperosmolality and hypernatremia: Secondary | ICD-10-CM

## 2015-10-10 DIAGNOSIS — N179 Acute kidney failure, unspecified: Secondary | ICD-10-CM

## 2015-10-10 LAB — BLOOD PRODUCT ORDER (VERBAL) VERIFICATION

## 2015-10-10 MED ORDER — SODIUM CHLORIDE 0.45 % IV SOLN
INTRAVENOUS | Status: DC
  Administered 2015-10-10: 08:00:00 via INTRAVENOUS

## 2015-10-10 MED ORDER — MORPHINE SULFATE (CONCENTRATE) 10 MG/0.5ML PO SOLN: 5.0000 mg | ORAL | Status: AC | PRN

## 2015-10-10 NOTE — NC FL2 (Signed)
[place] MEDICAID FL2 LEVEL OF CARE SCREENING TOOL     IDENTIFICATION  Patient Name: Kristopher [name] Birthdate: 05-22-1932 Sex: male Admission Date (Current Location): 10/04/2015  [facility] [place]   Facility and Address:  [facility] [place], [address], [place], [place] [address]      Provider Number: [id number]  Attending Physician Name and Address:  Elease EtienneAnand D Hongalgi, MD  Relative Name and Phone Number:   Cordelia Pen([name])    Current Level of Care: Hospital Recommended Level of Care:  [facility] Prior Approval Number:    Date Approved/Denied:   PASRR Number:    Discharge Plan: SNF    Current Diagnoses: Patient Active Problem List   Diagnosis Date Noted  . BPH (benign prostatic hyperplasia) 10/05/2015  . GERD (gastroesophageal reflux disease) 10/05/2015  . Gout 10/05/2015  . Depression 10/05/2015  . Closed fracture of right hip (HCC) 10/04/2015  . SDH (subdural hematoma) (HCC) 10/04/2015  . Dementia due to general medical condition without behavioral disturbance 09/12/2015  . Protein-calorie malnutrition, severe (HCC) 09/12/2015  . Pneumothorax on right   . Fracture of pubis (HCC)   . Anterior displaced fracture of proximal clavicle with delayed healing   . Pneumothorax 09/07/2015  . Rib fracture 09/07/2015  . Hip fracture (HCC) 09/07/2015  . Pelvic fracture (HCC) 09/07/2015  . Anemia 09/07/2015  . Syncope and collapse 09/07/2015  . Fall 09/07/2015  . Hypertension   . Clavicle fracture   . Essential hypertension   . CN (constipation) 03/25/2015  . Personal history of arterial venous malformation (AVM) 03/25/2015  . Osteoarthritis 12/13/2014  . Paroxysmal atrial fibrillation (HCC) 12/13/2014  . Systolic murmur 12/13/2014  . Atherosclerosis of native coronary artery of native heart without angina pectoris 05/11/2014  . History of coronary artery bypass graft 05/11/2014  . History of GI bleed 05/11/2014   . Left leg pain 05/11/2014    Orientation RESPIRATION BLADDER Height & Weight     (DOx4)  O2 ([place] 2L) Continent 5\' 8"  (172.7 cm) 113 lbs.  BEHAVIORAL SYMPTOMS/MOOD NEUROLOGICAL BOWEL NUTRITION STATUS      Incontinent Diet (see DC summary)  AMBULATORY STATUS COMMUNICATION OF NEEDS Skin   Extensive Assist Verbally Surgical wounds                       Personal Care Assistance Level of Assistance  Bathing, Dressing Bathing Assistance: Maximum assistance   Dressing Assistance: Maximum assistance     Functional Limitations Info  Sight, Hearing Sight Info: Impaired Hearing Info: Impaired      SPECIAL CARE FACTORS FREQUENCY  PT (By licensed PT), OT (By licensed OT)     PT Frequency: 5/wk OT Frequency: 5/wk            Contractures      Additional Factors Info  Code Status, Allergies, Psychotropic Code Status Info: DNR Allergies Info: Codeine, Other, Sulfa Antibiotics Psychotropic Info: zoloft         Current Medications (10/10/2015):  This is the current hospital active medication list Current Facility-Administered Medications  Medication Dose Route Frequency Provider Last Rate Last Dose  . acetaminophen (TYLENOL) tablet 650 mg  650 mg Oral Q6H PRN Lorretta HarpXilin Niu, MD       Or  . acetaminophen (TYLENOL) suppository 650 mg  650 mg Rectal Q6H PRN Lorretta HarpXilin Niu, MD      . albuterol (PROVENTIL) (2.5 MG/3ML) 0.083% nebulizer solution 2.5 mg  2.5 mg  Nebulization Q6H PRN Lorretta Harp, MD      . bisacodyl (DULCOLAX) suppository 10 mg  10 mg Rectal Daily PRN Margart Sickles, PA-C      . docusate sodium (COLACE) capsule 100 mg  100 mg Oral BID Margart Sickles, PA-C   100 mg at 10/09/15 0959  . morphine CONCENTRATE 10 MG/0.5ML oral solution 10 mg  10 mg Oral Q2H PRN Rolan Lipa, NP      . ondansetron [facility]) tablet 4 mg  4 mg Oral Q6H PRN Margart Sickles, PA-C       Or  . ondansetron (ZOFRAN) injection 4 mg  4 mg Intravenous Q6H PRN Joshua Chadwell, PA-C      .  senna-docusate (Senokot-S) tablet 1 tablet  1 tablet Oral QHS PRN Margart Sickles, PA-C   1 tablet at 10/07/15 1045  . sodium chloride 0.9 % injection 3 mL  3 mL Intravenous Q12H Lorretta Harp, MD   3 mL at 10/09/15 1001  . sodium phosphate (FLEET) 7-19 GM/118ML enema 1 enema  1 enema Rectal Once PRN Margart Sickles, PA-C         Discharge Medications: Please see discharge summary for a list of discharge medications.  Relevant Imaging Results:  Relevant Lab Results:   Additional Information SS#: [id number]  Leron Croak

## 2015-10-10 NOTE — Progress Notes (Signed)
Patient discharged to SNF in stable condition.  

## 2015-10-10 NOTE — Care Management Important Message (Signed)
Important Message  Patient Details  Name: Kristopher [name] MRN: [id number] Date of Birth: 1932-08-27   Medicare Important Message Given:  Yes    Sidharth Leverette P Seydina Holliman 10/10/2015, 4:24 PM

## 2015-10-10 NOTE — Progress Notes (Addendum)
[facility]   CSW will speak with the Pt/family concerning approval for return.   Pt nurse stated that family wanted Hospice to follow onced discharged.   Pt can go to private room on Friday.    CSW updated MD.    Leron Croakassandra Domanique Huesman LCSWA  [place] [phone]

## 2015-10-10 NOTE — Progress Notes (Signed)
Nutrition Brief Note  Chart reviewed. Pt now transitioning to comfort care.  No further nutrition interventions warranted at this time.  Please re-consult as needed.   Josemanuel Eakins A. Anyeli Hockenbury, RD, LDN, CDE Pager: [phone] After hours Pager: [phone]  

## 2015-10-10 NOTE — Progress Notes (Addendum)
[facility]   Pt and family agreeable. Confirmed plans with facility.  Plan transfer via EMS.    Leron Croakassandra Wenceslaus Gist LCSWA  [facility]  [phone]

## 2015-10-10 NOTE — Progress Notes (Signed)
Spoke with pt's daughter who informs me that family would like [facility]. CSW Cassandra notified.

## 2015-10-10 NOTE — Discharge Summary (Addendum)
Physician Discharge Summary  Kristopher [name] RUE:[id number] DOB: 05-26-1932 DOA: 10/04/2015  PCP: Kristopher Otto, MD  Admit date: 10/04/2015 Discharge date: 10/10/2015  Time spent: Greater than 30 minutes  Recommendations for Outpatient Follow-up:  1. M.D. at SNF in 1-2 days or as soon as possible. 2. Recommend either hospice or palliative care consultation and follow-up at SNF. 3. Dr. Frederico Hamman, orthopedics-operating surgeon. Consult if needed.  Discharge Diagnoses:  Principal Problem:   Closed fracture of right hip (HCC) Active Problems:   History of coronary artery bypass graft   History of GI bleed   Paroxysmal atrial fibrillation (HCC)   Personal history of arterial venous malformation (AVM)   Hip fracture (HCC)   Fall   Essential hypertension   Anterior displaced fracture of proximal clavicle with delayed healing   Dementia due to general medical condition without behavioral disturbance   Protein-calorie malnutrition, severe (HCC)   Pneumothorax on right   SDH (subdural hematoma) (HCC)   BPH (benign prostatic hyperplasia)   GERD (gastroesophageal reflux disease)   Gout   Depression   Discharge Condition: Improved & Stable  Diet recommendation: Comfort feeds.  Filed Weights   10/05/15 2030  Weight: 51.256 kg (113 lb)    History of present illness:  82 y.o. male with PMH of dementia, frequent fall, multiple bone fracture in the past, CAD, S/P CABG, atrial fibrillation not on anticoagulants, BPH, stroke, GI bleeding, pneumothorax, GERD, gout, depression, hypertension, who presents with right hip pain after fall. Work up was significant for subdural hematoma (no surgical intervention required by neurosurgery) & right hip fracture. Patient was transferred from [facility] on 1/11 and underwent right hip surgery (IM nail fixation). Postoperative course complicated by mental status changes/sedation, hypotension, ileus and acute kidney injury. After extensive and multiple  discussions with family, family have finally transitioned patient to complete comfort care.  Hospital Course:   Closed fracture of right hip (HCC) - S/p IM nail  - Discussed with patient's PCP at family's request and with family at length regarding DVT prophylaxis. Family prefers to avoid any form of blood thinners (antiplatelets, Coumadin, Lovenox or NOAC's) after weighing the risks of bleeding Vs benefits of preventing clot/DVT given patient's prior history of GI bleed etc. PCP agrees and recommends continued compression stockings   Postop ileus, persisting - Noted abdominal distention and tachypnea related to same on 1/15. X-rays confirm ileus.  - Tried Dulcolax suppository 1 without improvement. Family does not want to pursue NG tube insertion. - Abdomen seems more distended today than yesterday. Family have transitioned patient to full comfort care.  SDH (subdural hematoma) (HCC):  - has bilateral subacute/chronic SDH with miniscule areas of superimposed acute hemorrhage, - neurosurgery consulted by ED. Dr. Conchita Paris, no need for surgery. Pt can be followed up in his office in about 2 weeks for f/u of the SDH [facility] [phone]). - Neurosurgery input appreciated. CT head does not demonstrate any enlargement of the minimally acute component of SDH. He has stable bilateral subdural fluid collections without any appreciable mass effect.  CAD - s/p of CABG: No CP. - no ASA due to SDH  PAF - CHA2DS2-VASc Score is 5, not on anticoagulation secondary to multiple falls, GI bleed, and currently has subdural hematoma  - Cardizem discontinued on 1/15 secondary to severe persistent hypotension. Now due to comfort care option, discontinued all medications that were nonessential to comfort. Family aware.  Hypertension/hypotension - On 1/15, patient had profound and persistent hypotension which  eventually improved after a couple of boluses of IV normal saline.  Cardizem discontinued.  Severe protein calorie malnutrition - Comfort feeds of choice.  Dementia/acute encephalopathy - Patient was somnolent on 1/13-likely secondary to polypharmacy including pain medications. These were minimized. Mental status has improved since 1/14 but continues to wax and wane.  Dysphagia - Speech therapy has evaluated today and recommend dysphagia 1 diet and nectar thickened liquids. NPO today d/t ileus.  BPH - Continue Flomax and proscar  Gout:  - continue Aloopurinol  Depression - Zoloft was held due to drowsiness-continue to hold for now.  Acute blood loss anemia and mild thrombocytopenia - Secondary to postoperative blood loss. Follow CBC in a.m. Hemoglobin stable in the mid 8 g per DL range.  Hypernatremia and acute kidney injury - Creatinine has jumped up from 0.94 yesterday to 4.99 today. This may be related to acute tubular necrosis from persistent hypotension yesterday. As stated above, family is aware of this new development and have transitioned patient to full comfort care.  Adult failure to thrive - Multifactorial secondary to advanced dementia and acute illness.  - Discussed at length with patient's 2 daughters Ms Cordelia Pen Self & Ms. Meriam Sprague at bedside today. Updated overall lack of progress and actual deterioration (new hypernatremia and acute renal failure and persisting ileus) and poor prognosis. After careful consideration over the last several days, they feel comfortable and have decided to make him full comfort care. They were initially thinking of pursuing placement at the residential hospice but have finally decided to return to prior SNF with hospice or palliative care following.   DO NOT RESUSCITATE   Consultants:  Orthopedics  Discussed with neurosurgery 1/12  Procedures:  Right hip surgery with IM nail fixation on 1/11  Antimicrobials:  None   Discharge Exam:  Complaints: Nonverbal. Staring blankly.  Filed  Vitals:   10/09/15 1647 10/09/15 1800 10/09/15 2315 10/10/15 0642  BP: 60/40 62/41 92/52  107/60  Pulse: 98  88 92  Temp:   98.9 F (37.2 C) 97.8 F (36.6 C)  TempSrc:   Axillary Axillary  Resp:   21 17  Height:      Weight:      SpO2: 93%  92% 92%    General exam: elderly frail chronically ill-looking male lying propped up in bed and in no obvious distress.Marland Kitchen  Respiratory system: Clear/ poor inspiratory effort. . No increased work of breathing. Cardiovascular system: S1 & S2 heard, RRR. No JVD, murmurs, gallops, clicks or pedal edema. Gastrointestinal system: Abdomen is more distended, nontender with paucity of bowel sounds. Condom cath.  Central nervous system: somnolent but easily arousable and stares blankly. Nonverbal. No focal neurological deficits. Extremities: Symmetric 5 x 5 power.Right hip surgical site dressing clean and dry.  Discharge Instructions      Discharge Instructions    Call MD for:  difficulty breathing, headache or visual disturbances    Complete by:  As directed      Call MD for:  persistant nausea and vomiting    Complete by:  As directed      Call MD for:  redness, tenderness, or signs of infection (pain, swelling, redness, odor or green/yellow discharge around incision site)    Complete by:  As directed      Call MD for:  severe uncontrolled pain    Complete by:  As directed      Call MD for:  temperature >100.4    Complete by:  As directed  Discharge instructions    Complete by:  As directed   Comfort feeds.     Increase activity slowly    Complete by:  As directed      Weight bearing as tolerated    Complete by:  As directed   Minimal ambulator, wbat mainly for transfers  Laterality:  right  Extremity:  Lower            Medication List    STOP taking these medications        allopurinol 300 MG tablet  Commonly known as:  ZYLOPRIM     diltiazem 120 MG 24 hr capsule  Commonly known as:  CARDIZEM CD     donepezil 5 MG tablet   Commonly known as:  ARICEPT     feeding supplement (ENSURE ENLIVE) Liqd     finasteride 5 MG tablet  Commonly known as:  PROSCAR     guaiFENesin 600 MG 12 hr tablet  Commonly known as:  MUCINEX     levalbuterol 0.63 MG/3ML nebulizer solution  Commonly known as:  XOPENEX     montelukast 10 MG tablet  Commonly known as:  SINGULAIR     pantoprazole 40 MG tablet  Commonly known as:  PROTONIX     sertraline 50 MG tablet  Commonly known as:  ZOLOFT     tamsulosin 0.4 MG Caps capsule  Commonly known as:  FLOMAX     traMADol-acetaminophen 37.5-325 MG tablet  Commonly known as:  ULTRACET     TYLENOL ARTHRITIS PAIN 650 MG CR tablet  Generic drug:  acetaminophen      TAKE these medications        albuterol (2.5 MG/3ML) 0.083% nebulizer solution  Commonly known as:  PROVENTIL  Take 2.5 mg by nebulization every 6 (six) hours as needed for wheezing or shortness of breath.     morphine CONCENTRATE 10 MG/0.5ML Soln concentrated solution  Take 0.25 mLs (5 mg total) by mouth every 3 (three) hours as needed for moderate pain, severe pain, anxiety or shortness of breath.       Follow-up Information    Follow up with CAFFREY JR,W D, MD. Schedule an appointment as soon as possible for a visit in 2 weeks.   Specialty:  Orthopedic Surgery   Contact information:   [address] [place] [address] [phone]        The results of significant diagnostics from this hospitalization (including imaging, microbiology, ancillary and laboratory) are listed below for reference.    Significant Diagnostic Studies: Dg Chest 1 View  10/04/2015  CLINICAL DATA:  Fall.  Right hip fracture. EXAM: CHEST 1 VIEW COMPARISON:  09/10/2015 FINDINGS: Previous median sternotomy and CABG procedure. Aortic atherosclerosis noted. The heart size and mediastinal contours are within normal limits. Both lungs are clear. The visualized skeletal structures are unremarkable. IMPRESSION: No  active disease. Electronically Signed   By: Signa Kellaylor  Stroud M.D.   On: 10/04/2015 19:09   Ct Head Wo Contrast  10/06/2015  CLINICAL DATA:  Follow-up subdural hematomas. EXAM: CT HEAD WITHOUT CONTRAST TECHNIQUE: Contiguous axial images were obtained from the base of the skull through the vertex without intravenous contrast. COMPARISON:  10/04/2015 head CT. FINDINGS: Re- demonstrated are bilateral subdural hematomas throughout the bilateral cerebral convexities, left slightly greater than right, not appreciably changed (for example the left subdural fluid collection measures 12 mm in thickness in the right subdural fluid collection measures 8 mm in thickness on series 201/image 21, unchanged  since 10/04/2015 using similar measurement technique at similar level). No new fluid hematocrit levels within the subdural collections. Scattered tiny hyperdensities in the left subdural hematoma are unchanged. No evidence of subarachnoid hemorrhage or parenchymal hemorrhage. No mass lesion or midline shift. Basilar cisterns remain patent. No CT evidence of acute infarction. Stable diffuse cerebral volume loss. Intracranial atherosclerosis. Nonspecific stable mild subcortical and periventricular white matter hypodensity, most in keeping with chronic small vessel ischemic change. Ventricles are stable and within normal limits. New mucous retention cyst in the basilar right maxillary sinus. Mild mucosal thickening in the right maxillary sinus. The mastoid air cells are unopacified. No evidence of calvarial fracture. IMPRESSION: 1. Stable bilateral subdural hematomas, left slightly greater than right. No evidence of interval hemorrhage. No midline shift. Basilar cisterns remain patent. 2. No CT evidence of acute infarction. Stable intracranial atherosclerosis, diffuse cerebral volume loss and chronic small vessel ischemic white matter change. 3. New mucous retention cyst in the right maxillary sinus. Electronically Signed   By:  Delbert Phenix M.D.   On: 10/06/2015 18:13   Ct Head Wo Contrast  10/04/2015  CLINICAL DATA:  Fall at nursing home with acute right hip fracture. Initial encounter. EXAM: CT HEAD WITHOUT CONTRAST CT CERVICAL SPINE WITHOUT CONTRAST TECHNIQUE: Multidetector CT imaging of the head and cervical spine was performed following the standard protocol without intravenous contrast. Multiplanar CT image reconstructions of the cervical spine were also generated. COMPARISON:  09/07/2015 FINDINGS: CT HEAD FINDINGS Since the prior study, the patient has developed bilateral subdural fluid collections overlying the convexities. The left-sided collection may be slightly larger than the left and measures approximately 12 mm in thickness. There are several foci of increased density in both subdural collections consistent with more acute age blood. For the most part these subdural collections are lower density and likely consistent with older blood product. No significant mass effect is identified. Stable atrophy and small vessel disease present. No hydrocephalus or acute infarction. No skull fracture identified. CT CERVICAL SPINE FINDINGS The cervical spine shows normal alignment. There is no evidence of acute fracture or subluxation. No soft tissue swelling or hematoma is identified. Stable advanced spondylosis of the cervical spine with near complete loss of disc space height at C4-5, partially fused C5-6 level and moderate disc disease at C3-4 and C6-7. No bony or soft tissue lesions are seen. The visualized airway is normally patent. Previous displaced medial right clavicle fracture shows poor healing since the prior CT with some interval callus formation. Residual deformity is present. Previously noted right apical pneumothorax has resolved. IMPRESSION: 1. Development of bilateral subdural fluid collections overlying the convexities, slightly larger on the left, consistent with interval subdural hemorrhages. There are several foci  of higher density in these collections suggestive of more acute aged blood. Subdural collections are not causing significant mass effect. 2. No evidence of acute cervical spine fracture. Stable advanced cervical disc disease. 3. Poor healing of displaced medial right clavicular fracture since prior CT. 4. Resolution of previously visualized right apical pneumothorax. 5. These results were called by telephone at the time of interpretation on 10/04/2015 at 8:20 pm to Dr. Linwood Dibbles , who verbally acknowledged these results. Electronically Signed   By: Irish Lack M.D.   On: 10/04/2015 20:24   Ct Cervical Spine Wo Contrast  10/04/2015  CLINICAL DATA:  Fall at nursing home with acute right hip fracture. Initial encounter. EXAM: CT HEAD WITHOUT CONTRAST CT CERVICAL SPINE WITHOUT CONTRAST TECHNIQUE: Multidetector CT imaging of the  head and cervical spine was performed following the standard protocol without intravenous contrast. Multiplanar CT image reconstructions of the cervical spine were also generated. COMPARISON:  09/07/2015 FINDINGS: CT HEAD FINDINGS Since the prior study, the patient has developed bilateral subdural fluid collections overlying the convexities. The left-sided collection may be slightly larger than the left and measures approximately 12 mm in thickness. There are several foci of increased density in both subdural collections consistent with more acute age blood. For the most part these subdural collections are lower density and likely consistent with older blood product. No significant mass effect is identified. Stable atrophy and small vessel disease present. No hydrocephalus or acute infarction. No skull fracture identified. CT CERVICAL SPINE FINDINGS The cervical spine shows normal alignment. There is no evidence of acute fracture or subluxation. No soft tissue swelling or hematoma is identified. Stable advanced spondylosis of the cervical spine with near complete loss of disc space height  at C4-5, partially fused C5-6 level and moderate disc disease at C3-4 and C6-7. No bony or soft tissue lesions are seen. The visualized airway is normally patent. Previous displaced medial right clavicle fracture shows poor healing since the prior CT with some interval callus formation. Residual deformity is present. Previously noted right apical pneumothorax has resolved. IMPRESSION: 1. Development of bilateral subdural fluid collections overlying the convexities, slightly larger on the left, consistent with interval subdural hemorrhages. There are several foci of higher density in these collections suggestive of more acute aged blood. Subdural collections are not causing significant mass effect. 2. No evidence of acute cervical spine fracture. Stable advanced cervical disc disease. 3. Poor healing of displaced medial right clavicular fracture since prior CT. 4. Resolution of previously visualized right apical pneumothorax. 5. These results were called by telephone at the time of interpretation on 10/04/2015 at 8:20 pm to Dr. Linwood Dibbles , who verbally acknowledged these results. Electronically Signed   By: Irish Lack M.D.   On: 10/04/2015 20:24   Dg Chest Port 1 View  10/09/2015  CLINICAL DATA:  Shortness of breath. Postop from right hip fracture fixation. Coronary artery disease. EXAM: PORTABLE CHEST 1 VIEW COMPARISON:  10/04/2015 FINDINGS: Heart size is normal. Low lung volumes are seen with mild bibasilar atelectasis. No evidence pulmonary consolidation or edema. No evidence of pneumothorax or pleural effusion. Prior CABG noted. IMPRESSION: Low lung volumes with mild bibasilar atelectasis. Electronically Signed   By: Myles Rosenthal M.D.   On: 10/09/2015 11:42   Dg Abd Portable 1v  10/09/2015  CLINICAL DATA:  Ileus. Internal fixation of hip fracture 10/04/2015. EXAM: PORTABLE ABDOMEN - 1 VIEW COMPARISON:  06/04/2015 and CT 09/07/2015 FINDINGS: Examination demonstrates multiple air-filled loops of large and  small bowel with dilatation of several small bowel loops measuring up to the 6 cm in diameter. Air and stool present over the rectum. Findings likely secondary to postoperative ileus. No definite free peritoneal air. Evidence of known bilateral nephrolithiasis. There are degenerative changes of the spine. Severe degenerate change of the left hip. IMPRESSION: Multiple air-filled dilated large and small bowel loops likely postoperative ileus. These results will be called to the ordering clinician or representative by the Radiologist Assistant, and communication documented in the PACS or zVision Dashboard. Electronically Signed   By: Elberta Fortis M.D.   On: 10/09/2015 11:50   Dg Hip Operative Unilat With Pelvis Right  10/05/2015  CLINICAL DATA:  Status post ORIF of right hip fracture. EXAM: OPERATIVE right HIP (WITH PELVIS IF PERFORMED) 2 VIEWS  TECHNIQUE: Fluoroscopic spot image(s) were submitted for interpretation post-operatively. COMPARISON:  10/04/2015 FINDINGS: Intra medullary rod and screw device is identified reducing the comminuted intertrochanteric fracture of the proximal right femur. The distal and of the medullary rod is not visualized. The hardware components are in anatomic alignment. IMPRESSION: Status post ORIF of proximal right femur fracture. Electronically Signed   By: Signa Kell M.D.   On: 10/05/2015 15:26   Dg Hip Unilat  With Pelvis 2-3 Views Right  10/04/2015  CLINICAL DATA:  Fall with right hip pain.  Initial encounter. EXAM: DG HIP (WITH OR WITHOUT PELVIS) 2-3V RIGHT COMPARISON:  None. FINDINGS: Acute and comminuted fracture of the right hip is present which appears to be primarily subtrochanteric. Intertrochanteric component also suspected with lucencies extending into the greater and lesser trochanters. The femoral neck appears intact. No evidence of dislocation. No pelvic fracture or diastasis identified. IMPRESSION: Acute fracture of the right hip which appears to be  predominantly subtrochanteric in nature. Intertrochanteric component also suspected. Electronically Signed   By: Irish Lack M.D.   On: 10/04/2015 19:15    Microbiology: No results found for this or any previous visit (from the past 240 hour(s)).   Labs: Basic Metabolic Panel:  Recent Labs Lab 10/04/15 2037 10/05/15 0448 10/06/15 0444 10/07/15 0630 10/09/15 1840  NA 138 142 141 142 148*  K 3.9 3.7 3.7 3.7 4.7  CL 103 107 108 108 112*  CO2 22 27 28 28  16*  GLUCOSE 123* 115* 119* 89 109*  BUN 20 16 9 16  98*  CREATININE 0.97 0.71 0.68 0.94 4.99*  CALCIUM 9.1 8.6* 8.4* 8.6* 7.9*   Liver Function Tests:  Recent Labs Lab 10/05/15 0448  AST 21  ALT 20  ALKPHOS 146*  BILITOT 1.3*  PROT 5.6*  ALBUMIN 3.3*   No results for input(s): LIPASE, AMYLASE in the last 168 hours. No results for input(s): AMMONIA in the last 168 hours. CBC:  Recent Labs Lab 10/04/15 2037 10/05/15 0448 10/06/15 0444 10/07/15 0630 10/08/15 0501 10/09/15 1840  WBC 10.4 10.2 8.6 9.2 10.6* 11.4*  NEUTROABS 9.0*  --   --   --   --   --   HGB 12.2* 9.8* 8.6* 8.7* 8.7* 8.7*  HCT 36.8* 29.3* 26.1* 26.1* 26.4* 27.0*  MCV 97.9 96.7 97.0 98.9 98.5 98.2  PLT 274 184 142* 144* 191 196   Cardiac Enzymes: No results for input(s): CKTOTAL, CKMB, CKMBINDEX, TROPONINI in the last 168 hours. BNP: BNP (last 3 results) No results for input(s): BNP in the last 8760 hours.  ProBNP (last 3 results) No results for input(s): PROBNP in the last 8760 hours.  CBG:  Recent Labs Lab 10/05/15 0738 10/09/15 0752  GLUCAP 100* 133*       Signed:  Marcellus Scott, MD, FACP, FHM. [facility] Pager [phone]  If 7PM-7AM, please contact night-coverage [email or web address] Password [email or web address] 10/10/2015, 3:00 PM

## 2015-10-10 NOTE — Clinical Social Work Note (Signed)
Clinical Social Work Assessment  Patient Details  Name: Kristopher [name] MRN: [id number] Date of Birth: [birth year]  Date of referral:  10/06/15               Reason for consult:  Facility Placement                Permission sought to share information with:  Family Supports, Magazine features editoracility Contact Representative Permission granted to share information::   (Advanced Directives named daughters as Product managerHCPOA- pt disoriented)  Name::     [name] Self  Agency::  [facility]  Relationship::  dtr  Contact Information:  Marlane MingleSherry Self Dtr  [phone]  Housing/Transportation Living arrangements for the past 2 months:  Skilled Nursing Facility Source of Information:  Adult Children, Power of [employer] Patient Interpreter Needed:  None Criminal Activity/Legal Involvement Pertinent to Current Situation/Hospitalization:  No - Comment as needed Significant Relationships:  Adult Children Lives with:  Facility Resident Do you feel safe going back to the place where you live?  Yes Need for family participation in patient care:  Yes (Comment) (decision making)  Care giving concerns: Pt's daughter has no concerns at this time.    Social Worker assessment / plan: CSW spoke with Pt's daughter Marlane MingleSherry Self [phone]. CSW asked for verification and received appropriate information. CSW introduced self and that CSW is working with facility and Pt for d/c planning. CSW explained to the Pt's daughter that [facility]. Pt's daughter stated she did speak with facility and that the Pt is going to go back there at d/c. CSW did get approval [facility]. CSW passed along that information to the MD. CSW will continue to work with Pt and family for d[facility] planning [facility].   Employment status:  Retired Health and safety inspectornsurance information:    PT  Recommendations:  Skilled Teacher, early years/preursing Facility Information / Referral to community resources:  Skilled Nursing Facility  Patient/Family's Response to care:  Pt's family stated positive regards towards staff and CSW.   Patient/Family's Understanding of and Emotional Response to Diagnosis, Current Treatment, and Prognosis:  Pt's family have been working to accept Pt's decline and are taking steps to move toward end of life decision. Pt's family is [facility] out [facility] and keeping him in the SNF facility there with palliative following.   Emotional Assessment Appearance:  Appears stated age Attitude/Demeanor/Rapport:  Unable to Assess Affect (typically observed):  Unable to Assess Orientation:  Fluctuating Orientation (Suspected and/or reported Sundowners) Alcohol / Substance use:  Not Applicable Psych involvement (Current and /or in the community):  No (Comment)  Discharge Needs  Concerns to be addressed:  Care Coordination Readmission within the last 30 days:  Yes Current discharge risk:  Physical Impairment Barriers to Discharge:  Continued Medical Work up   [employer] CorporationCassandra Precious Segall 10/10/2015, 3:01 PM

## 2015-10-26 DEATH — deceased

## 2017-07-14 IMAGING — CT CT CERVICAL SPINE W/O CM
3 of 6 series · 10 of 33 positions shown, 11 images · non-contrast
Comparison: 09/06/2015

CLINICAL DATA: Found laying on bathroom floor, laceration along the
right forehead. Fall.

EXAM:
CT HEAD WITHOUT CONTRAST
CT CERVICAL SPINE WITHOUT CONTRAST
TECHNIQUE: Multidetector CT imaging of the head and cervical spine was
performed following the standard protocol without intravenous
contrast. Multiplanar CT image reconstructions of the cervical spine
were also generated.

[Series 5: c_spine 2.0 st · axial · 0.37mm/px · z∈[+1168,+1264]mm · 2 of 96 slices shown, 3 images]
[im 24/96  soft-tissue]
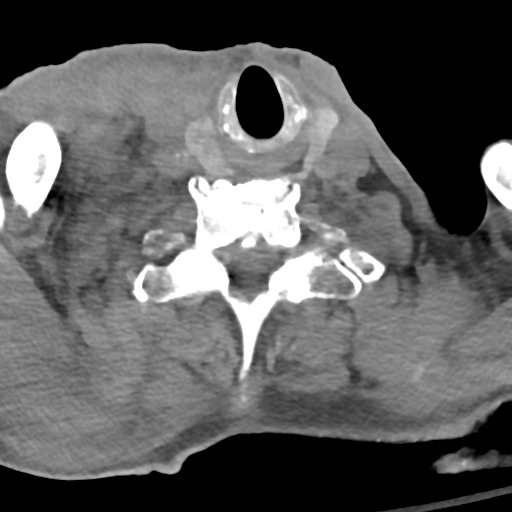
[im 24/96  bone]
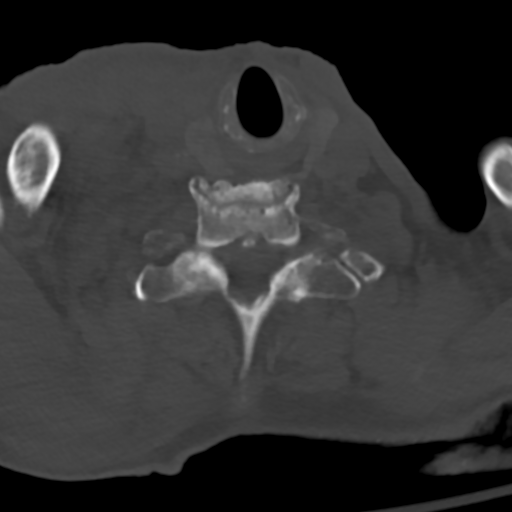
[im 72/96  bone]
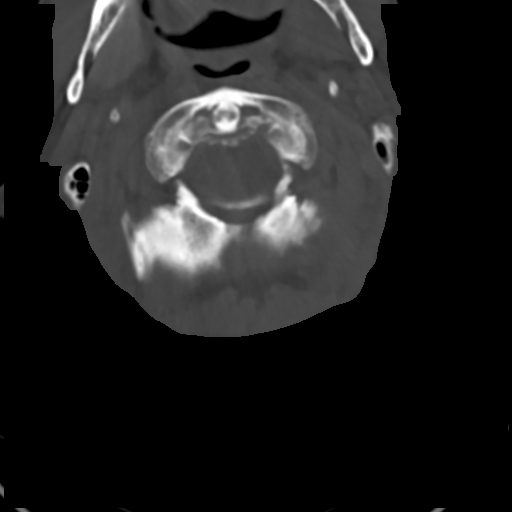

[Series 8: c_spine 2.0 sag bone · sagittal · 0.19mm/px · 5 of 61 slices shown]
[im 21/61  bone]
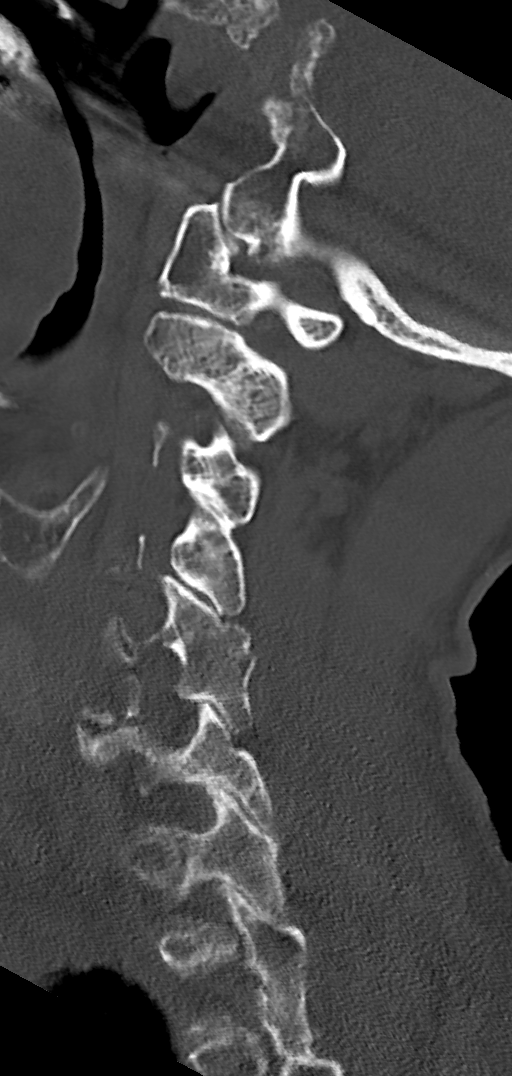
[im 26/61  bone]
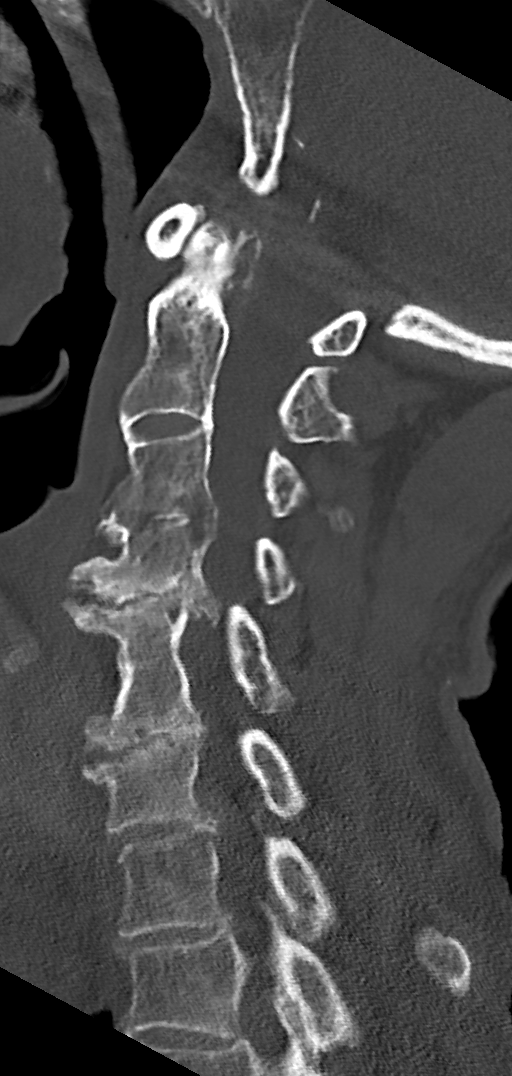
[im 31/61  bone]
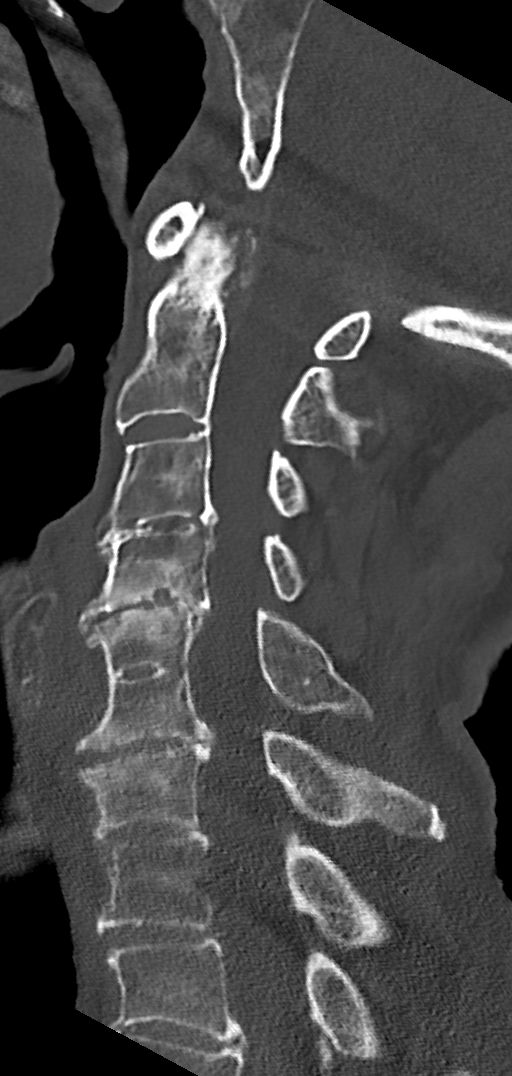
[im 36/61  bone]
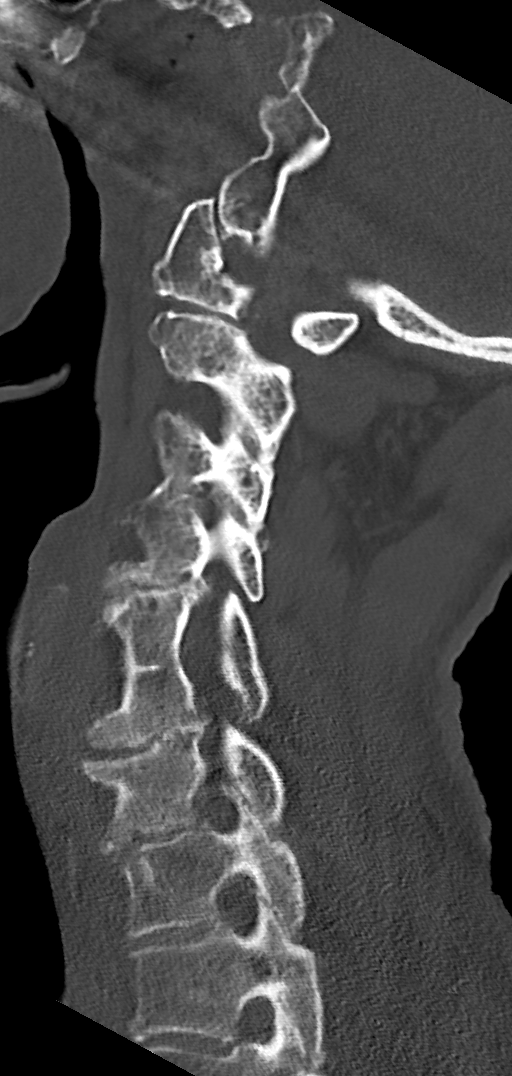
[im 41/61  bone]
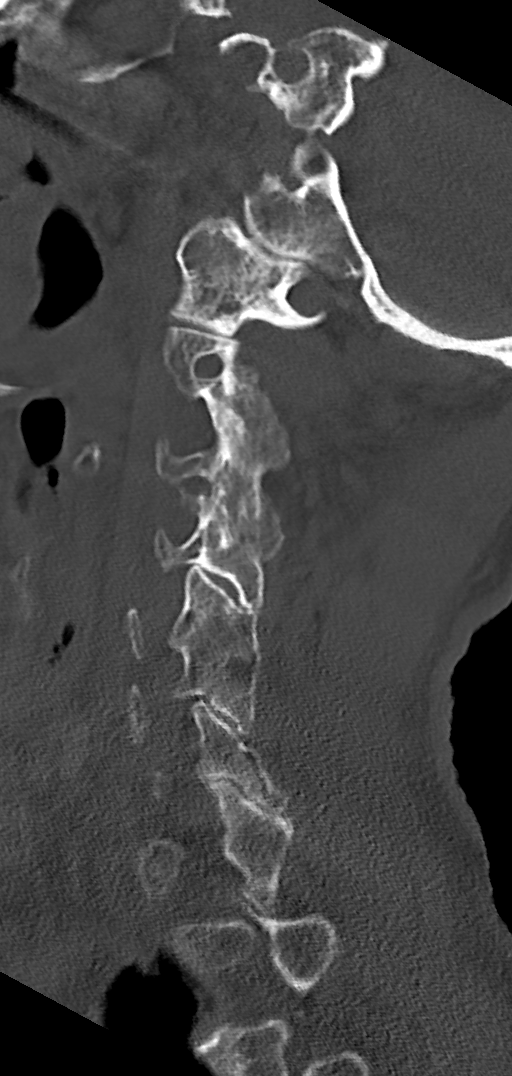

[Series 9: c_spine 2.0 cor bone · coronal · 0.23mm/px · 3 of 50 slices shown]
[im 10/50  bone]
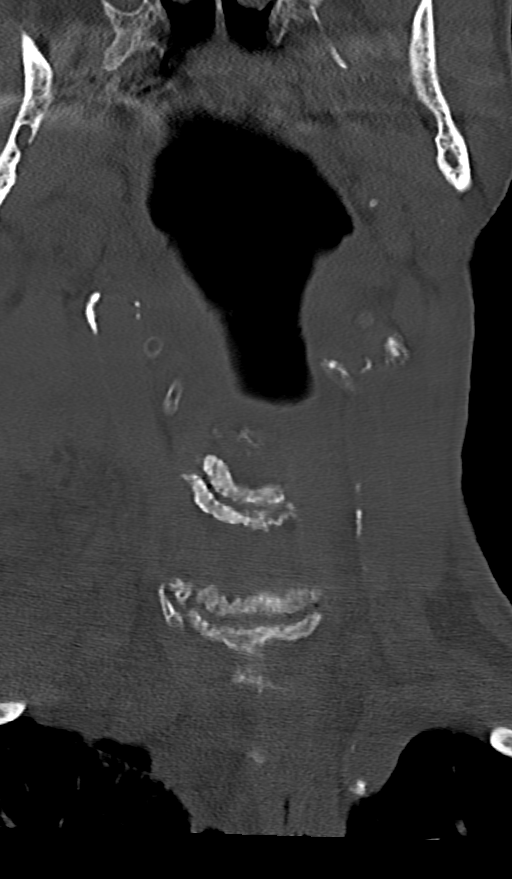
[im 20/50  bone]
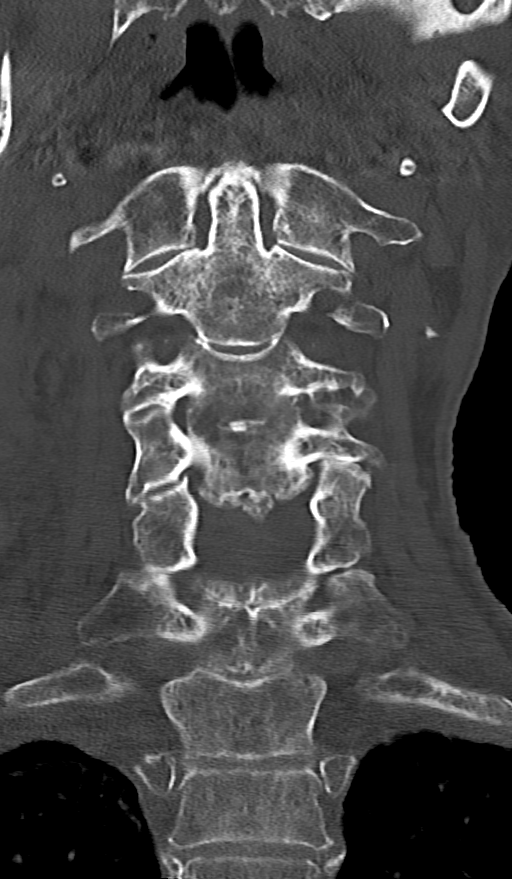
[im 30/50  bone]
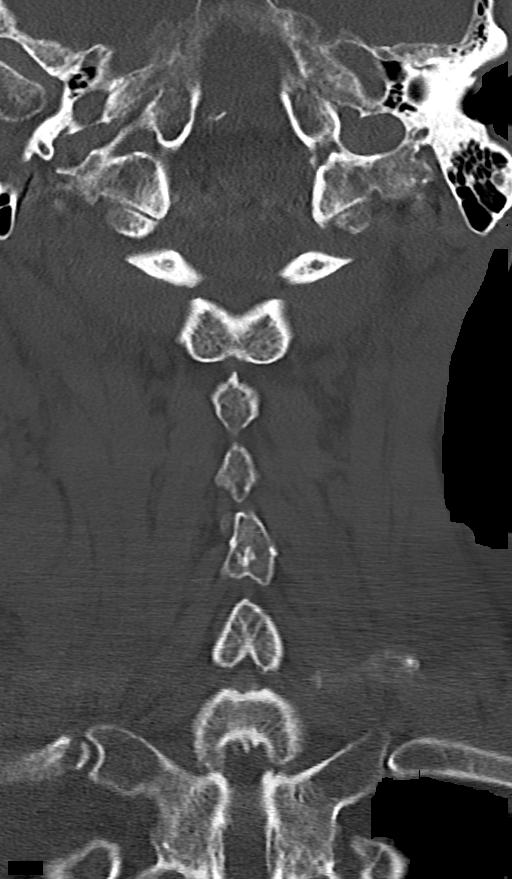

[10 of 33 positions shown; findings below may reference images not displayed]

FINDINGS: CT HEAD FINDINGS

The brainstem, cerebellum, cerebral peduncles, thalami, basal
ganglia, basilar cisterns, and ventricular system appear within
normal limits. Periventricular white matter and corona radiata
hypodensities favor chronic ischemic microvascular white matter
disease. No intracranial hemorrhage, mass lesion, or acute CVA.

Right frontal scalp laceration.

Acute on chronic right maxillary sinusitis with chronic ethmoid and
chronic left maxillary sinusitis.

There is atherosclerotic calcification of the cavernous carotid
arteries bilaterally.

CT CERVICAL SPINE FINDINGS

Calcified pannus posterior to the odontoid. Similar appearance of 3
mm degenerative retrolisthesis at C4-5. Interbody and posterior
element fusion at C5-6 appears congenital.

The left C2- 3 and C3-4 facet joints are fused. There is some
bridging interbody spurring at C3-4.

Degenerative endplate sclerosis at C4-5 and C6-7 both with posterior
osseous ridging.

No cervical spine fracture or acute subluxation is identified. There
is evidence of moderate central narrowing of the thecal sac at C4-5
due to subluxation and spurring. Osseous right foraminal stenosis is
severe at C4-5 and mild to moderate at C3-4. Spurring causes left
foraminal stenosis at C3-4, C4-5, and C6-7.

No prevertebral soft tissue swelling.

There is a small right apical pneumothorax. Fracture the right
second rib medially, observed on image 20 series 8. Displaced medial
clavicular fracture on the right. Based on conventional radiography
today the patient is also thought to have lateral clavicular
fracture on the right as well as a lateral acromial fracture.
IMPRESSION: 1. Small right apical pneumothorax with fracture of the right second
rib medially. Right medial clavicular fracture. Based on today's
conventional radiographs the patient is also thought to have a right
lateral clavicular fracture as well as other right rib fractures
including the right third and fourth ribs.
2. Right frontal scalp laceration.  No acute intracranial findings.
3. Cervical spondylosis and degenerative disc disease with
multilevel impingement, but no acute cervical spine fracture.
These results were called by telephone at the time of interpretation
on 09/07/2015 at [DATE] to Dr. JAMSEEDHU JAGATHISH , who verbally
acknowledged these results.

## 2017-07-15 IMAGING — DX DG CLAVICLE*R*
2 series · 2 of 2 positions shown · non-contrast
Comparison: 09/08/2015.

CLINICAL DATA: Clavicle fracture.

EXAM:
RIGHT CLAVICLE - 2+ VIEWS

[clavicle ap]
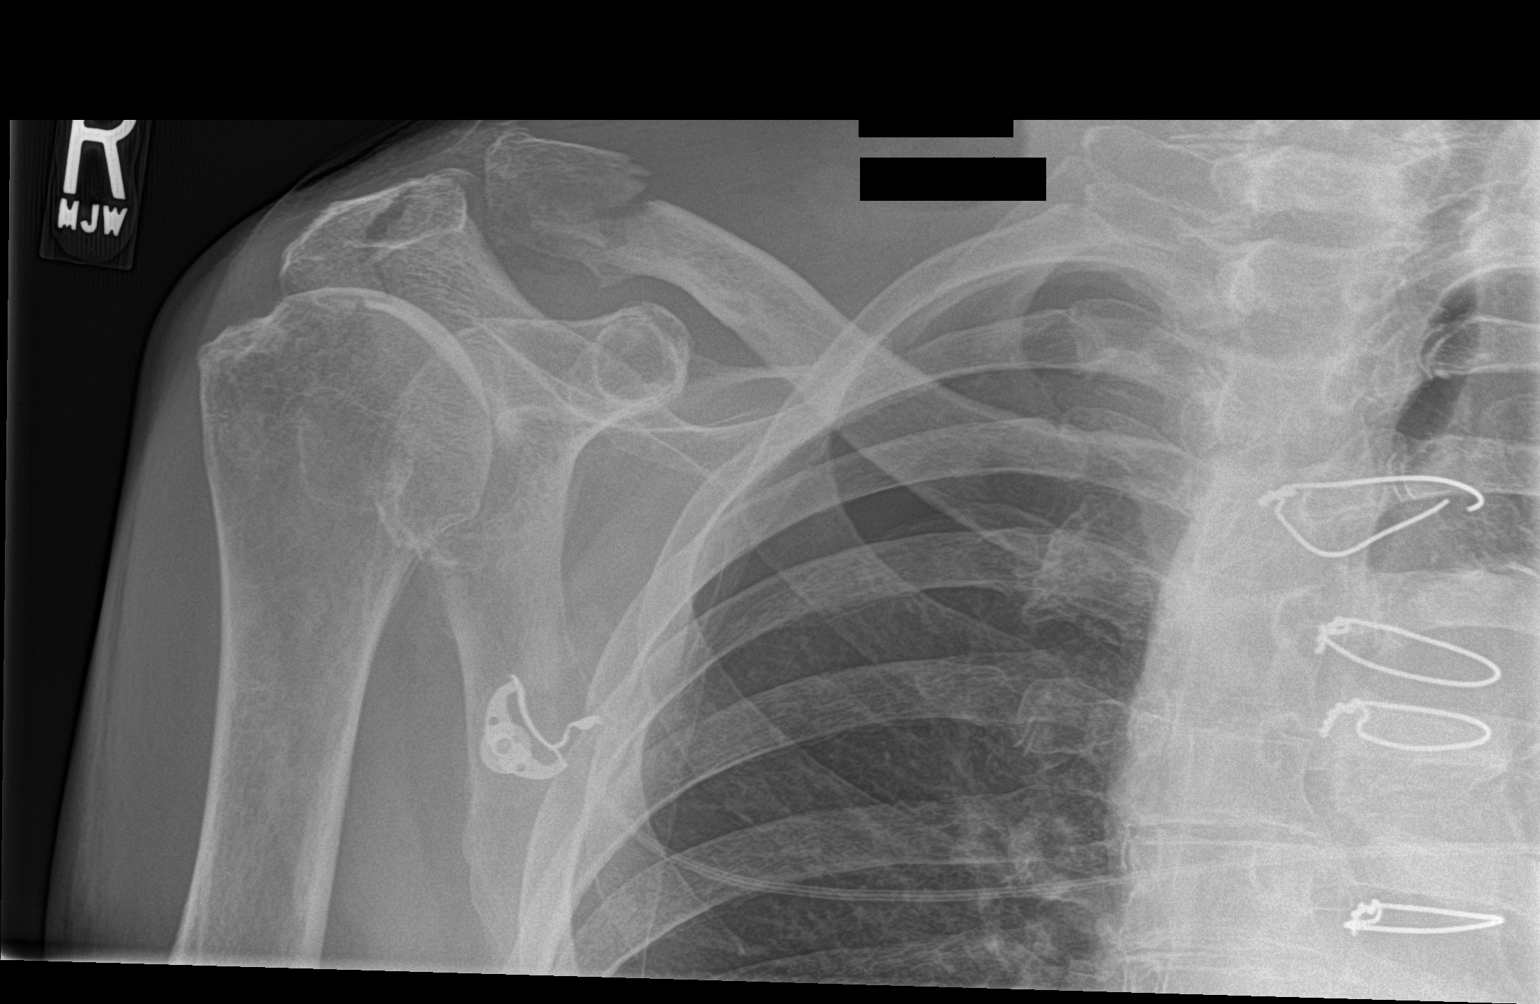

[clavicle axial]
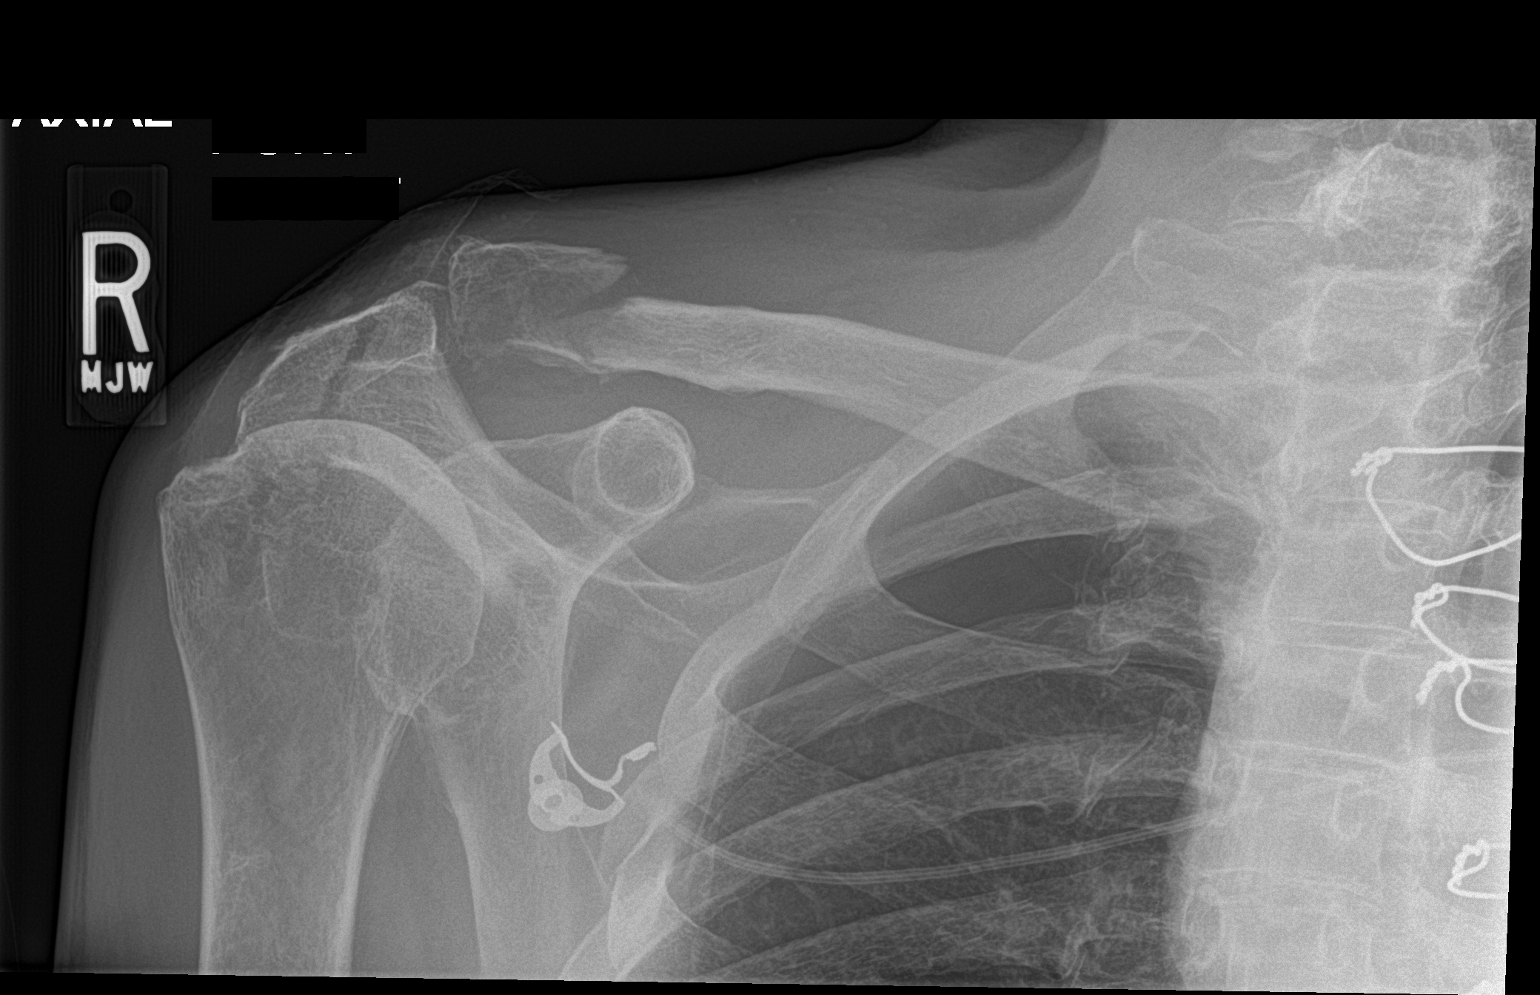

[2 of 2 positions shown; findings below may reference images not displayed]

FINDINGS: Slightly displaced fracture of the distal clavicle is noted.
Fracture of the acromion process is noted.Moderate size right apical
pneumothorax noted. Displaced fracture of the posterior lateral
aspect of the right fourth rib noted. Prior median sternotomy .
IMPRESSION: 1. Slightly displaced fracture of the distal clavicle.

2.  Fracture of the acromion process noted.

3. Displaced fracture of the posterior lateral aspect of the right
fourth rib noted.

4.  Moderate size right apical pneumothorax.

Critical Value/emergent results were called by telephone at the time
of interpretation on 09/08/2015 at [DATE] to nurse Ani who
verbally acknowledged these results.

## 2017-07-16 IMAGING — DX DG CHEST 1V PORT
1 series · 1 of 1 positions shown · non-contrast
Comparison: September 08, 2015.

CLINICAL DATA: Pneumothorax.  History of atrial fibrillation

EXAM:
PORTABLE CHEST 1 VIEW

[chest ap]
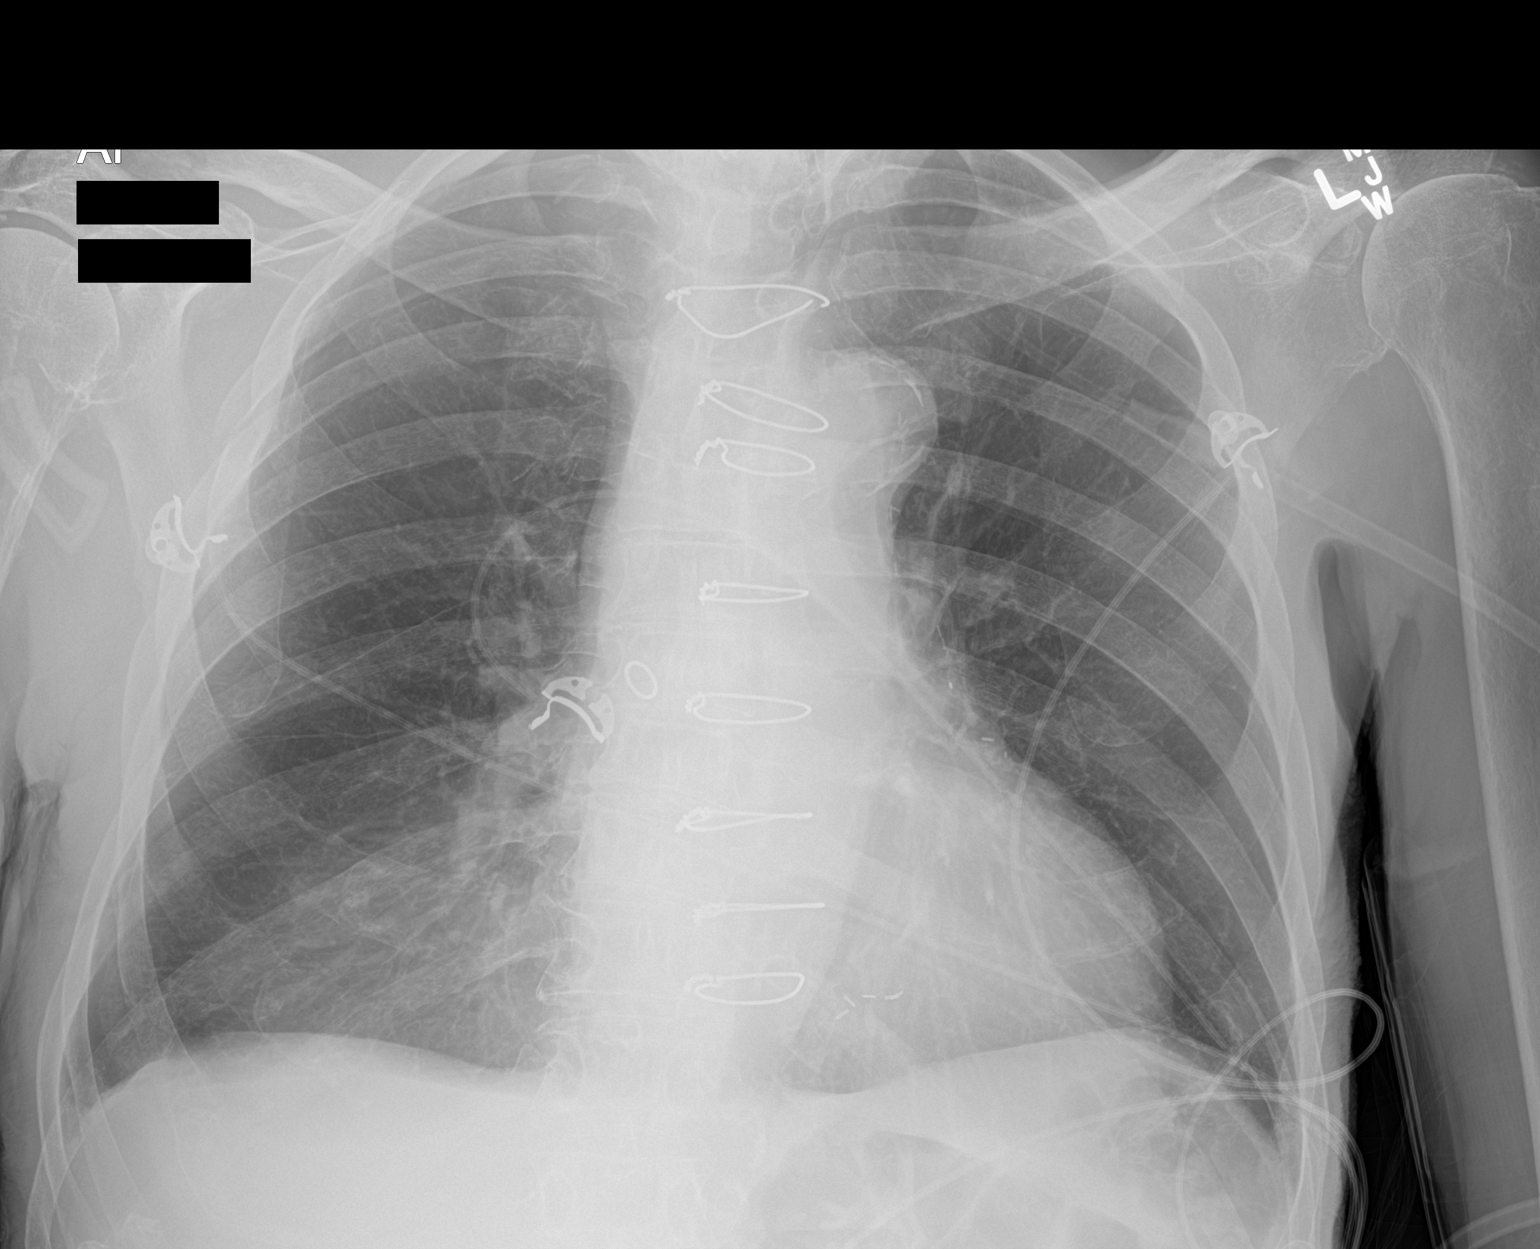

[1 of 1 positions shown; findings below may reference images not displayed]

FINDINGS: The previously noted right apical pneumothorax is unchanged in size
and position. No tension component is appreciable. Fractures of the
lateral right clavicle and right acromion region are stable.
Displaced fracture of the lateral right fourth rib is again noted as
well. There is persistent right base opacity consistent with either
atelectasis or pulmonary contusion, stable. Lungs elsewhere clear.
Heart is upper normal in size with pulmonary vascular within normal
limits. Patient is status post coronary artery bypass grafting. No
adenopathy. There is atherosclerotic calcification in the aorta.
IMPRESSION: Essentially no change from 1 day prior. Stable pneumothorax on the
right without apparent tension component. Atelectasis versus
contusion right base remains stable. Several fractures on the right
appear stable. No new opacity. No change in cardiac silhouette.

## 2017-07-17 IMAGING — CR DG CHEST 1V PORT
1 series · 1 of 1 positions shown · non-contrast
Comparison: Chest radiograph 09/09/2015.

CLINICAL DATA: Patient with history of right-sided pneumothorax.

EXAM:
PORTABLE CHEST 1 VIEW

[AP]
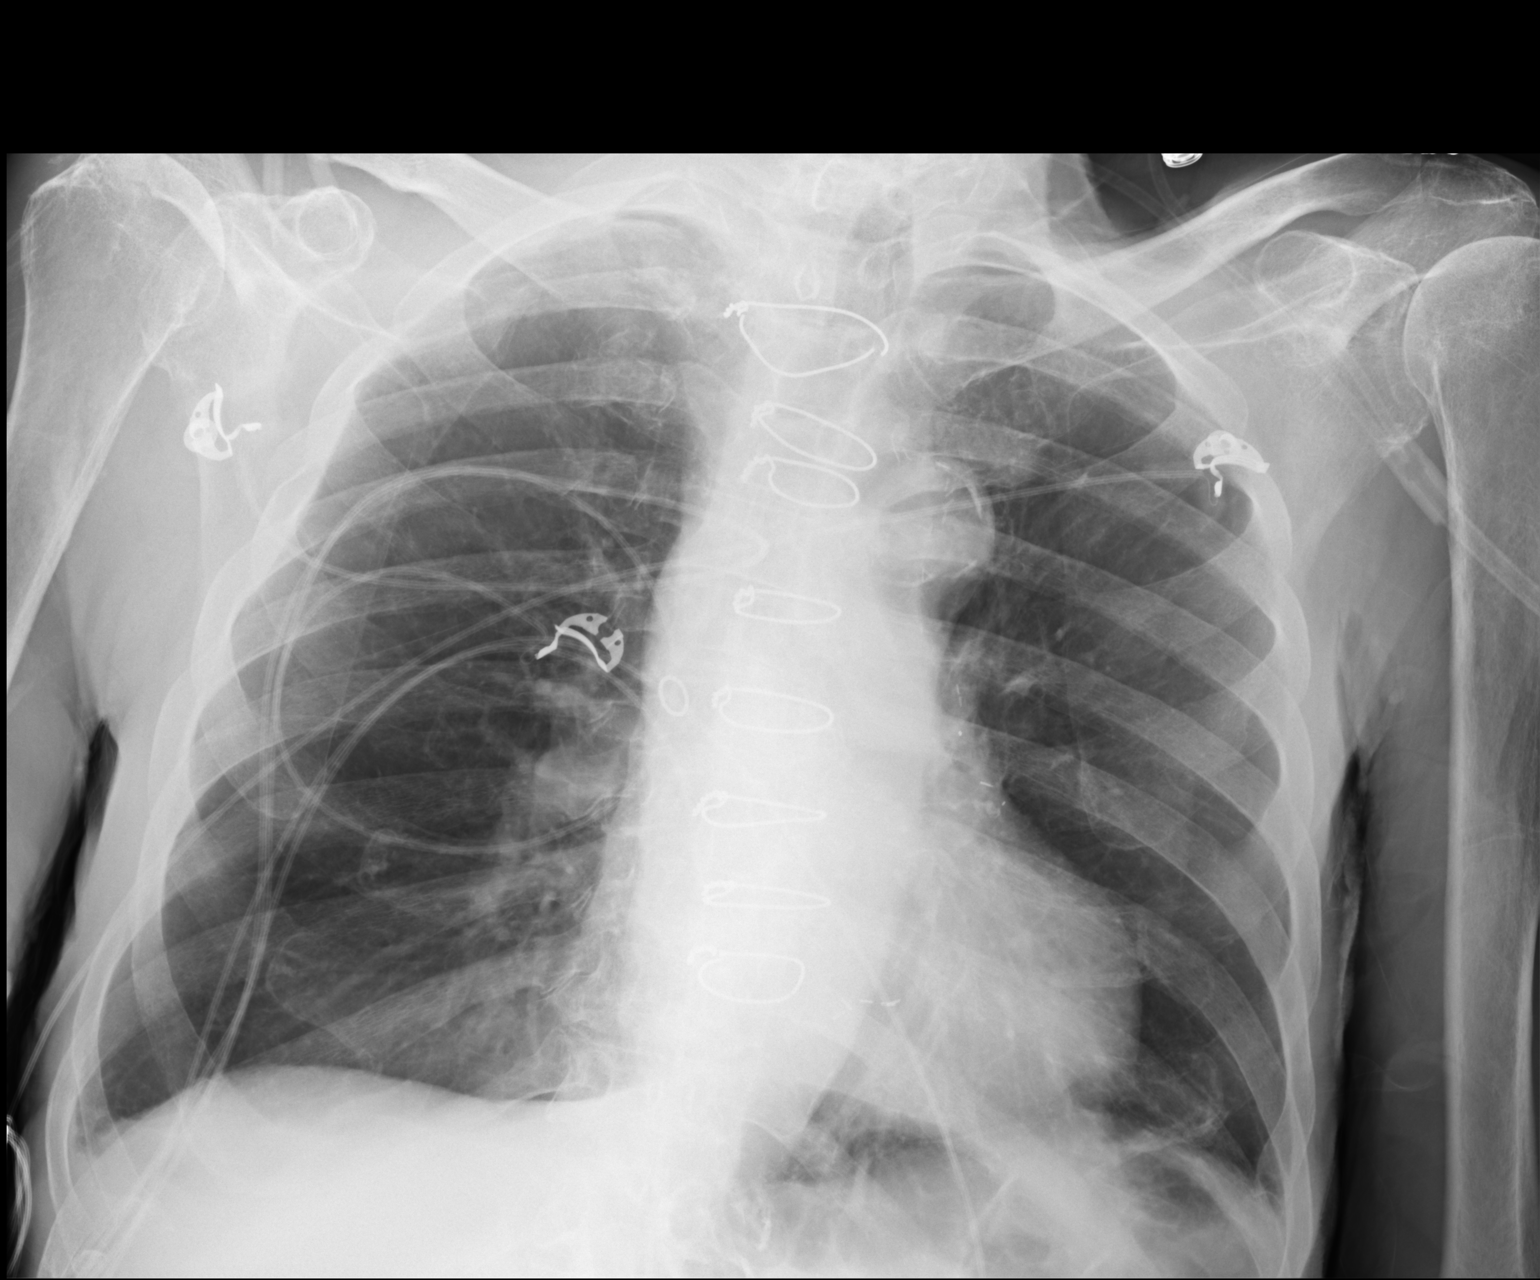

[1 of 1 positions shown; findings below may reference images not displayed]

FINDINGS: Patient is rotated. Stable cardiac and mediastinal contours status
post median sternotomy. No large area of pulmonary consolidation.
Interval decrease in size of now tiny right apical pneumothorax. No
definite pleural effusion. Re- demonstrated right upper lateral rib
fractures and right clavicle fractures.
IMPRESSION: Interval decrease in size of small right apical pneumothorax.

Re- demonstrated medial and distal right clavicle fractures. Re-
demonstrated right rib fractures.
